# Patient Record
Sex: Female | Born: 1935 | Race: White | Hispanic: No | Smoking: Never smoker
Health system: Southern US, Community
[De-identification: ages and names within clinical notes are randomized; demographics above are authoritative.]

## PROBLEM LIST (undated history)

## (undated) DIAGNOSIS — IMO0002 Reserved for concepts with insufficient information to code with codable children: Secondary | ICD-10-CM

## (undated) DIAGNOSIS — N390 Urinary tract infection, site not specified: Secondary | ICD-10-CM

## (undated) DIAGNOSIS — I509 Heart failure, unspecified: Secondary | ICD-10-CM

## (undated) DIAGNOSIS — F0390 Unspecified dementia without behavioral disturbance: Secondary | ICD-10-CM

## (undated) DIAGNOSIS — I10 Essential (primary) hypertension: Secondary | ICD-10-CM

## (undated) DIAGNOSIS — E039 Hypothyroidism, unspecified: Secondary | ICD-10-CM

## (undated) DIAGNOSIS — I2510 Atherosclerotic heart disease of native coronary artery without angina pectoris: Secondary | ICD-10-CM

## (undated) DIAGNOSIS — D509 Iron deficiency anemia, unspecified: Secondary | ICD-10-CM

## (undated) DIAGNOSIS — E785 Hyperlipidemia, unspecified: Secondary | ICD-10-CM

## (undated) DIAGNOSIS — H9190 Unspecified hearing loss, unspecified ear: Secondary | ICD-10-CM

## (undated) DIAGNOSIS — I639 Cerebral infarction, unspecified: Secondary | ICD-10-CM

## (undated) DIAGNOSIS — E1165 Type 2 diabetes mellitus with hyperglycemia: Secondary | ICD-10-CM

## (undated) DIAGNOSIS — R32 Unspecified urinary incontinence: Secondary | ICD-10-CM

## (undated) DIAGNOSIS — M810 Age-related osteoporosis without current pathological fracture: Secondary | ICD-10-CM

## (undated) HISTORY — PX: EYE SURGERY: SHX253

## (undated) HISTORY — DX: Heart failure, unspecified: I50.9

## (undated) HISTORY — PX: BLADDER REPAIR: SHX76

## (undated) HISTORY — DX: Iron deficiency anemia, unspecified: D50.9

## (undated) HISTORY — PX: TONSILLECTOMY AND ADENOIDECTOMY: SUR1326

## (undated) HISTORY — PX: ABDOMINAL HYSTERECTOMY: SHX81

## (undated) HISTORY — PX: INNER EAR SURGERY: SHX679

---

## 1998-03-03 ENCOUNTER — Other Ambulatory Visit: Admission: RE | Admit: 1998-03-03 | Discharge: 1998-03-03 | Payer: Self-pay | Admitting: Family Medicine

## 1999-07-26 ENCOUNTER — Other Ambulatory Visit: Admission: RE | Admit: 1999-07-26 | Discharge: 1999-07-26 | Payer: Self-pay | Admitting: Family Medicine

## 2000-06-24 ENCOUNTER — Inpatient Hospital Stay (HOSPITAL_COMMUNITY): Admission: EM | Admit: 2000-06-24 | Discharge: 2000-06-26 | Payer: Self-pay | Admitting: Emergency Medicine

## 2000-06-24 ENCOUNTER — Encounter: Payer: Self-pay | Admitting: Internal Medicine

## 2000-12-03 ENCOUNTER — Encounter: Payer: Self-pay | Admitting: Otolaryngology

## 2000-12-03 ENCOUNTER — Encounter: Admission: RE | Admit: 2000-12-03 | Discharge: 2000-12-03 | Payer: Self-pay | Admitting: Otolaryngology

## 2001-09-03 ENCOUNTER — Other Ambulatory Visit: Admission: RE | Admit: 2001-09-03 | Discharge: 2001-09-03 | Payer: Self-pay | Admitting: Family Medicine

## 2002-03-12 ENCOUNTER — Encounter: Admission: RE | Admit: 2002-03-12 | Discharge: 2002-03-12 | Payer: Self-pay | Admitting: Surgery

## 2002-03-12 ENCOUNTER — Encounter: Payer: Self-pay | Admitting: Otolaryngology

## 2002-03-14 ENCOUNTER — Ambulatory Visit (HOSPITAL_BASED_OUTPATIENT_CLINIC_OR_DEPARTMENT_OTHER): Admission: RE | Admit: 2002-03-14 | Discharge: 2002-03-14 | Payer: Self-pay | Admitting: Otolaryngology

## 2002-03-14 ENCOUNTER — Encounter (INDEPENDENT_AMBULATORY_CARE_PROVIDER_SITE_OTHER): Payer: Self-pay | Admitting: Specialist

## 2002-09-11 ENCOUNTER — Emergency Department (HOSPITAL_COMMUNITY): Admission: EM | Admit: 2002-09-11 | Discharge: 2002-09-11 | Payer: Self-pay | Admitting: *Deleted

## 2002-11-24 ENCOUNTER — Encounter: Payer: Self-pay | Admitting: Family Medicine

## 2002-11-24 ENCOUNTER — Ambulatory Visit (HOSPITAL_COMMUNITY): Admission: RE | Admit: 2002-11-24 | Discharge: 2002-11-24 | Payer: Self-pay | Admitting: Family Medicine

## 2003-01-20 ENCOUNTER — Ambulatory Visit (HOSPITAL_COMMUNITY): Admission: RE | Admit: 2003-01-20 | Discharge: 2003-01-20 | Payer: Self-pay | Admitting: Family Medicine

## 2003-01-20 ENCOUNTER — Encounter: Payer: Self-pay | Admitting: Family Medicine

## 2003-08-24 ENCOUNTER — Other Ambulatory Visit: Admission: RE | Admit: 2003-08-24 | Discharge: 2003-08-24 | Payer: Self-pay | Admitting: Family Medicine

## 2004-07-25 ENCOUNTER — Ambulatory Visit (HOSPITAL_COMMUNITY): Admission: RE | Admit: 2004-07-25 | Discharge: 2004-07-25 | Payer: Self-pay | Admitting: Family Medicine

## 2004-08-02 ENCOUNTER — Ambulatory Visit (HOSPITAL_COMMUNITY): Admission: RE | Admit: 2004-08-02 | Discharge: 2004-08-02 | Payer: Self-pay | Admitting: Family Medicine

## 2004-09-12 ENCOUNTER — Other Ambulatory Visit: Admission: RE | Admit: 2004-09-12 | Discharge: 2004-09-12 | Payer: Self-pay | Admitting: Family Medicine

## 2004-10-12 ENCOUNTER — Ambulatory Visit (HOSPITAL_COMMUNITY): Admission: RE | Admit: 2004-10-12 | Discharge: 2004-10-12 | Payer: Self-pay | Admitting: General Surgery

## 2004-11-16 ENCOUNTER — Ambulatory Visit (HOSPITAL_COMMUNITY): Admission: RE | Admit: 2004-11-16 | Discharge: 2004-11-17 | Payer: Self-pay | Admitting: Family Medicine

## 2004-11-18 ENCOUNTER — Inpatient Hospital Stay (HOSPITAL_COMMUNITY): Admission: RE | Admit: 2004-11-18 | Discharge: 2004-11-19 | Payer: Self-pay | Admitting: Interventional Radiology

## 2004-11-22 ENCOUNTER — Ambulatory Visit (HOSPITAL_COMMUNITY): Admission: RE | Admit: 2004-11-22 | Discharge: 2004-11-22 | Payer: Self-pay | Admitting: Interventional Radiology

## 2005-04-18 ENCOUNTER — Encounter: Admission: RE | Admit: 2005-04-18 | Discharge: 2005-04-18 | Payer: Self-pay | Admitting: General Surgery

## 2005-05-12 ENCOUNTER — Ambulatory Visit: Payer: Self-pay

## 2005-09-13 ENCOUNTER — Encounter: Admission: RE | Admit: 2005-09-13 | Discharge: 2005-09-13 | Payer: Self-pay | Admitting: Family Medicine

## 2006-01-10 ENCOUNTER — Ambulatory Visit: Payer: Self-pay | Admitting: Cardiology

## 2006-01-16 ENCOUNTER — Ambulatory Visit: Payer: Self-pay

## 2006-09-14 ENCOUNTER — Encounter: Admission: RE | Admit: 2006-09-14 | Discharge: 2006-09-14 | Payer: Self-pay | Admitting: Family Medicine

## 2006-09-26 ENCOUNTER — Ambulatory Visit: Payer: Self-pay | Admitting: Cardiology

## 2007-01-01 ENCOUNTER — Other Ambulatory Visit: Admission: RE | Admit: 2007-01-01 | Discharge: 2007-01-01 | Payer: Self-pay | Admitting: Family Medicine

## 2007-03-27 ENCOUNTER — Ambulatory Visit: Payer: Self-pay | Admitting: Cardiology

## 2008-02-12 ENCOUNTER — Ambulatory Visit (HOSPITAL_COMMUNITY): Admission: RE | Admit: 2008-02-12 | Discharge: 2008-02-13 | Payer: Self-pay | Admitting: Urology

## 2008-02-14 ENCOUNTER — Emergency Department (HOSPITAL_COMMUNITY): Admission: EM | Admit: 2008-02-14 | Discharge: 2008-02-14 | Payer: Self-pay | Admitting: Emergency Medicine

## 2008-04-15 ENCOUNTER — Encounter: Admission: RE | Admit: 2008-04-15 | Discharge: 2008-04-15 | Payer: Self-pay | Admitting: Family Medicine

## 2008-08-25 ENCOUNTER — Ambulatory Visit (HOSPITAL_BASED_OUTPATIENT_CLINIC_OR_DEPARTMENT_OTHER): Admission: RE | Admit: 2008-08-25 | Discharge: 2008-08-25 | Payer: Self-pay | Admitting: Urology

## 2008-10-13 ENCOUNTER — Ambulatory Visit (HOSPITAL_BASED_OUTPATIENT_CLINIC_OR_DEPARTMENT_OTHER): Admission: RE | Admit: 2008-10-13 | Discharge: 2008-10-13 | Payer: Self-pay | Admitting: Urology

## 2009-02-07 ENCOUNTER — Inpatient Hospital Stay (HOSPITAL_COMMUNITY): Admission: EM | Admit: 2009-02-07 | Discharge: 2009-02-11 | Payer: Self-pay | Admitting: Emergency Medicine

## 2009-02-07 ENCOUNTER — Ambulatory Visit: Payer: Self-pay | Admitting: Internal Medicine

## 2009-04-05 ENCOUNTER — Encounter: Admission: RE | Admit: 2009-04-05 | Discharge: 2009-04-05 | Payer: Self-pay | Admitting: Otolaryngology

## 2009-04-06 ENCOUNTER — Ambulatory Visit (HOSPITAL_BASED_OUTPATIENT_CLINIC_OR_DEPARTMENT_OTHER): Admission: RE | Admit: 2009-04-06 | Discharge: 2009-04-06 | Payer: Self-pay | Admitting: Otolaryngology

## 2009-04-16 ENCOUNTER — Encounter: Admission: RE | Admit: 2009-04-16 | Discharge: 2009-04-16 | Payer: Self-pay | Admitting: Family Medicine

## 2009-10-06 LAB — HM DEXA SCAN

## 2010-04-21 ENCOUNTER — Encounter: Admission: RE | Admit: 2010-04-21 | Discharge: 2010-04-21 | Payer: Self-pay | Admitting: Family Medicine

## 2011-02-01 LAB — HM PAP SMEAR

## 2011-02-02 ENCOUNTER — Encounter: Payer: Self-pay | Admitting: Nurse Practitioner

## 2011-02-02 DIAGNOSIS — E785 Hyperlipidemia, unspecified: Secondary | ICD-10-CM | POA: Insufficient documentation

## 2011-02-02 DIAGNOSIS — E039 Hypothyroidism, unspecified: Secondary | ICD-10-CM

## 2011-02-02 DIAGNOSIS — M810 Age-related osteoporosis without current pathological fracture: Secondary | ICD-10-CM | POA: Insufficient documentation

## 2011-02-02 DIAGNOSIS — N393 Stress incontinence (female) (male): Secondary | ICD-10-CM | POA: Insufficient documentation

## 2011-02-02 DIAGNOSIS — I509 Heart failure, unspecified: Secondary | ICD-10-CM

## 2011-02-02 DIAGNOSIS — E876 Hypokalemia: Secondary | ICD-10-CM | POA: Insufficient documentation

## 2011-02-02 DIAGNOSIS — I10 Essential (primary) hypertension: Secondary | ICD-10-CM | POA: Insufficient documentation

## 2011-02-02 DIAGNOSIS — M1990 Unspecified osteoarthritis, unspecified site: Secondary | ICD-10-CM | POA: Insufficient documentation

## 2011-02-21 LAB — BASIC METABOLIC PANEL
BUN: 11 mg/dL (ref 6–23)
Calcium: 9 mg/dL (ref 8.4–10.5)
Creatinine, Ser: 0.74 mg/dL (ref 0.4–1.2)
GFR calc Af Amer: >60 mL/min (ref >60)
Glucose, Bld: 205 mg/dL — ABNORMAL HIGH (ref 70–99)
Sodium: 138 mEq/L (ref 135–145)

## 2011-02-21 LAB — GLUCOSE, CAPILLARY
Glucose-Capillary: 182 mg/dL — ABNORMAL HIGH (ref 70–99)
Glucose-Capillary: 225 mg/dL — ABNORMAL HIGH (ref 70–99)

## 2011-02-22 LAB — CBC
Hemoglobin: 10.1 g/dL — ABNORMAL LOW (ref 12.0–15.0)
RBC: 3.05 MIL/uL — ABNORMAL LOW (ref 3.87–5.11)
RDW: 12.7 % (ref 11.5–15.5)

## 2011-02-22 LAB — BASIC METABOLIC PANEL
Calcium: 7.6 mg/dL — ABNORMAL LOW (ref 8.4–10.5)
Creatinine, Ser: 0.8 mg/dL (ref 0.4–1.2)
GFR calc Af Amer: >60 mL/min (ref >60)
GFR calc non Af Amer: >60 mL/min (ref >60)
Sodium: 134 mEq/L — ABNORMAL LOW (ref 135–145)

## 2011-02-22 LAB — GLUCOSE, CAPILLARY
Glucose-Capillary: 199 mg/dL — ABNORMAL HIGH (ref 70–99)
Glucose-Capillary: 94 mg/dL (ref 70–99)

## 2011-02-22 LAB — MAGNESIUM: Magnesium: 2 mg/dL (ref 1.5–2.5)

## 2011-02-23 LAB — LIPID PANEL
LDL Cholesterol: 45 mg/dL (ref 0–99)
Triglycerides: 113 mg/dL (ref <150)
VLDL: 23 mg/dL (ref 0–40)

## 2011-02-23 LAB — MAGNESIUM: Magnesium: 1.2 mg/dL — ABNORMAL LOW (ref 1.5–2.5)

## 2011-02-23 LAB — BASIC METABOLIC PANEL
BUN: 17 mg/dL (ref 6–23)
BUN: 22 mg/dL (ref 6–23)
CO2: 26 mEq/L (ref 19–32)
CO2: 28 mEq/L (ref 19–32)
Calcium: 8.2 mg/dL — ABNORMAL LOW (ref 8.4–10.5)
Calcium: 8.5 mg/dL (ref 8.4–10.5)
Chloride: 100 mEq/L (ref 96–112)
Chloride: 96 mEq/L (ref 96–112)
Chloride: 99 mEq/L (ref 96–112)
Creatinine, Ser: 0.71 mg/dL (ref 0.4–1.2)
Creatinine, Ser: 0.84 mg/dL (ref 0.4–1.2)
Creatinine, Ser: 0.97 mg/dL (ref 0.4–1.2)
GFR calc Af Amer: >60 mL/min (ref >60)
GFR calc Af Amer: >60 mL/min (ref >60)
GFR calc Af Amer: >60 mL/min (ref >60)
GFR calc non Af Amer: >60 mL/min (ref >60)
Glucose, Bld: 173 mg/dL — ABNORMAL HIGH (ref 70–99)
Potassium: 3.1 mEq/L — ABNORMAL LOW (ref 3.5–5.1)
Potassium: 4 mEq/L (ref 3.5–5.1)
Sodium: 133 mEq/L — ABNORMAL LOW (ref 135–145)
Sodium: 137 mEq/L (ref 135–145)

## 2011-02-23 LAB — RETICULOCYTES
RBC.: 3.64 MIL/uL — ABNORMAL LOW (ref 3.87–5.11)
Retic Count, Absolute: 36.4 10*3/uL (ref 19.0–186.0)

## 2011-02-23 LAB — URINALYSIS, ROUTINE W REFLEX MICROSCOPIC
Glucose, UA: 250 mg/dL — AB
Hgb urine dipstick: NEGATIVE
Specific Gravity, Urine: 1.025 (ref 1.005–1.030)
Urobilinogen, UA: 1 mg/dL (ref 0.0–1.0)

## 2011-02-23 LAB — COMPREHENSIVE METABOLIC PANEL
AST: 21 U/L (ref 0–37)
Albumin: 2.8 g/dL — ABNORMAL LOW (ref 3.5–5.2)
Alkaline Phosphatase: 38 U/L — ABNORMAL LOW (ref 39–117)
BUN: 24 mg/dL — ABNORMAL HIGH (ref 6–23)
Chloride: 100 mEq/L (ref 96–112)
GFR calc Af Amer: >60 mL/min (ref >60)
Potassium: 3.1 mEq/L — ABNORMAL LOW (ref 3.5–5.1)
Total Bilirubin: 0.7 mg/dL (ref 0.3–1.2)
Total Protein: 6.1 g/dL (ref 6.0–8.3)

## 2011-02-23 LAB — IRON AND TIBC
Iron: 22 ug/dL — ABNORMAL LOW (ref 42–135)
TIBC: 266 ug/dL (ref 250–470)

## 2011-02-23 LAB — URINE MICROSCOPIC-ADD ON

## 2011-02-23 LAB — DIFFERENTIAL
Basophils Absolute: 0 10*3/uL (ref 0.0–0.1)
Lymphocytes Relative: 20 % (ref 12–46)
Lymphs Abs: 2.8 10*3/uL (ref 0.7–4.0)
Neutro Abs: 9.5 10*3/uL — ABNORMAL HIGH (ref 1.7–7.7)
Neutrophils Relative %: 69 % (ref 43–77)

## 2011-02-23 LAB — GLUCOSE, CAPILLARY
Glucose-Capillary: 115 mg/dL — ABNORMAL HIGH (ref 70–99)
Glucose-Capillary: 171 mg/dL — ABNORMAL HIGH (ref 70–99)
Glucose-Capillary: 180 mg/dL — ABNORMAL HIGH (ref 70–99)
Glucose-Capillary: 184 mg/dL — ABNORMAL HIGH (ref 70–99)
Glucose-Capillary: 185 mg/dL — ABNORMAL HIGH (ref 70–99)
Glucose-Capillary: 205 mg/dL — ABNORMAL HIGH (ref 70–99)
Glucose-Capillary: 235 mg/dL — ABNORMAL HIGH (ref 70–99)
Glucose-Capillary: 252 mg/dL — ABNORMAL HIGH (ref 70–99)
Glucose-Capillary: 274 mg/dL — ABNORMAL HIGH (ref 70–99)

## 2011-02-23 LAB — CULTURE, BLOOD (ROUTINE X 2)
Culture: NO GROWTH
Culture: NO GROWTH

## 2011-02-23 LAB — CBC
HCT: 33.5 % — ABNORMAL LOW (ref 36.0–46.0)
HCT: 38.3 % (ref 36.0–46.0)
MCHC: 34.9 g/dL (ref 30.0–36.0)
MCV: 97.2 fL (ref 78.0–100.0)
MCV: 99.5 fL (ref 78.0–100.0)
Platelets: 162 10*3/uL (ref 150–400)
Platelets: 190 10*3/uL (ref 150–400)
Platelets: 221 10*3/uL (ref 150–400)
RBC: 3.38 MIL/uL — ABNORMAL LOW (ref 3.87–5.11)
RBC: 3.47 MIL/uL — ABNORMAL LOW (ref 3.87–5.11)
RDW: 12.5 % (ref 11.5–15.5)
RDW: 12.6 % (ref 11.5–15.5)
WBC: 11 10*3/uL — ABNORMAL HIGH (ref 4.0–10.5)
WBC: 13.8 10*3/uL — ABNORMAL HIGH (ref 4.0–10.5)
WBC: 8.7 10*3/uL (ref 4.0–10.5)

## 2011-02-23 LAB — HEMOGLOBIN A1C: Mean Plasma Glucose: 154 mg/dL

## 2011-02-23 LAB — TSH: TSH: 0.597 u[IU]/mL (ref 0.350–4.500)

## 2011-02-23 LAB — POCT I-STAT, CHEM 8
BUN: 22 mg/dL (ref 6–23)
Chloride: 94 mEq/L — ABNORMAL LOW (ref 96–112)
Creatinine, Ser: 1.3 mg/dL — ABNORMAL HIGH (ref 0.4–1.2)
Potassium: 3 mEq/L — ABNORMAL LOW (ref 3.5–5.1)
Sodium: 138 mEq/L (ref 135–145)

## 2011-02-23 LAB — FERRITIN: Ferritin: 188 ng/mL (ref 10–291)

## 2011-02-23 LAB — VITAMIN B12: Vitamin B-12: 254 pg/mL (ref 211–911)

## 2011-02-23 LAB — METHYLMALONIC ACID, SERUM: Methylmalonic Acid, Quantitative: 238 nmol/L (ref 87–318)

## 2011-02-23 LAB — FOLATE: Folate: >20 ng/mL

## 2011-03-28 NOTE — Op Note (Signed)
Jamie Cochran, Jamie Cochran          ACCOUNT NO.:  [id number]   MEDICAL RECORD NO.:  [id number]          PATIENT TYPE:  OIB   LOCATION:  1408                         FACILITY:  [facility]   PHYSICIAN:  Excell Seltzer. Annabell Howells, M.D.    DATE OF BIRTH:  1930-08-29   DATE OF PROCEDURE:  02/12/2008  DATE OF DISCHARGE:                               OPERATIVE REPORT   PROCEDURE:  1. Lysis of vaginal adhesion.  2. Cystoscopy with unroofing of right ectopic ureterocele.  3. Right retrograde pyelogram with interpretation.  4. SPARC sling.   PREOPERATIVE DIAGNOSIS:  Stress urinary incontinence.   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   SURGEON:  Dr. Bjorn Pippin.   ANESTHESIA:  General.   DRAIN:  16 French Foley catheter.   SPECIMEN:  None.   COMPLICATIONS:  None.   INDICATIONS:  Jamie Cochran is a 77 year old white female with stress  urinary incontinence and prior sling type procedure with bone anchors,  very likely a Vesica procedure in the past.  She has recurrent stress  incontinence and is to undergo SPARC sling.   FINDINGS AND PROCEDURE:  The patient was taken to the operating room  where general anesthetic was induced.  She was placed in lithotomy  position.  Her perineum and genitalia were prepped Betadine solution and  she was draped in the usual sterile fashion.  She leaked urine  continuously after induction of anesthesia.  Once she was prepped and  draped, inspection revealed a dense band like adhesion from the anterior  vaginal wall at the level of the mid urethra to the posterior vaginal  wall.  This was approximately a centimeter to a centimeter and a half in  diameter of the vaginal mucosa.  This was clamped with a hemostat and  divided with scissors.  Hemostasis was achieved with the Bovie.  At the  end of the case, the posterior adhesion was trimmed back further  and  oversewn with a 3-0 Vicryl suture to remove the redundant tag.   Once this had been performed, the anterior vaginal incision was extended  from the cut edge of the adhesion more approximately after injection of  1% lidocaine with epinephrine.  The mucosa was elevated laterally on  each side to the level of the pubourethral fascia.  The urethra was  palpated with assistance from the 16-French Foley that was placed at the  beginning of the procedure.  Because of the unusual anatomy and the  adhesion, I elected to perform cystoscopy performed proceeding with the  sling.   Cystoscopy was performed with both the 12 and 70 degrees lenses with the  22-French scope.  Examination revealed a relatively short urethra and at  the level of the sphincter at approximately the 7 o'clock position,  there was an ostium.  Originally, I thought this may be related to her  prior sling procedures.  A 6-French open-end catheter was passed up into  the ostium and advanced quite easily into what appeared to a duplicated  ureter.  Inspection  of the remainder of the bladder revealed mild  trabeculation.  There were normally located ureteral orifices in both  trigone but on the right there was a sac like bulge caudal to the  normally located orifice that was associated with the ureteral ostium or  meatus at the level of the sphincter that was felt to represent an  ectopic ureterocele.  No evidence of duplication was noted on the left.  Once the open-end catheter was in good position, endoscopic scissors  were used to unroof the ureterocele with the thought that by allowing  the ureter to empty more directly into the bladder instead of at the  sphincter there would be less likelihood of incontinence.   Once the unroofing procedure been performed and the edges fulgurated  lightly with a Bugbee for hemostasis, a C-arm was brought onto the field  and contrast was instilled.   Fluoroscopy revealed a slightly  blunted but otherwise unremarkable upper  pole collecting system with no evidence of filling defects and an  unremarkable ureter.  The open-end catheter was then removed.   At this point I proceeded with the sling procedure.  Stab wounds were  made 2 cm lateral to the midline on each side at the superior margin of  the pubis.  The [facility] trocars were passed through the incision and  walked along the back in the pubis until they could be felt with a  finger in the vaginal incision.  Care was taken to protect the urethra  on each side as the trocars were passed.  Once both trocars were passed,  repeat cystoscopy was performed.  No evidence of bladder wall or  urethral injury was identified.  The [facility] mesh was then secured to the  trocars and pulled up into the abdominal incisions and cystoscopy was  repeated once again without evidence of bladder wall injury.  The Foley  catheter was reinserted and the abdominal ends of the sheath were  trimmed and the sheath was then removed with a small right-angle clamp  between the urethra and the sling material.  Once both sheaths were  removed, the right-angle clamp confirmed that good position of the sling  material at the mid urethral level without undue tension.  The anterior  vaginal wall was then closed with a running locked 3-0 Vicryl and a 2-  inch Iodoform vaginal pack was placed the abdominal ends of the sling  mesh were then trimmed to allow them to drop subcutaneously and the  incisions were closed with a Dermabond after being cleaned and dried.  At this point the Foley catheter was placed to straight drainage.  The  patient was taken down from lithotomy position.  Her anesthetic was  reversed.  She was moved to the recovery room in stable condition.  There were no complications.      Excell Seltzer. Annabell Howells, M.D.  Electronically Signed     JJW/MEDQ  D:  02/12/2008  T:  02/13/2008  Job:  [id number]   cc:   Jamie Cochran, M.D.  Fax:  [phone]

## 2011-03-28 NOTE — Op Note (Signed)
Jamie Cochran, Jamie Cochran          ACCOUNT NO.:  [id number]   MEDICAL RECORD NO.:  [id number]          PATIENT TYPE:  AMB   LOCATION:  DSC                          FACILITY:  [facility]   PHYSICIAN:  Christopher E. Ezzard Standing, M.D.DATE OF BIRTH:  October 03, 1930   DATE OF PROCEDURE:  04/06/2009  DATE OF DISCHARGE:                               OPERATIVE REPORT   PREOPERATIVE DIAGNOSIS:  Right ear with 40 dB conductive hearing loss  status post previous tympanomastoidectomy.   POSTOPERATIVE DIAGNOSIS:  Right ear with 40 dB conductive hearing loss  status post previous tympanomastoidectomy.   OPERATION:  Right tympanoplasty with placement of poor prosthesis that  would be [employer] Layla Barter PORP prosthesis.   SURGEON:  Kristine Garbe. Ezzard Standing, MD   ANESTHESIA:  General endotracheal.   COMPLICATIONS:  None.   CLINICAL NOTE:  Jamie Cochran is a 77 year old female who has had  previous tympanomastoidectomy over 20 years ago.  Ever since the  surgery, she has had a large conductive hearing loss.  She had  exploration about 8 years ago and at that time was found to have a large  amount of middle ear scar tissue and granulation tissue, but apparently  had a persistent stapes superstructure and had a cartilage graft placed  in 8 years ago.  She has continued to have significant conductive  hearing loss in the right ear with underlying sensorineural hearing loss  in both ears.  Because of the degree of conductive hearing loss at 40  dB, she is taken to the operating room at this time for repeat  tympanoplasty and ossicular reconstruction.  There is really no evidence  of cholesteatoma.  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   DESCRIPTION OF PROCEDURE:  After adequate endotracheal anesthesia, the  ear was prepped with Betadine solution and draped with sterile towels.  The ear canal was injected with Xylocaine with epinephrine  for  hemostasis.  A posterior based tympanomeatal flap was then elevated over  the facial ridge down to the small mastoid bowl.  Elevation was carried  down to the annulus which was elevated and the middle ear space was  entered carefully with a sharp pick.  There was a large amount of  adhesion and dense scar tissue within the middle ear space.  Inferiorly,  there was a small pocket of the air containing space that was carefully  opened up anteriorly superiorly.  Most of the anterior superior, there  was an area that was loculated out and had some thick mucoid fluid in  it.  Dissection was then carried out posterior superiorly and the  cartilage graft was identified.  The TM was elevated off the cartilage  graft.  The TM was very thickened.  After releasing the dense adhesions  around the cartilage graft, the cartilage graft was then carefully  manipulated off the stapes superstructure which was still intact.  Again, there was a large amount of dense tissue around the stapes  superstructure down to the oval window and this was a carefully  dissected out between the facial  nerve and the stapes superstructure  posterior superiorly and anteriorly inferiorly between the promontory  and the stapes superstructure.  There was some mucoid fluid after  opening up some of this area around the stapes superstructure that was  loculated off.  After cleaning off the superior aspect of the stapes  superstructure, it was elected to use a [employer] fully cannulated  Mariam Dollar PORP prosthesis.  Prior to positioning the prosthesis, a T-  tube was placed in the anterior TM perforation in continuity with the  loculated areas anteriorly superiorly where the mucoid fluid was  aspirated.  The T-tube had to be removed prior to final closure.  Because of inconsistent count with the cotton balls, the middle ear was  carefully evaluated for any residual cotton balls which were not  identified.  The T-tube was  repositioned.  The Mariam Dollar PORP  prosthesis was then placed over the stapes superstructure.  The very  thickened tympanomeatal flap was brought back down over the [place]  prosthesis which was in good position.  Middle ear space was packed with  Gelfoam, soaked in Ciprodex prior to repositioning the tympanomeatal  flap.  The ear canal and mastoid area was then packed with Gelfoam  soaked in Ciprodex.  A cotton ball and Band-Aid were placed in the ear  canal to secure the packing.  This completed the procedure.  Jamie Cochran was  awoke from anesthesia and transferred to recovery and postop doing well.  Of note, she received 1 gram of Ancef IV preoperatively.   DISPOSITION:  Jamie Cochran is discharged home later this morning on Keflex  500 mg b.i.d. for 1 week, Tylenol and Vicodin p.r.n. pain.  However,  follow up office in 1 week for recheck.  Instructed to leave the cotton  ball in the ear canal tomorrow morning and then change the cotton ball  and keep the ear canal dry.           ______________________________  Kristine Garbe. Ezzard Standing, M.D.     CEN/MEDQ  D:  04/06/2009  T:  04/07/2009  Job:  [id number]

## 2011-03-28 NOTE — Op Note (Signed)
NAMEHOLLACE, Jamie Cochran          ACCOUNT NO.:  [id number]   MEDICAL RECORD NO.:  [id number]          PATIENT TYPE:  AMB   LOCATION:  NESC                         FACILITY:  [facility]   PHYSICIAN:  Excell Seltzer. Annabell Howells, M.D.    DATE OF BIRTH:  28-Mar-1930   DATE OF PROCEDURE:  08/25/2008  DATE OF DISCHARGE:                               OPERATIVE REPORT   PROCEDURE:  Cystoscopy with suburethral injection of Macroplastique.   PREOPERATIVE DIAGNOSIS:  Stress urinary incontinence.   POSTOPERATIVE DIAGNOSIS:  Stress urinary incontinence.   SURGEON:  Dr. Bjorn Pippin.   ANESTHESIA:  General.   DRAINS:  None.   COMPLICATIONS:  None.   INDICATIONS:  Jamie Cochran is a 77 year old white female who is  approximately 6-1/2 months out from a sling procedure.  She had an  ectopic ureter that was unroofed at the time of sling.  She has urge  symptoms without stress symptoms but has a Valsalva leak point pressure  of 60-80 cm of water with a hypersensitive unstable bladder with a weak  detrusor and mild elevation of residual urine.  It was felt that the  most appropriate initial treatment for her low leak point pressure would  be a Macroplastique bulking agent injection as that is less likely to  obstruct in a repeat sling.   FINDINGS AND PROCEDURE:  The patient was then given Cipro.  She was  taken to operating room where general anesthetic was induced.  She was  placed in lithotomy position.  Her perineum and genitalia were prepped  with Betadine solution.  She was draped in the usual sterile fashion.  Cystoscopy was performed with the 22-French scope and 12-degree lens.  Examination revealed a somewhat patulous urethra.  The previously un-  roofed ureterocele was noted on the right with a second ureteral orifice  superolaterally.  The left ureteral orifice was single and normal.  There was some scarring at 6 o'clock at the bladder neck from the prior  procedure.   Once cystoscopy had been  performed, the injection scope with [facility]  handle and 30-degree lens was then passed.  This was fitted with the  primed transurethral needle with the Macroplastique syringe.  An initial  injection site at 6 o'clock was chosen and the needle was advanced to  what was felt to be the appropriate level.  However, within about a mL  of injection, she began to extravasate through the mucosa.   The initial needle site was retracted and a second site was placed at 7  o'clock with care being taken to place the needle more deeply before  advancing it toward the bladder neck.  The remaining 1.5 mL was then  injected with excellent elevation of the mucosa without extravasation.  A second syringe was then injected at 5 o'clock for about 1.5 mL and  then at 2 o'clock with 1 mL.  With this material at each injection, the  needle was left in place 30 seconds after completion of the injection.  At this point, the patient's urethra appeared well-coapted.  Pressure on  the bladder did produce some residual leakage but under  anesthesia with  a full bladder, the significance of this is not clear.   A 12-French red rubber catheter was then used to drain the bladder of  400 mL of slightly bloody urine.  The patient was taken down from  lithotomy position.  Her anesthetic was reversed.  She was moved to the  recovery room in stable condition.  There were no complications.  She  will be maintained on Cipro for the next week and will follow-up with me  in two weeks.      Excell Seltzer. Annabell Howells, M.D.  Electronically Signed     JJW/MEDQ  D:  08/25/2008  T:  08/25/2008  Job:  [id number]   cc:   Ernestina Penna, M.D.  Fax: [phone]

## 2011-03-28 NOTE — Op Note (Signed)
NAMECHASTITY, Jamie Cochran          ACCOUNT NO.:  [id number]   MEDICAL RECORD NO.:  [id number]          PATIENT TYPE:  AMB   LOCATION:  NESC                         FACILITY:  [facility]   PHYSICIAN:  Excell Seltzer. Annabell Howells, M.D.    DATE OF BIRTH:  04/18/1930   DATE OF PROCEDURE:  10/13/2008  DATE OF DISCHARGE:                               OPERATIVE REPORT   ATTENDING SURGEON:  Excell Seltzer. Annabell Howells, M.D.   ASSISTANT:  Dr. Allena Katz.   PREOPERATIVE DIAGNOSIS:  Mixed urinary incontinence.   POSTOPERATIVE DIAGNOSES:  Mixed urinary incontinence.   PROCEDURE:  1. Pan cystourethroscopy.  2. Suburethral injection of macro plasty.   INDICATIONS:  This is 77 year old white female with a history of mixed  urinary incontinence.  Recently she had urodynamics that showed leak  point pressures in the range of 60 to 80.  She ultimately underwent a  urethral sling and failed this.  Subsequently she underwent macro plasty  with good initial results, however, persistent leakage and as such is  here today for repeat bulking agent.  Risks and benefits of procedure  were discussed with her preoperatively.   PROCEDURE IN DETAIL:  The patient was brought back to the operating room  and after the successful induction of LMA anesthetic she was placed in  dorsal lithotomy position and all pressure points were padded  appropriately.  Using the injection scope pan cystourethroscopy was  completed.  The urethra appeared patulous.  On entering the patient's  bladder, the unroofed ectopic ureter could be seen on the right side.  The left ureter and ureteral orifice appeared normal.  Bladder too  appeared normal with no evidence of anomaly.   At this point a needle was primed with the macro plasty needle and  injection site was chosen at the 6 o'clock position.  The needle was  first inserted the 45 angle and then flattened out per protocol.  Three  3 mL of macro plasty were injected at the 6 o'clock position.  At this  point we  injected in the same fashion at the 4 and 8 o'clock positions,  noting that at the completion of the procedure there was good coaptation  of the urethral mucosa.   The bladder was drained with a 12-French red rubber catheter and the  procedure was ended.  Please note Dr. Annabell Howells was the responsible surgeon  and present for the entire case.  Estimated blood loss minimal.  Urine  output unrecorded.   DRAINS:  None.   SPECIMENS:  None.   DISPOSITION:  The patient will be brought to the PACU for further care.      Delman Kitten, MD      Excell Seltzer. Annabell Howells, M.D.  Electronically Signed    DW/MEDQ  D:  10/13/2008  T:  10/13/2008  Job:  [id number]

## 2011-03-28 NOTE — Assessment & Plan Note (Signed)
[facility]                            CARDIOLOGY OFFICE NOTE   Jamie Cochran, Jamie Cochran                 MRN:          [id number]  DATE:03/27/2007                            DOB:          [birth year]    PRIMARY CARE PHYSICIAN:  Bennie Pierini, Nurse Practitioner.   REASON FOR PRESENTATION:  Evaluate patient with hypertension.   HISTORY OF PRESENT ILLNESS:  Patient returns for followup.  At the last  visit, she did increase her lisinopril to 20 mg.  Since I have seen her,  she also had Coreg added.  She has been doing well.  Her blood pressure  seems to be controlled, as described below.  She has been having no  symptoms related to these medications.  Particularly, she is not having  any light-headedness, presyncope or syncope.  She denies any chest pain  or shortness of breath.   PAST MEDICAL HISTORY:  Hypertension, nonobstructive coronary disease  (40% first diagonal stenosis, 50% obtuse marginal stenosis), type 2  diabetes mellitus, gastroesophageal reflux disease, osteoporosis,  hypothyroidism, hyperlipidemia, tonsillectomy, history of cesarean  section, hysterectomy, history of bladder repair.   ALLERGIES:  SULFA.   MEDICATIONS:  1. Levothyroxine 75 micrograms daily.  2. Actonel 35 mg a week.  3. Norvasc 10 mg daily.  4. Niaspan 1000 mg q.p.m.  5. Tricor 48 mg daily.  6. Lisinopril 20 mg daily.  7. Coreg 12.5 mg b.i.d.  8. Clonidine 0.1 mg b.i.d.  9. Glimepiride 4 mg daily.  10.Hydrochlorothiazide 25 mg daily.  11.Klor-Con 10 mg b.i.d.  12.Aspirin 81 mg daily.  13.Prilosec.   REVIEW OF SYSTEMS:  As stated in the HPI, and otherwise negative for  other systems.   PHYSICAL EXAMINATION:  Patient is in no distress.  Blood pressure  138/72, heart rate 65 and regular, weight 153 pounds.  Body mass index  33.  NECK:  No jugular venous distention, wave form within normal limits.  Carotid upstroke brisk and symmetric.  LUNGS:   Clear to auscultation bilaterally.  HEART:  PMI not displaced or sustained.  S1 and S2 within normal limits.  No S3, no S4, no clicks, no rubs, no murmurs.  ABDOMEN:  Obese, positive bowel sounds, normal in frequency and pitch.  No bruits, rebound, guarding, no midline pulsatile mass, no  organomegaly.  SKIN:  No rashes, no nodules.  EXTREMITIES:  2+ pulses, no edema.  NEUROLOGIC:  Grossly intact.   EKG:  Sinus rhythm, rate 65, axis within normal limits, intervals within  normal limits, poor anterior R-wave progression.  No acute STT-wave  changes.   ASSESSMENT AND PLAN:  1. Hypertension:  Blood pressure is currently controlled and it is      being followed very closely by her primary care-givers.  No further      change is made to her regimen.  I do note that, besides the      Norvasc, she has had clonidine added and hydrochlorothiazide      increased.  I did discuss with her therapeutic lifestyle changes,      to include weight-loss, which could help with her blood  pressure      control.  2. Nonobstructive coronary disease:  She is having no symptoms related      to this.  She will continue with primary risk reduction.  3. Obesity:  We discussed the need to exercise and to diet to lose      weight.  4. Followup:  She can come back to this clinic as needed.     Rollene Rotunda, MD, [facility]     JH/MedQ  DD: 03/27/2007  DT: 03/27/2007  Job #: [id number]   cc:   [facility] [place].P.

## 2011-03-31 NOTE — Op Note (Signed)
NAMEJAHMIA, Jamie Cochran          ACCOUNT NO.:  [id number]   MEDICAL RECORD NO.:  [id number]          PATIENT TYPE:  INP   LOCATION:  2854                         FACILITY:  [facility]   PHYSICIAN:  Larina Earthly, M.D.    DATE OF BIRTH:  1930-10-15   DATE OF PROCEDURE:  11/18/2004  DATE OF DISCHARGE:                                 OPERATIVE REPORT   PREOPERATIVE DIAGNOSIS:  Right femoral false aneurysm.   POSTOPERATIVE DIAGNOSIS:  Right femoral false aneurysm.   PROCEDURE:  Right groin exploration and repair of right femoral artery.   SURGEON:  Larina Earthly, M.D.   ASSISTANT:  Pecola Leisure, P.A.-C.   ANESTHESIA:  General endotracheal.   COMPLICATIONS:  None.   DISPOSITION:  To recovery room stable.   INDICATIONS FOR PROCEDURE:  This is a 77 year old white female who is 48  hours status post renal arteriogram via right femoral approach.  Patient was  kept in the hospital for 24 hours and was discharged on Thursday.  Patient  presents today with increasing swelling and bruising and pain in her right  groin.  Duplex revealed a false aneurysm with apparent widened neck.  It is  recommended that she undergo exploration for repair.   PROCEDURE IN DETAIL:  The patient was taken to the operating room and placed  in the supine position, where the area of the left groin was prepped and  draped in the usual sterile fashion.  An incision was made above the  inguinal crease, carried down to isolate the hematoma.  The hematoma was  entered, and fresh arterial blood was encountered.  This was controlled with  digital pressure.  The artery was mobilized further proximally and distally  through the hematoma.  The patient was given 5000 units of intravenous  heparin.  After adequate circulation time, the common femoral artery was  occluded proximal and distal to the arterial puncture site.  The arterial  puncture was closed with a figure-of-eight 5-0 Prolene suture.  Clamps were  removed,  and hemostasis was encountered.  The wounds were irrigated with  saline.  The hematoma was debrided.  A 10 flat Jackson-Pratt drain was  brought through a separate stab wound and placed in the base of the wound.  The patient was given 50 mg of Protamine to reverse  heparin.  The wound was irrigated with saline.  Hemostasis with  electrocautery.  The wounds were closed with 2-0 Vicryl in several layers in  the subcutaneous tissue, and the skin was closed with a subcuticular Vicryl  stitch.  Sterile dressing was applied.  The patient was taken to the  recovery room in stable condition.                                               ______________________________  Larina Earthly, M.D.  Electronically Signed 12/05/2004 01:54:34pm EST    TFE/MEDQ  D:  11/18/2004  T:  11/18/2004  Job:  [id number]   cc:   Art  Joseph Art, M.D.  [address]  [place], [place] [address]   Jodi Marble. Fredia Sorrow, M.D.  [address]  [place]  [place] [address]  Fax: [phone]

## 2011-03-31 NOTE — Assessment & Plan Note (Signed)
HEALTHCARE                              CARDIOLOGY OFFICE NOTE   MADELLYN, DENIO                 MRN:          [id number]  DATE:09/26/2006                            DOB:          1930-08-07    PRIMARY CARE PHYSICIAN:  Dr. Bennie Pierini.   REASON FOR PRESENTATION:  Patient with hypertension.   HISTORY OF PRESENT ILLNESS:  Patient returns for followup.  She has had some  problems with her blood pressure being elevated on the last few visits.  Last time she was seen in the office with some chest discomfort and had a  stress perfusion study in March.  This demonstrated her EF to be 78% with no  ischemia or infarct.   She has had no chest discomfort, neck discomfort, arm discomfort, activity-  induced nausea, vomiting or excessive diaphoresis.  She has had no  palpitations, presyncope or syncope.  She is noticing some increased  swelling in her feet at the end of the day.  However, she is visiting her  husband at the nursing home all day long, keeping her feet down.  She is  avoiding salt, and her weight has been stable.   PAST MEDICAL HISTORY:  1. Hypertension.  2. Nonobstructive coronary disease (40% 1st diagonal stenosis, 50% obtuse      marginal stenosis.)  3. Type 2 diabetes mellitus.  4. Gastroesophageal reflux disease.  5. Osteoporosis.  6. Hypothyroidism.  7. Hyperlipidemia.  8. Tonsillectomy.  9. History of cesarean section.  10.Hysterectomy.  11.History of bladder repair.   ALLERGIES:  SULFA.   MEDICATIONS:  1. Hydrochlorothiazide 25 mg daily.  2. Levothyroxine 75 mcg q.d.  3. Lisinopril 10 mg q.d.  4. Actonel 35 mg a week.  5. Norvasc 10 mg q.d.  6. Niaspan 1000 q.d.  7. Tricor 48 mg q.d.  8. Ascriptin.   REVIEW OF SYMPTOMS:  As stated in the HPI, otherwise negative for other  systems.   PHYSICAL EXAMINATION:  Patient is in no distress.  Weight 156 pounds.  Blood  pressure 160/80.  Heart rate 73 and  regular.  HEENT:  Eyes unremarkable.  Pupils equal, round and reactive to light.  Fundi not visualized.  NECK:  No jugular venous distention at 45 degrees.  Carotid upstroke brisk  and symmetrical.  No bruits.  No thyromegaly.  LYMPHATICS:  No adenopathy.  LUNGS:  Clear to auscultation bilaterally.  BACK:  No costovertebral angle tenderness.  CHEST:  Unremarkable.  HEART:  PMI not displaced or sustained.  S1 and S2 within normal limits.  No  S3, no S4, no murmurs.  ABDOMEN:  Flat.  Positive bowel sounds.  Normal in frequency and pitch.  No  bruits or rebound.  No guarding.  No masses or organomegaly.  SKIN:  No rashes.  No nodules.  EXTREMITIES:  Two plus pulses.  No edema, cyanosis or clubbing.  NEUROLOGIC:  Oriented to person, place and time.  Cranial nerves II through  XII grossly intact.  Motor grossly intact.   EKG:  Sinus rhythm.  Rate 73.  Axis within normal limits.  Intervals within  normal limits.  No acute ST-T wave changes.   ASSESSMENT AND PLAN:  1. Hypertension.  Blood pressure is still elevated.  I am going to      increase her Lisinopril to 20 mg a day.  She was give written      instructions to get a BMET in 2 weeks.  She will continue the other      medications as listed.  2. Chest discomfort.  She had negative stress perfusion study in the      spring, and has had no further pain.  She had nonobstructive coronary      disease some while ago.  No further cardiovascular testing is suggested      at this point.  She will continue with risk reduction.  3. Followup:  I will see her back in 6 months with her husband, or sooner      if she has any problems.     Rollene Rotunda, MD, [facility]  Electronically Signed    JH/MedQ  DD: 09/26/2006  DT: 09/26/2006  Job #: [id number]   cc:   Bennie Pierini

## 2011-03-31 NOTE — Discharge Summary (Signed)
[place]  Patient:    Jamie Cochran, Jamie Cochran                   MRN: [id number] Adm. Date:  [id number] Disc. Date: 06/26/00 Attending:  Darlin Priestly Dictator:   Halford Decamp. Delanna Ahmadi, R.N., N.P. CC:         [facility]   Discharge Summary  HISTORY OF PRESENT ILLNESS: Ms. Jamie Cochran is a 77 year old white female patient who came into the hospital with onset of central upper chest pain.  This had occurred over several days.  Her troponin was 0.35.  EKG showed no acute changes, inferior MI of unknown age.  She has a history of hypertension, hypothyroidism, and osteoporosis.  It was decided to admit her.  HOSPITAL COURSE: She was put on IV nitroglycerin and beta-blocker was added as well as IV heparin, Plavix, and aspirin, with plan for cardiac catheterization.  Cardiac catheterization was performed on June 25, 2000 and she was noted to have short left main, proximal first diagonal 40% (small artery), a 50% first obtuse marginal, and LV function was 80% with mid cavity obliteration.  She had no significant intracavity gradient.  It was decided she should undergo a 2D echocardiogram to assess the LV cavity and aggressive hypertension therapy was recommended, and medical management of her coronary artery disease.  On June 26, 2000 her hemoglobin was 12, hematocrit 35, WBC 9.2, and platelets 241,000.  Sodium was 138, potassium 3.0.  She was KCl 40 mEq.  Her BUN was 15, creatinine 0.8, glucose 135.  DISCHARGE MEDICATIONS:  1. Lopressor 50 mg b.i.d.  2. Prilosec q.d.  3. Lotrel as taken previously.  4. Actonel q.d.  5. Lopid q.d.  6. Synthroid q.d.  DISCHARGE ACTIVITY: She is to do no strenuous activity, lifting greater than five pounds, driving, or sexual activity for three days.  WOUND CARE: She may wash the groin site with soap and water.  DISCHARGE INSTRUCTIONS: If she as any problems with bruising, bleeding,  or hematoma formation she is to give our office a call.  FOLLOW-UP: She has a follow-up appointment with Dr. Elsie Lincoln on July 11, 2000 at 11 a.m. in [place], [place].  DISCHARGE DIAGNOSES:  1. Chest pain, non-ischemic etiology, status post cardiac catheterization on     June 25, 2000, with no significant high-grade lesions noted.  2. Hypertrophic cardiomyopathy, with mid cavity obliteration.  3. Hypertension.  4. Hypothyroidism.  5. Osteoporosis.  6. Normal ejection fraction. DD:  06/26/00 TD:  06/26/00 Job: [id number]

## 2011-03-31 NOTE — Discharge Summary (Signed)
Jamie Cochran, Jamie Cochran          ACCOUNT NO.:  [id number]   MEDICAL RECORD NO.:  [id number]          PATIENT TYPE:  INP   LOCATION:  5507                         FACILITY:  [facility]   PHYSICIAN:  Alvester Morin, M.D.  DATE OF BIRTH:  18-Nov-1929   DATE OF ADMISSION:  02/07/2009  DATE OF DISCHARGE:  02/11/2009                               DISCHARGE SUMMARY   DISCHARGE DIAGNOSES:  1. Pneumonia.  2. Chest pain secondary to pneumonia.  3. Bacterial conjunctivitis.  4. Otitis media with mastoiditis on the left side.  5. Hypokalemia.  6. Hypomagnesemia.  7. Hearing loss.  8. Diabetes mellitus type 2.  9. Hyperlipidemia.  10.Hypothyroidism.  11.Anemia.   DISCHARGE MEDICATIONS WITH ACCURATE DOSING:  1. Coreg 12.5 mg twice daily.  2. Avelox 400 mg once daily for 14 days.  3. TriCor 48 mg once daily.  4. Amaryl 4 mg once daily.  5. Synthroid 75 mcg once daily.  6. Metformin 1000 mg twice daily.  7. Niacin 1000 mg at night.  8. Floxin ear drops 5 drops twice daily.  9. Zocor 40 mg once daily.  10.Ibuprofen 400 mg 3 times a day as needed for pain.   DISPOSITION AND FOLLOWUP:  Please follow up with Mary-Margaret Daphine Deutscher,  family nurse practitioner, phone number [phone] on March 03, 2009.   PROCEDURES PERFORMED:  1. Chest x-ray, February 07, 2009.  Impression:  Right middle lobe      pneumonia.  2. Left shoulder x-ray, February 09, 2009.  No acute bony pathology.  3. CT head without contrast, February 09, 2009.  No acute intracranial      abnormality, acute and chronic mid sinusitis, right mastoidectomy.      There is a middle ear effusion bilaterally.  There is air-fluid      level on left maxillary sinus.  There is chronic mastoiditis on the      left with middle ear effusion.   CONSULTATIONS:  None.   BRIEF ADMITTING HISTORY AND PHYSICAL:  The patient is a 77 year old  white female with past medical history of hypertension, type 2 diabetes,  osteoporosis, GERD, hypothyroidism,  hyperlipidemia, right ear  cholesteatoma, hearing impairment, and hypertension, who presents with  complain of dry cough, chest pain on the right side for the last 3 days  prior to admission.  These symptoms were preceded by diarrhea that  lasted for 3 days and at present not bothering her.  The patient reports  of dry cough without any sputum.  The patient's chest pain is described  as a sharp, right-sided without any radiation, graded 10/10, resulting  to stopping of the breath and precipitated by coughing.  The patient  does not know any relieving factor for her chest pain.  The patient does  not report any nausea or vomiting or diarrhea at this point of time.  The patient reports good appetite.  The patient has urinary hesitancy  that is chronic.  She has a ear discharge and progressive hearing  deficit over the last month.  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  Lucila Maine was diagnosed  with a strep throat.  The patient reports chills without fevers.  The patient has generalized  weakness and myalgias for the last 3 days.   PHYSICAL EXAMINATION:  VITAL SIGNS:  Temperature of 103, blood pressure  of 156/80, pulse of 79, respiratory rate of 18, O2 saturation 94% on  room air.  GENERAL:  The patient is not in acute distress.  The patient is hard of  hearing.  HEENT:  Eyes, EOMI.  PERRLA.  Left eye discharge present.  Left  conjunctiva injected.  Left periorbital swelling present.  No stye found  on the left eyelid.  ENT, both ears are full with the fluid.  Tympanic  membrane are bulging.  Right side tympanic bulging with the discharge.  Left side ear canal filled with the cerumen.  NECK:  Full range of motion.  No rigidity.  No thyromegaly.  RESPIRATORY:  CTAB except right base crackles.  Decreased breath sounds  on right anterior chest.  Chest wall tender on palpation on right side.  CARDIOVASCULAR:  S1 and S2 normal.  No murmur.  Regular rate and  rhythm.  ABDOMEN:  Soft, nontender.  EXTREMITIES:  No edema.  No injuries.  SKIN:  No skin lesions, dry-appearing skin.  LYMPHATIC:  No lymphadenopathy on lymph exam.  NEUROLOGIC:  Alert and oriented x3.  No focal deficit.  Normal reflexes.  Cranial nerves II-XII intact except hearing problems.  PSYCH:  Alert and oriented x3.   LABORATORY DATA:  Sodium 138, potassium 3.0, chloride 94, bicarb 34, BUN  22, creatinine 1.3, glucose of 197.  Hemoglobin of 13.1, white count of  13.8, platelet of 221, ANC 9.5, MCV 97.6, RDW of 12.6.  CBG 212 and 171.  Urine pH of 6.0, ketone 15, protein 100, nitrite negative, leukocyte  esterase trace.  On micro examination, 3-6 wbc per HPF.  Chest x-ray  shows right middle lobe pneumonia.   HOSPITAL COURSE:  1. Right middle lobe pneumonia.  The patient on admission had signs      and symptoms of pneumonia, which was then confirmed with a chest x-      ray that showed right middle lobe opacity.  The patient was treated      with Rocephin and azithromycin at the time of admission.  The      patient responded clinically to IV antibiotics and felt better      within 2 days.  The patient was then transitioned to oral Avelox      therapy.  The patient had no prior hospital admission or nursing      home stay.  Thus, hospital-acquired pneumonia was not suspected.      The patient remained afebrile during the hospital admission and did      not show any signs of hypoxia.  The patient was discharged in      stable condition.  2. Chest pain.  The patient's chest pain was secondary to pneumonia.      It was pleuritic in nature.  The patient was given pain relief with      morphine at the time of admission.  The patient was then given      ibuprofen as needed, which resulted into significant pain relief.  3. Bacterial conjunctivitis in the left eye.  The patient had clinical      exam supportive of bacterial conjunctivitis.  The patient was given      ciprofloxacin  ointment.  The patient had relief of eye redness and  tearing.  The patient at the time of discharge had cleared her eye      infection.  4. Otitis media with mastoiditis on the left side.  The patient had a      CT scan, which confirmed the clinical suspicion of middle ear      effusion, otitis media, and possible chronic mastoiditis.  The      patient was treated with Avelox for pneumonia, which was then      continued for total of 14 days to treat chronic mastoiditis.  The      patient was advised to follow up with ENT physician in 3 months if      her symptoms did not improve.  5. Hearing loss.  The patient had right-sided hearing loss prior to      admission.  The patient had a hearing aid that she did not bring to      the hospital.  The patient had a progressive hearing loss on the      left side within last 1 month.  The patient had middle ear effusion      as well as chronic mastoiditis, which was treated as described      above.  The patient will follow up with ENT physician for further      hearing aid help.  6. Hypokalemia.  The patient on admission was hypokalemic as well as      hypomagnesemic.  The patient was provided electrolyte      supplementation as needed.  This was likely secondary to her poor      oral intake, vomiting.  At the time of discharge, the patient had      stable electrolytes.  7. Diabetes.  The patient's HbA1c was 7.0.  The patient on admission      was given Amaryl and sliding scale insulin.  The patient at the      time of discharge was instructed to continue her home medications.  8. Hyperlipidemia.  The patient was continued on niacin, TriCor, and      Zocor therapy.  9. Hypothyroidism.  The patient's TSH was rechecked.  It was 0.6.  The      patient was continued on Synthroid 75 mcg at the time of discharge.  10.Anemia.  The patient had hemoglobin of 10.1.  The patient was      investigated with anemia panel.  The patient's anemia panel was  not      conclusive.  The patient had a borderline vitamin B12.  The patient      had normal MMA.  The patient had normal RBC folate.  The patient      will further follow up at the primary care [facility] for anemia      workup.   VITAL SIGNS AT THE TIME OF DISCHARGE:  Temperature 97, pulse 76, blood  pressure 150/73, respiratory rate 20, O2 saturation 96% on room air.   At the time of discharge, the patient's lab work was as following:  WBC  10.4, hemoglobin of 10.1, hematocrit of 29.8, MCV of 98, platelet count  of 181.  Magnesium of 2.0, sodium of 134, potassium 3.5, chloride of  103, bicarb of 25, BUN of 16, and creatinine of 0.8 with glucose of 100,  calcium of 7.6.      Clerance Lav, MD PhD  Electronically Signed      Alvester Morin, M.D.  Electronically Signed    RS/MEDQ  D:  02/16/2009  T:  02/17/2009  Job:  [id number]   cc:   Bennie Pierini, FNP

## 2011-03-31 NOTE — Op Note (Signed)
[place]. [facility]  Patient:    Jamie Cochran Visit Number: [id number] MRN: [id number]          Service Type: DSU Location: [facility] Attending Physician:  Carlean Purl Dictated by:   Kristine Garbe Ezzard Standing, M.D. Proc. Date: 03/14/02 Admit Date:  03/14/2002 Discharge Date: 03/14/2002                             Operative Report  PREOPERATIVE DIAGNOSIS:  Right ear cholesteatoma with significant conductive hearing loss.  POSTOPERATIVE DIAGNOSIS:  Right ear cholesteatoma with significant conductive hearing loss.  PROCEDURE:  Right tympanoplasty with ossicular reconstruction.  SURGEON:  Kristine Garbe. Ezzard Standing, M.D.  ANESTHESIA:  General endotracheal.  COMPLICATIONS:  None.  BRIEF CLINICAL NOTE:  Jamie Cochran is a 77 year old female who has had a previous history of cholesteatoma, status post previous tympanomastoidectomy many years ago.  She has had intermittent drainage from the right ear with significant conductive hearing loss in the right ear.  On examination she has granulation tissue posteriorly inferiorly superiorly with a retraction pocket. She is taken to the operating room at this time for right revision tympanoplasty with ossicular reconstruction.  DESCRIPTION OF PROCEDURE:  After adequate endotracheal anesthesia, the patients ear was prepped with Betadine solution and draped out with sterile towels.  The ear canal was then injected with Xylocaine with epinephrine for hemostasis.  Ear canal was then copiously irrigated and examined.  The patient had a previous myringotomy placed anteriorly in the TM with some granulation tissue.  More posteriorly there was some granulation tissue posteriorly inferiorly and posterior superiorly around the stapes superstructure with retraction of the tympanic membrane.  A posterior-based thin tympanomeatal flap was elevated.  The patient had a previous canal wall down  mastoidectomy. The tympanomeatal flap was elevated off of the residual ear canal bone down to the annulus.  The mastoid area was opened up posteriorly, and some mucus was suctioned from the mastoid area.  There was really no cholesteatoma extending back into the mastoid cavity.  However, there was debris down really inferior to the stapedial tendon along with some granulation tissue down here.  The tympanomeatal flap was elevated off of the stapes superstructure, which was intact and mobile.  There was some scar tissue around the stapes superstructure, but most of the granulation tissue was inferior to the stapes superstructure inferior to the stapedial tendon.  The middle ear space anterior to the malleus long process was intact.  More posteriorly there was minimal middle ear space as the tympanic membrane was retracted down onto the promontory with some granulation tissue posteriorly inferiorly.  Because of some difficult vision posteriorly inferiorly where there was some granulation tissue as well as a retraction pocket, a small sponge soaked in trichloroacetic acid was placed to try to obliterate the remaining granulation tissue and any residual squamous debris that might have been remaining. Hemostasis was obtained with a cotton pledget soaked in adrenalin.  The stapes superstructure was mobile and intact, although some scar tissue was left over the oval window.  A temporalis fascia graft was harvested from a postauricular incision, as was some cartilage was harvested from the tragus area.  These were set aside to dry.  The posterior incision where the temporalis fascia graft was harvested was closed with 2-0 chromic suture subcutaneously and 5-0 nylon on the skin.  The small tragus incision where the cartilage graft was harvested was closed with interrupted  5-0 plain gut suture.  The cartilage was then cut to appropriate size.  A small hole was drilled so as to allow it to be placed  easily on the stapes superstructure.  Then the fascia graft was placed over the cartilage graft on the stapes superstructure and medial to the tympanomeatal flap.  The flap was then brought back down.  The middle ear space was packed with Gelfoam soaked in colimycin, as was the mastoid area. The previous myringotomy tube was switched to a T-tube, and the ear canal was packed with Gelfoam soaked in colimycin.  A mastoid dressing was applied.  The patient was awoken from anesthesia and transferred to the recovery room postop doing well.  Of note, she received 1 g Ancef IV preoperatively.  DISPOSITION:  Jamie Cochran is discharged home later this morning on Keflex 500 mg b.i.d. for one week, Tylenol and Vicodin p.r.n. pain.  We will have her follow up in my office in one week for recheck and have the sutures removed. Dictated by:   Kristine Garbe Ezzard Standing, M.D. Attending Physician:  Carlean Purl DD:  03/14/02 TD:  03/17/02 Job: [id number]

## 2011-03-31 NOTE — Cardiovascular Report (Signed)
[place]. [facility]  Patient:    Jamie Cochran, Jamie Cochran                   MRN: [id number] Proc. Date: 06/25/00 Adm. Date:  [id number] Attending:  Darlin Priestly CC:         Madaline Savage, M.D.  [facility]   Cardiac Catheterization  PROCEDURES 1. Left heart catheterization. 2. Coronary angiography. 3. Left ventriculogram. 4. Abdominal aortogram.  COMPLICATIONS:  None known.  INDICATION:  Ms. Jamie Cochran is a 77 year old white female, a patient of [facility] and Dr. Madaline Savage, who was admitted on June 24, 2000 complaining of chest pain on and off for 24 hours.  The patient was noted to have inferior wall MI with no acute changes.  She had a positive troponin.  She has a history of hypertension, which has been poorly controlled.  She is now referred for cardiac catheterization to define her coronary anatomy.  DESCRIPTION OF OPERATION:  After giving informed written consent, patient was brought to the cardiac catheterization lab where her right and left groins were shaved, prepped and draped in the usual sterile fashion.  ECG monitoring was established.  Using a modified Seldinger technique, a #6-French arterial sheath was inserted in the right femoral artery.  The 6-French diagnostic catheters were then used to perform diagnostic angiography.  This revealed a short left main with no significant disease.  LAD is a large vessel which coursed the apex and ______ two diagonal branches.  The LAD proper had no significant disease.  The first diagonal was a small vessel with a 40% ostial lesion.  The second diagonal was a medium-size vessel with no significant disease.  Left circumflex is a medium-size vessel which gives rise to one medium-size obtuse marginal.  The A-V groove circumflex has no significant disease.  The first OM has a 50% proximal lesion.  The right coronary artery is a large  vessel which is dominant and gives rise to both the PDA as well as the posterolateral branch.  There is no significant disease in the RCA, PDA or posterolateral branch.  Left ventriculogram reveals hyperdynamic LV, estimated EF at 80% with mid-cavitary obliteration.  Following LV angiogram, the pigtail catheter was then pulled into the descending aorta and abdominal aortogram revealed no evidence of significant renal artery stenosis.  The pigtail catheter was then exchanged again for the right coronary artery catheter, which was then positioned in the LV apex without complications.  There was no evidence of significant intracavitary gradient by pullback.  HEMODYNAMICS:  Systemic arterial pressure 190/90, LV systemic pressure 193/16, LVEDP of 20.  CONCLUSIONS 1. No significant coronary artery disease. 2. Hyperdynamic left ventricle with ejection fraction as noted above and    mid-cavitary obliteration with no evidence of intracavitary gradient by    end-hole catheter pullback. 3. No evidence of renal artery stenosis. 4. Systemic hypertension. DD:  06/25/00 TD:  06/26/00 Job: [id number]

## 2011-04-19 ENCOUNTER — Other Ambulatory Visit: Payer: Self-pay | Admitting: Family Medicine

## 2011-04-19 DIAGNOSIS — Z1231 Encounter for screening mammogram for malignant neoplasm of breast: Secondary | ICD-10-CM

## 2011-04-24 ENCOUNTER — Ambulatory Visit
Admission: RE | Admit: 2011-04-24 | Discharge: 2011-04-24 | Disposition: A | Discharge status: Home / Self Care | Payer: Medicare Other | Source: Ambulatory Visit | Attending: Family Medicine | Admitting: Family Medicine

## 2011-04-24 DIAGNOSIS — Z1231 Encounter for screening mammogram for malignant neoplasm of breast: Secondary | ICD-10-CM

## 2011-08-07 LAB — BASIC METABOLIC PANEL WITH GFR
BUN: 27 — ABNORMAL HIGH
CO2: 30
Calcium: 9.5
Chloride: 101
Creatinine, Ser: 0.74
GFR calc non Af Amer: >60
Glucose, Bld: 180 — ABNORMAL HIGH
Potassium: 3.1 — ABNORMAL LOW
Sodium: 141

## 2011-08-07 LAB — HEMOGLOBIN AND HEMATOCRIT, BLOOD
HCT: 39.7
Hemoglobin: 14

## 2011-08-07 LAB — URINALYSIS, ROUTINE W REFLEX MICROSCOPIC
Ketones, ur: NEGATIVE
Protein, ur: NEGATIVE
Urobilinogen, UA: 1

## 2011-08-07 LAB — URINE MICROSCOPIC-ADD ON

## 2011-08-14 LAB — GLUCOSE, CAPILLARY

## 2011-08-14 LAB — POCT I-STAT 4, (NA,K, GLUC, HGB,HCT)
Glucose, Bld: 216 — ABNORMAL HIGH
HCT: 40
Hemoglobin: 13.6

## 2011-08-17 LAB — POCT I-STAT 4, (NA,K, GLUC, HGB,HCT)
Hemoglobin: 13.9 g/dL (ref 12.0–15.0)
Sodium: 138 mEq/L (ref 135–145)

## 2011-08-17 LAB — GLUCOSE, CAPILLARY

## 2012-05-01 LAB — HM DIABETES FOOT EXAM

## 2012-05-03 ENCOUNTER — Emergency Department (HOSPITAL_COMMUNITY)
Admission: EM | Admit: 2012-05-03 | Discharge: 2012-05-03 | Disposition: A | Discharge status: Home / Self Care | Payer: Medicare Other | Attending: Emergency Medicine | Admitting: Emergency Medicine

## 2012-05-03 ENCOUNTER — Encounter (HOSPITAL_COMMUNITY): Payer: Self-pay | Admitting: *Deleted

## 2012-05-03 ENCOUNTER — Emergency Department (HOSPITAL_COMMUNITY): Payer: Medicare Other

## 2012-05-03 DIAGNOSIS — M545 Low back pain, unspecified: Secondary | ICD-10-CM | POA: Insufficient documentation

## 2012-05-03 DIAGNOSIS — S20229A Contusion of unspecified back wall of thorax, initial encounter: Secondary | ICD-10-CM | POA: Insufficient documentation

## 2012-05-03 DIAGNOSIS — M25559 Pain in unspecified hip: Secondary | ICD-10-CM | POA: Insufficient documentation

## 2012-05-03 DIAGNOSIS — Z79899 Other long term (current) drug therapy: Secondary | ICD-10-CM | POA: Insufficient documentation

## 2012-05-03 DIAGNOSIS — W19XXXA Unspecified fall, initial encounter: Secondary | ICD-10-CM

## 2012-05-03 DIAGNOSIS — E119 Type 2 diabetes mellitus without complications: Secondary | ICD-10-CM | POA: Insufficient documentation

## 2012-05-03 DIAGNOSIS — I10 Essential (primary) hypertension: Secondary | ICD-10-CM | POA: Insufficient documentation

## 2012-05-03 DIAGNOSIS — W010XXA Fall on same level from slipping, tripping and stumbling without subsequent striking against object, initial encounter: Secondary | ICD-10-CM | POA: Insufficient documentation

## 2012-05-03 DIAGNOSIS — Y92009 Unspecified place in unspecified non-institutional (private) residence as the place of occurrence of the external cause: Secondary | ICD-10-CM | POA: Insufficient documentation

## 2012-05-03 DIAGNOSIS — I2510 Atherosclerotic heart disease of native coronary artery without angina pectoris: Secondary | ICD-10-CM | POA: Insufficient documentation

## 2012-05-03 DIAGNOSIS — E7800 Pure hypercholesterolemia, unspecified: Secondary | ICD-10-CM | POA: Insufficient documentation

## 2012-05-03 HISTORY — DX: Essential (primary) hypertension: I10

## 2012-05-03 MED ORDER — METHOCARBAMOL 500 MG PO TABS: 500.0000 mg | ORAL_TABLET | Freq: Two times a day (BID) | ORAL | Status: AC

## 2012-05-03 MED ORDER — HYDROCODONE-ACETAMINOPHEN 5-325 MG PO TABS
2.0000 | ORAL_TABLET | Freq: Once | ORAL | Status: AC
  Administered 2012-05-03: 2 via ORAL
  Filled 2012-05-03: qty 2

## 2012-05-03 MED ORDER — HYDROCODONE-ACETAMINOPHEN 5-325 MG PO TABS: 2.0000 | ORAL_TABLET | Freq: Four times a day (QID) | ORAL | Status: AC | PRN

## 2012-05-03 NOTE — ED Provider Notes (Signed)
History    This chart was scribed for Ward Givens, MD, MD by Smitty Pluck. The patient was seen in room APA14 and the patient's care was started at 3:08PM.   CSN: [id number]  Arrival date & time 05/03/12  1431   First MD Initiated Contact with Patient 05/03/12 1450      Chief Complaint  Patient presents with  . Fall    (Consider location/radiation/quality/duration/timing/severity/associated sxs/prior treatment) Patient is a 78 y.o. female presenting with fall. The history is provided by the patient.  Fall   Jamie Cochran is a 78 y.o. female who presents to the Emergency Department due to fall 1 day ago at night in her home. Pt reports that she fell in the door while going into the house. She reports that she loss her balance with her having her hands full (holding drinks and food). Pt fell forward. Pt was able to get up and try to clean up food. Pt reports that she fell again onto her left side when she tried to pick up food on floor. Denies LOC and head injury. She reports that moderate back and left  hip pain started after second fall. She thinks she may have hit the stool when she fell the second time. Family report she has been limping today.Pt has hx od hyperlipidemia, HTN and diabetes.   Bennie Pierini, PA  at [facility]  Past Medical History  Diagnosis Date  . Diabetes mellitus   . Coronary artery disease   . Hypertension   high cholesterol  Past Surgical History  Procedure Date  . Abdominal hysterectomy   . Cesarean section   . Bladder repair   . Inner ear surgery   . Tonsillectomy and adenoidectomy     History reviewed. No pertinent family history.  History  Substance Use Topics  . Smoking status: Never Smoker   . Smokeless tobacco: Not on file  . Alcohol Use: No  lives at home Lives with husband  OB History    Grav Para Term Preterm Abortions TAB SAB Ect Mult Living                  Review of Systems  All other systems  reviewed and are negative.  10 Systems reviewed and all are negative for acute change except as noted in the HPI.    Allergies  Sulfa antibiotics  Home Medications   Current Outpatient Rx  Name Route Sig Dispense Refill  . ALENDRONATE SODIUM 70 MG PO TABS Oral Take 70 mg by mouth every 7 (seven) days. Take with a full glass of water on an empty stomach.     . AMLODIPINE BESYLATE 5 MG PO TABS Oral Take 5 mg by mouth daily.      . ATORVASTATIN CALCIUM 20 MG PO TABS Oral Take 20 mg by mouth daily.      Marland Kitchen CARVEDILOL 12.5 MG PO TABS Oral Take 12.5 mg by mouth 2 (two) times daily with a meal.      . CLONIDINE HCL 0.1 MG PO TABS Oral Take 0.1 mg by mouth 2 (two) times daily.      . FENOFIBRATE 48 MG PO TABS Oral Take 48 mg by mouth daily.      . FUROSEMIDE 20 MG PO TABS Oral Take 20 mg by mouth 2 (two) times daily.      Marland Kitchen GLIMEPIRIDE 4 MG PO TABS Oral Take 4 mg by mouth daily before breakfast.      .  LEVOTHYROXINE SODIUM 75 MCG PO TABS Oral Take 75 mcg by mouth daily.      Marland Kitchen LISINOPRIL 20 MG PO TABS Oral Take 20 mg by mouth daily.      Marland Kitchen POTASSIUM CHLORIDE 20 MEQ PO PACK Oral Take 20 mEq by mouth 2 (two) times daily. 2 tablets in AM and 1 QHS     . SAXAGLIPTIN-METFORMIN ER 2.03-999 MG PO TB24 Oral Take 1 tablet by mouth 2 (two) times daily.        BP 239/105  Pulse 95  Temp 98 F (36.7 C) (Oral)  Resp 18  Ht 4\' 8"  (1.422 m)  Wt 124 lb (56.246 kg)  BMI 27.80 kg/m2  SpO2 97%  Vital signs normal except hypertension   Physical Exam  Nursing note and vitals reviewed. Constitutional: She is oriented to person, place, and time. She appears well-developed and well-nourished. No distress.  HENT:  Head: Normocephalic and atraumatic.  Right Ear: External ear normal.  Left Ear: External ear normal.  Nose: Nose normal.  Mouth/Throat: Oropharynx is clear and moist.  Eyes: Conjunctivae and EOM are normal. Pupils are equal, round, and reactive to light.  Neck: Normal range of motion. Neck  supple.       Nontender cervical spine  Cardiovascular: Normal rate, regular rhythm and normal heart sounds.   Pulmonary/Chest: Effort normal and breath sounds normal. No respiratory distress. She has no wheezes. She has no rales. She exhibits no tenderness.  Abdominal: Soft. She exhibits no distension. There is no tenderness. There is no rebound and no guarding.  Musculoskeletal: Normal range of motion. She exhibits no edema and no tenderness.       Arms:      lower lumbar spine tenderness  Palm sized bruising to left midline lower lumbar/ sacral area No shortening of left lower extremities, moves left hip well.  Neurological: She is alert and oriented to person, place, and time.  Skin: Skin is warm and dry.  Psychiatric: She has a normal mood and affect. Her behavior is normal.    ED Course  Procedures (including critical care time)   Medications  HYDROcodone-acetaminophen (NORCO) 5-325 MG per tablet 2 tablet (2 tablet Oral Given 05/03/12 1527)    DIAGNOSTIC STUDIES: Oxygen Saturation is 97% on room air, normal by my interpretation.    COORDINATION OF CARE: 3:19PM EDP discusses pt ED treatment with pt.  3:30PM EDP orders medication: norco 325 mg   Labs Reviewed - No data to display Dg Lumbar Spine Complete  05/03/2012  *RADIOLOGY REPORT*  Clinical Data: .  Back and hip pain.  LUMBAR SPINE - COMPLETE 4+ VIEW  Comparison: CT abdomen and pelvis 06/15/2006.  Findings: There is convex left scoliosis.  Multilevel degenerative disease is identified.  The patient has a T12 compression fracture which is remote.  No other fracture is identified.  IMPRESSION: No acute finding.  Scoliosis, degenerative disease and remote T12 compression fracture noted.  Original Report Authenticated By: Bernadene Bell. D'ALESSIO, M.D.   Dg Hip Complete Left  05/03/2012  *RADIOLOGY REPORT*  Clinical Data: Status post fall times two.  Pain.  LEFT HIP - COMPLETE 2+ VIEW  Comparison: None.  Findings: No acute bony or  joint abnormality is identified.  No notable degenerative change.  Surgical clips in the right groin are noted.  IMPRESSION: No acute finding.  Original Report Authenticated By: Bernadene Bell. Maricela Curet, M.D.   Ct Lumbar Spine Wo Contrast  05/03/2012  *RADIOLOGY REPORT*  Clinical Data: Fall.  Back pain  CT LUMBAR SPINE WITHOUT CONTRAST  Technique:  Multidetector CT imaging of the lumbar spine was performed without intravenous contrast administration. Multiplanar CT image reconstructions were also generated.  Comparison: Lumbar radiographs today.  CT abdomen 06/15/2006  Findings: Remote fracture T12 is unchanged from 2007.  There is endplate irregularity and spurring associated with this fracture. No acute fracture.  No mass lesion is identified.  Lumbar scoliosis present.  T12-L1:  Mild disc degeneration.  Nonobstructing 3 mm left  kidney stone.  4 mm retrolisthesis L1-2 without significant stenosis.  L2-3:  Mild disc degeneration with disc bulging and mild spurring.  L3-4:  Disc bulging and calcified annular fibers.  Mild facet degeneration and mild spinal stenosis.  L4-5:  Disc bulging and mild facet degeneration.  L5-S1:  Mild disc degeneration and disc bulging.  Moderate facet hypertrophy bilaterally without significant spinal stenosis.  IMPRESSION: Chronic fracture of L1.  No acute fracture.  Scoliosis and lumbar degenerative changes.  Original Report Authenticated By: Camelia Phenes, M.D.     1. Fall   2. Contusion, back     New Prescriptions   HYDROCODONE-ACETAMINOPHEN (NORCO) 5-325 MG PER TABLET    Take 2 tablets by mouth every 6 (six) hours as needed for pain.   METHOCARBAMOL (ROBAXIN) 500 MG TABLET    Take 1 tablet (500 mg total) by mouth 2 (two) times daily.    Plan discharge  Devoria Albe, MD, FACEP   MDM    I personally performed the services described in this documentation, which was scribed in my presence. The recorded information has been reviewed and considered.  Devoria Albe, MD,  Armando Gang   Ward Givens, MD 05/03/12 [phone]

## 2012-05-03 NOTE — ED Notes (Signed)
Pt reports that she lost balance and fell, injured low back -h/o of falls and previous back injury.  Denies loc.

## 2012-05-03 NOTE — ED Notes (Signed)
Fall last pm tripped when going into her home.  Pain lt hip area,  NO HI, no LOC.  Ambulatory into triage.

## 2012-05-03 NOTE — Discharge Instructions (Signed)
Ice packs to the injured area (bruised area on your back). Take the medications as prescribed. You can be rechecked next week by PA Daphine Deutscher if you continue to have a lot of pain over the weekend.

## 2012-05-03 NOTE — ED Notes (Signed)
Dr.knapp to see pt.  

## 2012-05-28 ENCOUNTER — Emergency Department (HOSPITAL_COMMUNITY): Payer: Medicare Other

## 2012-05-28 ENCOUNTER — Inpatient Hospital Stay (HOSPITAL_COMMUNITY)
Admission: EM | Admit: 2012-05-28 | Discharge: 2012-06-03 | DRG: 884 | Disposition: A | Discharge status: Skilled Nursing Facility | Payer: Medicare Other | Attending: Internal Medicine | Admitting: Internal Medicine

## 2012-05-28 ENCOUNTER — Encounter (HOSPITAL_COMMUNITY): Payer: Self-pay | Admitting: Emergency Medicine

## 2012-05-28 DIAGNOSIS — Z79899 Other long term (current) drug therapy: Secondary | ICD-10-CM

## 2012-05-28 DIAGNOSIS — I69998 Other sequelae following unspecified cerebrovascular disease: Secondary | ICD-10-CM

## 2012-05-28 DIAGNOSIS — F0390 Unspecified dementia without behavioral disturbance: Principal | ICD-10-CM | POA: Diagnosis present

## 2012-05-28 DIAGNOSIS — Z7982 Long term (current) use of aspirin: Secondary | ICD-10-CM

## 2012-05-28 DIAGNOSIS — IMO0001 Reserved for inherently not codable concepts without codable children: Secondary | ICD-10-CM | POA: Diagnosis present

## 2012-05-28 DIAGNOSIS — I69928 Other speech and language deficits following unspecified cerebrovascular disease: Secondary | ICD-10-CM

## 2012-05-28 DIAGNOSIS — R739 Hyperglycemia, unspecified: Secondary | ICD-10-CM

## 2012-05-28 DIAGNOSIS — M810 Age-related osteoporosis without current pathological fracture: Secondary | ICD-10-CM | POA: Diagnosis present

## 2012-05-28 DIAGNOSIS — R29898 Other symptoms and signs involving the musculoskeletal system: Secondary | ICD-10-CM | POA: Diagnosis present

## 2012-05-28 DIAGNOSIS — R4182 Altered mental status, unspecified: Secondary | ICD-10-CM

## 2012-05-28 DIAGNOSIS — E869 Volume depletion, unspecified: Secondary | ICD-10-CM | POA: Diagnosis present

## 2012-05-28 DIAGNOSIS — E039 Hypothyroidism, unspecified: Secondary | ICD-10-CM | POA: Diagnosis present

## 2012-05-28 DIAGNOSIS — I10 Essential (primary) hypertension: Secondary | ICD-10-CM | POA: Diagnosis present

## 2012-05-28 DIAGNOSIS — E785 Hyperlipidemia, unspecified: Secondary | ICD-10-CM | POA: Diagnosis present

## 2012-05-28 DIAGNOSIS — I6529 Occlusion and stenosis of unspecified carotid artery: Secondary | ICD-10-CM | POA: Diagnosis present

## 2012-05-28 DIAGNOSIS — I2510 Atherosclerotic heart disease of native coronary artery without angina pectoris: Secondary | ICD-10-CM | POA: Diagnosis present

## 2012-05-28 DIAGNOSIS — E876 Hypokalemia: Secondary | ICD-10-CM | POA: Diagnosis not present

## 2012-05-28 DIAGNOSIS — I658 Occlusion and stenosis of other precerebral arteries: Secondary | ICD-10-CM | POA: Diagnosis present

## 2012-05-28 DIAGNOSIS — R531 Weakness: Secondary | ICD-10-CM | POA: Diagnosis present

## 2012-05-28 HISTORY — DX: Age-related osteoporosis without current pathological fracture: M81.0

## 2012-05-28 HISTORY — DX: Unspecified hearing loss, unspecified ear: H91.90

## 2012-05-28 HISTORY — DX: Hypothyroidism, unspecified: E03.9

## 2012-05-28 HISTORY — DX: Hyperlipidemia, unspecified: E78.5

## 2012-05-28 HISTORY — DX: Reserved for concepts with insufficient information to code with codable children: IMO0002

## 2012-05-28 HISTORY — DX: Unspecified urinary incontinence: R32

## 2012-05-28 HISTORY — DX: Type 2 diabetes mellitus with hyperglycemia: E11.65

## 2012-05-28 HISTORY — DX: Atherosclerotic heart disease of native coronary artery without angina pectoris: I25.10

## 2012-05-28 HISTORY — DX: Cerebral infarction, unspecified: I63.9

## 2012-05-28 LAB — CBC WITH DIFFERENTIAL/PLATELET
Basophils Absolute: 0 10*3/uL (ref 0.0–0.1)
Basophils Relative: 0 % (ref 0–1)
HCT: 37.5 % (ref 36.0–46.0)
Hemoglobin: 12.9 g/dL (ref 12.0–15.0)
Lymphocytes Relative: 33 % (ref 12–46)
MCHC: 34.4 g/dL (ref 30.0–36.0)
Monocytes Absolute: 0.7 10*3/uL (ref 0.1–1.0)
Neutro Abs: 4.2 10*3/uL (ref 1.7–7.7)
Neutrophils Relative %: 57 % (ref 43–77)
RDW: 13.3 % (ref 11.5–15.5)
WBC: 7.4 10*3/uL (ref 4.0–10.5)

## 2012-05-28 LAB — BASIC METABOLIC PANEL
CO2: 27 mEq/L (ref 19–32)
Chloride: 99 mEq/L (ref 96–112)
Creatinine, Ser: 0.54 mg/dL (ref 0.50–1.10)
GFR calc Af Amer: >90 mL/min (ref >90)
Potassium: 3.5 mEq/L (ref 3.5–5.1)

## 2012-05-28 LAB — URINALYSIS, ROUTINE W REFLEX MICROSCOPIC
Glucose, UA: 500 mg/dL — AB
Leukocytes, UA: NEGATIVE
Nitrite: NEGATIVE
Specific Gravity, Urine: 1.019 (ref 1.005–1.030)
pH: 7.5 (ref 5.0–8.0)

## 2012-05-28 LAB — CARDIAC PANEL(CRET KIN+CKTOT+MB+TROPI)
Relative Index: INVALID (ref 0.0–2.5)
Total CK: 18 U/L (ref 7–177)

## 2012-05-28 LAB — ETHANOL: Alcohol, Ethyl (B): <11 mg/dL (ref 0–11)

## 2012-05-28 LAB — GLUCOSE, CAPILLARY

## 2012-05-28 MED ORDER — LEVOTHYROXINE SODIUM 75 MCG PO TABS
75.0000 ug | ORAL_TABLET | Freq: Every day | ORAL | Status: DC
  Administered 2012-05-30 – 2012-06-03 (×5): 75 ug via ORAL
  Filled 2012-05-28 (×7): qty 1

## 2012-05-28 MED ORDER — CLONIDINE HCL 0.1 MG PO TABS
0.1000 mg | ORAL_TABLET | Freq: Two times a day (BID) | ORAL | Status: DC
  Filled 2012-05-28 (×3): qty 1

## 2012-05-28 MED ORDER — INSULIN ASPART 100 UNIT/ML ~~LOC~~ SOLN
0.0000 [IU] | Freq: Three times a day (TID) | SUBCUTANEOUS | Status: DC
  Administered 2012-05-29 (×3): 2 [IU] via SUBCUTANEOUS
  Administered 2012-05-30: 3 [IU] via SUBCUTANEOUS
  Administered 2012-05-30: 9 [IU] via SUBCUTANEOUS

## 2012-05-28 MED ORDER — ENOXAPARIN SODIUM 40 MG/0.4ML ~~LOC~~ SOLN
40.0000 mg | SUBCUTANEOUS | Status: DC
  Administered 2012-05-28 – 2012-06-01 (×5): 40 mg via SUBCUTANEOUS
  Filled 2012-05-28 (×8): qty 0.4

## 2012-05-28 MED ORDER — PNEUMOCOCCAL VAC POLYVALENT 25 MCG/0.5ML IJ INJ
0.5000 mL | INJECTION | INTRAMUSCULAR | Status: AC
  Administered 2012-05-29: 0.5 mL via INTRAMUSCULAR
  Filled 2012-05-28: qty 0.5

## 2012-05-28 MED ORDER — SENNOSIDES-DOCUSATE SODIUM 8.6-50 MG PO TABS: 1.0000 | ORAL_TABLET | Freq: Every evening | ORAL | Status: DC | PRN

## 2012-05-28 MED ORDER — ATORVASTATIN CALCIUM 20 MG PO TABS
20.0000 mg | ORAL_TABLET | Freq: Every day | ORAL | Status: DC
  Administered 2012-05-29 – 2012-06-03 (×5): 20 mg via ORAL
  Filled 2012-05-28 (×7): qty 1

## 2012-05-28 MED ORDER — SODIUM CHLORIDE 0.9 % IV SOLN
INTRAVENOUS | Status: AC
  Administered 2012-05-28 – 2012-05-29 (×2): via INTRAVENOUS

## 2012-05-28 NOTE — ED Provider Notes (Signed)
History     CSN: [id number]  Arrival date & time 05/28/12  1225   First MD Initiated Contact with Patient 05/28/12 1252      No chief complaint on file.   (Consider location/radiation/quality/duration/timing/severity/associated sxs/prior treatment) HPI  Patient is brought to the emergency department by EMS from home with concern of altered mental status per her family who are not currently at bedside. EMS reports that family states that patient woke this morning and seemed altered, had slurred speech, and a questionable left side hand grip weakness and questionable facial droop. Per the family patient was just recently discharged from a hospital in [facility] for stroke. A level V caveat applies due to patient's altered mental status. Will update history of present illness when and if family presents to ER. Attempting to get discharge summary from recent hospitalization in [facility]. Per EMS lasting normal is greater than 12 hours prior to arrival. Symptoms were acute onset upon waking this morning, persistent.  3:09 PM Patient's daughter is now bedside and she contributes additional information. She states that the family was vacationing in [place] over the last few weeks and the patient had acute onset altered mental status with the family stating she was "talking out of her head." She was taken to the hospital at [facility] and admitted to the hospital for a urinary tract infection and hyperglycemia. The daughter reports that they required IV insulin due to her elevated blood sugars. Patient was sent back home to their vacation home on antibiotics after a few day stay. Daughter states they felt like she was doing better for a few days however then she had acute onset altered mental status once again as well as some involuntary movements and she was taken to the hospital once again. She states that during that hospital stay were informed patient had a stroke. After staying in the  hospital for a few more days they were told that she had symptomatically improved she was ready for discharge home. The daughter states she inquired into rehabilitation status post stroke but states she was told that she improved to the point that she did not require rehabilitation and she could be discharged  home. She arrived home to daughter's home yesterday and the daughter reports that she was thinking more clearly and appeared to be well. However when she was awoken this morning to the taken to her doctor in [facility] for a followup visit the daughter reports that she once again seemed altered. They state they took her to her primary care physician, Dr. Gennette Pac who on her evaluation was concerned for slurred speech and right hand grip weakness. She advised them at that time to have her sent to emergency department by EMS for further evaluation of stroke and hospitalization for placement into rehabilitation.  Past Medical History  Diagnosis Date  . Diabetes mellitus   . Coronary artery disease   . Hypertension   . Stroke     Past Surgical History  Procedure Date  . Abdominal hysterectomy   . Cesarean section   . Bladder repair   . Inner ear surgery   . Tonsillectomy and adenoidectomy     History reviewed. No pertinent family history.  History  Substance Use Topics  . Smoking status: Never Smoker   . Smokeless tobacco: Not on file  . Alcohol Use: No    OB History    Grav Para Term Preterm Abortions TAB SAB Ect Mult Living  Review of Systems  Unable to perform ROS   Allergies  Sulfa antibiotics  Home Medications   Current Outpatient Rx  Name Route Sig Dispense Refill  . AMLODIPINE BESYLATE 5 MG PO TABS Oral Take 5 mg by mouth daily.      . ATORVASTATIN CALCIUM 20 MG PO TABS Oral Take 20 mg by mouth daily.      Marland Kitchen CARVEDILOL 12.5 MG PO TABS Oral Take 12.5 mg by mouth 2 (two) times daily with a meal.      . CLONIDINE HCL 0.1 MG PO TABS  Oral Take 0.1 mg by mouth 2 (two) times daily.      . FENOFIBRATE 48 MG PO TABS Oral Take 48 mg by mouth daily.      . FUROSEMIDE 20 MG PO TABS Oral Take 20 mg by mouth 2 (two) times daily.      Marland Kitchen GLIMEPIRIDE 4 MG PO TABS Oral Take 4 mg by mouth daily before breakfast.      . LEVOTHYROXINE SODIUM 75 MCG PO TABS Oral Take 75 mcg by mouth daily.      Marland Kitchen LISINOPRIL 20 MG PO TABS Oral Take 20 mg by mouth daily.      Marland Kitchen POTASSIUM CHLORIDE 20 MEQ PO PACK Oral Take 40 mEq by mouth 2 (two) times daily.     Marland Kitchen SAXAGLIPTIN-METFORMIN ER 2.03-999 MG PO TB24 Oral Take 1 tablet by mouth 2 (two) times daily.        BP 182/69  Pulse 76  Temp 97.7 F (36.5 C) (Oral)  Resp 21  SpO2 97%  Physical Exam  Nursing note and vitals reviewed. Constitutional: She appears well-developed and well-nourished. No distress.  HENT:  Head: Normocephalic and atraumatic.       Dry mucus membranes  Eyes: Conjunctivae and EOM are normal. Pupils are equal, round, and reactive to light.  Neck: Normal range of motion. Neck supple.  Cardiovascular: Normal rate, regular rhythm, normal heart sounds and intact distal pulses.  Exam reveals no gallop and no friction rub.   No murmur heard. Pulmonary/Chest: Effort normal and breath sounds normal. No respiratory distress. She has no wheezes. She has no rales. She exhibits no tenderness.  Abdominal: Soft. Bowel sounds are normal. She exhibits no distension and no mass. There is no tenderness. There is no rebound and no guarding.  Musculoskeletal: Normal range of motion. She exhibits no edema and no tenderness.  Neurological: She is alert. No cranial nerve deficit.       Alert to self but not oriented to place or time. States she is in [place] but then states "maybe I am in [place] or [place]   4/5 strength of bilateral UE and LE.   Skin: Skin is warm and dry. No rash noted. She is not diaphoretic. No erythema.  Psychiatric: She has a normal mood and affect.     ED Course  Procedures (including critical care time)  IV lock.    Date: 05/28/2012  Rate: 74  Rhythm: normal sinus rhythm  QRS Axis: normal  Intervals: normal  ST/T Wave abnormalities: normal  Conduction Disutrbances:none  Narrative Interpretation:   Old EKG Reviewed: non provocative compared to Feb 10, 2009   Labs Reviewed  BASIC METABOLIC PANEL - Abnormal; Notable for the following:    Glucose, Bld 234 (*)     GFR calc non Af Amer 87 (*)     All other components within normal limits  CBC WITH DIFFERENTIAL  ETHANOL  URINALYSIS, ROUTINE W REFLEX MICROSCOPIC   Dg Chest 2 View  05/28/2012  *RADIOLOGY REPORT*  Clinical Data: Altered mental status.  CHEST - 2 VIEW  Comparison: 04/05/2009  Findings: Mild cardiomegaly.  Low lung volumes without focal opacity, effusion or edema.  No acute bony abnormality.  Mild old compression fracture in the upper lumbar spine or lower thoracic spine, stable.  Mild degenerative changes in the thoracolumbar spine.  IMPRESSION: Cardiomegaly.  Low lung volumes.  No acute findings.  Original Report Authenticated By: Cyndie Chime, M.D.   Ct Head Wo Contrast  05/28/2012  *RADIOLOGY REPORT*  Clinical Data: Altered mental status.  Slurred speech.  Recent discharge for stroke from outside facility.  CT HEAD WITHOUT CONTRAST  Technique:  Contiguous axial images were obtained from the base of the skull through the vertex without contrast.  Comparison: 02/09/2009.  Findings: Slight asymmetric hypodensity right insular cortex and and adjacent white matter are could represent acute, subacute, or chronic ischemia.  No CT signs of proximal vascular thrombosis. No areas of hemorrhage or mass lesion. Mild atrophy with chronic microvascular ischemic change is suspected.  Calvarium is intact. Clear sinuses.  Chronic mastoid changes on the right.  Slight progression atrophy and white matter change compared with 2010.  IMPRESSION: Slight asymmetric hypodensity right  insular cortex and adjacent white matter could be acute, subacute or chronic.  No acute hemorrhage or well-demarcated large vessel cortical infarct is observed.  Original Report Authenticated By: Elsie Stain, M.D.     1. Altered mental status   2. Hyperglycemia       MDM  Patient to be evaluted by [facility] for admission for AMS, hyperglycemia. VSS.         [place], [place] 05/28/12 [phone]

## 2012-05-28 NOTE — ED Provider Notes (Signed)
Medical screening examination/treatment/procedure(s) were conducted as a shared visit with non-physician practitioner(s) and myself.  I personally evaluated the patient during the encounter Pt has hx of stroke and recurrent utis.  Brought here for evaluation of confusion and weakness.  pe confused. No distress. Heart and lungs nl. abd soft. No ttp.  Will check ct, cxr, labs for eval and tx according to findings.  Cheri Guppy, MD 05/28/12 [phone]

## 2012-05-28 NOTE — ED Notes (Addendum)
Per EMS: pt from home c/o being discharged yesterday from hospital in [facility] for stroke; pt awoke this am with slurred speech, off balance gait and left sided facial drooping and some altered LOC; pt LSN 12 hours ago; pt alert at present and mae; pt denies pain but grimaces with movement from stretcher to bed; unsure if maybe was stiff from transfer

## 2012-05-28 NOTE — H&P (Signed)
Date: 05/28/2012               Patient Name:  Jamie Cochran MRN: [id number]  DOB: Nov 27, 1929 Age / Sex: 78 y.o., female   PCP:               Medical Service: Internal Medicine Teaching Service              Attending Physician: Dr. Blanch Media    First Contact: Dr. Lavena Bullion Pager: [phone]  Second Contact: Dr. Saralyn Pilar Pager: [phone]            After Hours (After 5p/  First Contact Pager: [phone]  weekends / holidays): Second Contact Pager: [phone]     Chief Complaint: Altered mental status  History of Present Illness: Patient is a 78 y.o. female with a PMHx of DMII (A1c of 10.6), nonobstructive CAD, hypothyroidism,HTN, and recent stroke 1 week ago (diagnosed in [place], [place]) who is being admitted to [facility] on 05/28/2012 for evaluation of altered mental status. Patient was brought in by her daughter who states that she was "talking out of her head" and not making logical sense. The majority of the history was provided by the daughter, who states that the patient has had a fluctuating level of consciousness since being admitted to a hospital in [place], [place] for a UTI, hyperglycemia, and AMS. The patient was sent home on antibiotics for UTI. Due to no improvement in her AMS, she returned to the hospital on 05/23/2012, and was diagnosed with an acute ischemic infarct in the right frontal lobe by MRI. The patient was discharged following what was apparently a 3 day hospital course. In the four days since this discharge, the daughter states that sometimes the mother is lucid and oriented, and at other times she is disoriented and does not make sense when she talks.   The daughter denies noticing any neurologic symptoms in her mother, other than unsteady balance and overall weakness/fatigue, which has been present for approximately 10 days. She states that also prior to the last 10 days her mother has never talked nonsensically, or had the symptoms of  fluctuating consciousness that she has exhibited since that time. She states that she believes her mother has mild dementia, and cites examples including losing her keys around the house, and misplacing other items. She states that her mother has been able to fully care for herself prior to 10 days ago, and that she has shown no deterioration in any of her activities of daily living.  The patient herself is difficult to obtain a history from, secondary to drowsiness and AMS. She is, however, able to state that she has been experiencing dysuria. She denies any other complains and states that she wants to go home.   Review of Systems: Negative other than in HPI. To note, the patient is a poor historian and a full ROS was difficult to obtain 2/2 to AMS.  Current Outpatient Medications: No current facility-administered medications for this encounter.   Current Outpatient Prescriptions  Medication Sig Dispense Refill  . amLODipine (NORVASC) 5 MG tablet Take 5 mg by mouth daily.        Marland Kitchen amoxicillin-clavulanate (AUGMENTIN) 875-125 MG per tablet Take 1 tablet by mouth 2 (two) times daily.      Marland Kitchen aspirin 81 MG tablet Take 81 mg by mouth daily.      Marland Kitchen atorvastatin (LIPITOR) 40 MG tablet Take 40 mg by mouth daily.      Marland Kitchen  carvedilol (COREG) 25 MG tablet Take 25 mg by mouth 2 (two) times daily with a meal.      . celecoxib (CELEBREX) 200 MG capsule Take 200 mg by mouth daily.      . cholecalciferol (VITAMIN D) 400 UNITS TABS Take 400 Units by mouth daily.      . cloNIDine (CATAPRES) 0.1 MG tablet Take 0.1 mg by mouth 2 (two) times daily.        . fenofibrate (TRICOR) 48 MG tablet Take 48 mg by mouth daily.        . furosemide (LASIX) 20 MG tablet Take 20 mg by mouth 2 (two) times daily.        . insulin glargine (LANTUS) 100 UNIT/ML injection Inject 30 Units into the skin at bedtime.      Marland Kitchen levothyroxine (SYNTHROID, LEVOTHROID) 75 MCG tablet Take 75 mcg by mouth daily.        Marland Kitchen lisinopril  (PRINIVIL,ZESTRIL) 40 MG tablet Take 40 mg by mouth daily.      Marland Kitchen omeprazole (PRILOSEC) 40 MG capsule Take 40 mg by mouth daily.      . Saxagliptin-Metformin (KOMBIGLYZE XR) 2.03-999 MG TB24 Take 1 tablet by mouth 2 (two) times daily.        Marland Kitchen glimepiride (AMARYL) 4 MG tablet Take 4 mg by mouth daily before breakfast.          Allergies: Allergies  Allergen Reactions  . Sulfa Antibiotics Rash     Past Medical History: Past Medical History  Diagnosis Date  . Diabetes type 2, uncontrolled   . Non-occlusive coronary artery disease     40% first diagonal stenosis, 50% obtuse marginal stenosis  . Hypertension   . Stroke in 05/2012    deep right frontal perventricular deep white matter infarct + subacute left thalamic lacunar infarct  . Hyperlipidemia   . Hypothyroidism   . Osteoporosis   . Hearing loss     history of right sided cholesteotoma s/p Right tympanoplasty with ossicular reconstruction. (03/2002)  . Urinary incontinence     s/p surgeyr for ureterocele    Past Surgical History: Past Surgical History  Procedure Date  . Abdominal hysterectomy   . Cesarean section   . Bladder repair   . Inner ear surgery   . Tonsillectomy and adenoidectomy     Family History: History reviewed. No pertinent family history.  Social History: History   Social History  . Marital Status: Married    Spouse Name: N/A    Number of Children: N/A  . Years of Education: N/A   Occupational History  . retired    Social History Main Topics  . Smoking status: Never Smoker   . Smokeless tobacco: Not on file  . Alcohol Use: No  . Drug Use: No  . Sexually Active:    Other Topics Concern  . Not on file   Social History Narrative   Lives at home alone in [place], [place].   // Independence with ADLS:Bathing: YesDressing: YesToileting: YesMaintaining continence: NoGrooming: YesFeeding: YesTransferring: Yes// Independence with IADLS: Shopping for groceries: Yes Driving or using public  transportation: N/AUsing the telephone: YesPerforming housework: YesDoing home repair: Oceanographer:  Betti Cruz medications: YesHandling finances: Yes   Vital Signs: Blood pressure 183/69, pulse 68, temperature 99.6 F (37.6 C), temperature source Oral, resp. rate 16, SpO2 95.00%.  Physical Exam: General: Vital signs reviewed and noted. Well-developed, well-nourished, in no acute distress. Alert at times, decreased level of consciousness at others  Head: Normocephalic,  atraumatic.  Eyes: PERRL. EOM not able to be assessed  Nose: Mucous membranes dry, non-inflammed.  Throat: Oropharynx nonerythematous, no exudate appreciated.   Lungs:  Normal respiratory effort. Clear to auscultation BL without crackles or wheezes. To note, exam was limited 2/2 patient not able to inspire deeply on request  Heart: RRR. S1 and S2 normal. Systolic murmur appreciated, most notable in R 2nd intercostal space  Abdomen:  BS normoactive. Soft, Nondistended, non-tender.  Extremities: 1+ pretibial edema.  Neurologic: Oriented x 1 (person). Motor strength is 5/5 in the all 4 extremities. Upward plantar reflex on L. CN exam (and overall neuro exam) limited by patient's AMS.   Skin: No visible rashes, scars.   Lab results: Basic Metabolic Panel:  Basename 05/28/12 1404  NA 136  K 3.5  CL 99  CO2 27  GLUCOSE 234*  BUN 11  CREATININE 0.54  CALCIUM 9.2  MG --  PHOS --   CBC:  Basename 05/28/12 1404  WBC 7.4  NEUTROABS 4.2  HGB 12.9  HCT 37.5  MCV 89.3  PLT 170     Alcohol Level:  Basename 05/28/12 1426  ETH <11   Urinalysis:  Basename 05/28/12 1431  COLORURINE YELLOW  LABSPEC 1.019  PHURINE 7.5  GLUCOSEU 500*  HGBUR NEGATIVE  BILIRUBINUR NEGATIVE  KETONESUR NEGATIVE  PROTEINUR NEGATIVE  UROBILINOGEN 1.0  NITRITE NEGATIVE  LEUKOCYTESUR NEGATIVE     Imaging results:   Dg Chest 2 View (05/28/2012) - Cardiomegaly.  Low lung volumes.  No acute findings.  Original Report  Authenticated By: Cyndie Chime, M.D.   Ct Head Wo Contrast (05/28/2012) - Slight asymmetric hypodensity right insular cortex and adjacent white matter could be acute, subacute or chronic.  No acute hemorrhage or well-demarcated large vessel cortical infarct is observed.  Original Report Authenticated By: Elsie Stain, M.D.    Other results:  EKG (05/28/2012) - Normal Sinus Rhythm, regular rate of approximately 74 bpm, normal axis, Q waves in II,III,aVF. ST segments: ST depression V1, V3.    Assessment & Plan: Pt is a 78 y.o. yo female with a PMHx of nonobstructive CAD, uncontrolled DMII (A1c 10.6), hypothyroidism, and recent right frontal stroke in July 2013 who was admitted on 05/28/2012 with symptoms of possible slurred speech and right arm weakness noted by her PCP on the day of admission. At the time of admission, no neurologic deficits can be appreciated, although the patient is only oriented x1. Cause of this change in mental status is being investigated.   1) Slurred speech and right-sided weakness - the symptoms were noted by the PCP on the day of admission. By the time of evaluation, although the patient is tangential in her thought process, the slurring of her speech is not significant. As well, right arm weakness cannot be appreciated (albeit exam is limited secondary to confusion, limiting cooperation with the exam).  Given the history of recent stroke, with prior concern for possible embolic process in the setting of 2 lesions noted on the MRI in [facility], recurrent stroke versus extension of recent stroke is considered, however, would expect neurologic deficits on exam. Of note, during her recent hospitalization, there was no specific embolic source that was identified - with bilateral carotid Dopplers indicated no evidence of flow limiting stenosis, with an estimated 0-49% stenosis bilaterally. As well, 2-D echocardiogram showed an EF of 55-65% without regional wall motion  abnormalities. No comment was made regarding if PFO was noted, although, does not seem that a bubble  study was performed.  Also considered as if the patient is experiencing is from recent stroke in the setting of volume depletion as she is significantly dry appearing on exam. Hypoglycemia secondary to recent initiation of insulin can also mimic strokelike symptoms. It is unclear if a CBG was obtained at her PCPs office prior to transfer to the ER. Lastly, the patient's antihypertensive regimen was escalated and during the recent hospitalization, and potentially if she was experiencing either bradycardia or hypotension at the time of evaluation with her PCP, he could have contributed towards her symptoms.  Plan: - Will obtain MRI of the brain to evaluate for stroke. - Swallow screen before starting any oral medications. - Continue to investigate for evidence of hypoglycemia, or infectious causes. - As mentioned below, will out for permissive hypertension acutely until MRI can be performed (to see if new stroke is evident.) - Will check cardiac enzymes given new EKG changes.  2) Altered mental status - unclear etiology, possible prolonged delirium on top of a baseline mild dementia in the setting of her recent acute illness and hospitalization during which she has required restraints. Acute infectious etiology is also considered, however not evident at this point (specifically, urinalysis shows no nitrates or leukocytes cause concern for UTI, as well chest x-ray is negative for acute infiltrates, the patient denies any other systemic symptoms). Polypharmacy is also considered, as the patient's daughter indicates that she is taking approximately 30 pills a day, including sedative medications. Lastly, the patient has a history of hypothyroidism and presumably thyroid dysfunction could contribute towards altered mental status in this elderly patient. At the time of evaluation, the patient is alert and oriented  x1, only to person. Per daughter's description, the patient was independent of ADLs and IADLs prior to recent hospitalizations. However, the patient was known to occasionally forget where she is placed items, and prior hospitalization notes do document a history of dementia.  Plan: - Avoid restraints if at all possible.  - Continue to monitor her mental status as medications are minimized. - Family asked to stay at bedside as much as possible to help with orientation. - Continue to evaluate for any sources of infection, we'll monitor for fevers and leukocytosis - none apparent at time of admission  3) DMII, uncontrolled - A1c 10.6 in 05/2012 - the patient's home regimen is not clear at this time, she is noted to have recently started insulin therapy during her recent hospitalization. However, discharge summary is not available, and the family does not know her home regimen. - Will hold oral medications for now. - Will provide sliding scale insulin only. - Will request discharge summary, to confirm home regimen  4) Uncontrolled hypertension - it seems that the patient has baseline uncontrolled hypertension, was noted to have a hypertensive urgency during the last 2 hospitalizations with blood pressures up to 200s systolic. It seems that her clonidine was escalated in dosage. Day, the patient continues to have elevated blood pressures into the 180s systolic. However, given that there is concern for possible extension of her stroke, will allow for permissive hypertension during this 24-hour period. After which, after clarifying the home regimen, we will try to simplify as much as possible. - Continue clonidine at home dosage, to prevent rebound hypertension. - Hold other oral antihypertensives for now - Will ask RN to call if blood pressures greater than 220/120 mmHg, at which time when necessary medication can be added. - Will need overall better blood pressure management at baseline,  particularly  given all of her risk factors.  5) Hypothyroidism - on home synthroid - Will check TSH, free T4.  6) HLD - LDL of 49 and trig of 128 on 05/2011. - Continue home statin, discontinue tricor at this time.  7) Recent right frontal stroke - cause unclear, although noted to have significant ischemic change (and multiple risk factors including uncontrolled HTN, DMII, HLD), was thought to be embolic at last hospitalization given lesions in multiple vascular distributions. However, no clear embolic source was identified. - Continue risk factor management for now, specifically improved DM and HTN control. - As mental status clears, will need to involve PT/ OT with consideration for SNF or [facility] services, as the patient lives home alone.  8) Non-occlusive CAD - no acute CP, shortness of breath, etc.  - Continue ASA, statin, beta blocker to be restarted once stroke ruled out or after 24h period if rules in for stroke.   9) DVT PPX - low molecular weight heparin  Signed: Elenor Legato, M.D. PGY-I, Internal Medicine Resident Pager: [phone] (7AM-5PM) 05/28/2012, 7:29 PM

## 2012-05-28 NOTE — ED Notes (Signed)
Patient transported to CT 

## 2012-05-29 ENCOUNTER — Inpatient Hospital Stay (HOSPITAL_COMMUNITY): Payer: Medicare Other

## 2012-05-29 LAB — GLUCOSE, CAPILLARY
Glucose-Capillary: 191 mg/dL — ABNORMAL HIGH (ref 70–99)
Glucose-Capillary: 199 mg/dL — ABNORMAL HIGH (ref 70–99)
Glucose-Capillary: 199 mg/dL — ABNORMAL HIGH (ref 70–99)
Glucose-Capillary: 281 mg/dL — ABNORMAL HIGH (ref 70–99)

## 2012-05-29 LAB — COMPREHENSIVE METABOLIC PANEL
ALT: 10 U/L (ref 0–35)
CO2: 26 mEq/L (ref 19–32)
Calcium: 8.7 mg/dL (ref 8.4–10.5)
GFR calc Af Amer: >90 mL/min (ref >90)
GFR calc non Af Amer: >90 mL/min (ref >90)
Glucose, Bld: 199 mg/dL — ABNORMAL HIGH (ref 70–99)
Sodium: 136 mEq/L (ref 135–145)

## 2012-05-29 LAB — LIPID PANEL
Cholesterol: 116 mg/dL (ref 0–200)
HDL: 33 mg/dL — ABNORMAL LOW (ref >39)
Total CHOL/HDL Ratio: 3.5 RATIO
Triglycerides: 117 mg/dL (ref <150)
VLDL: 23 mg/dL (ref 0–40)

## 2012-05-29 LAB — CARDIAC PANEL(CRET KIN+CKTOT+MB+TROPI)
CK, MB: 0.7 ng/mL (ref 0.3–4.0)
Relative Index: INVALID (ref 0.0–2.5)
Total CK: 17 U/L (ref 7–177)
Troponin I: <0.3 ng/mL (ref <0.30)
Troponin I: <0.3 ng/mL (ref <0.30)

## 2012-05-29 MED ORDER — ASPIRIN EC 81 MG PO TBEC
81.0000 mg | DELAYED_RELEASE_TABLET | Freq: Every day | ORAL | Status: DC
  Administered 2012-05-30 – 2012-06-03 (×5): 81 mg via ORAL
  Filled 2012-05-29 (×5): qty 1

## 2012-05-29 MED ORDER — POTASSIUM CHLORIDE CRYS ER 20 MEQ PO TBCR
40.0000 meq | EXTENDED_RELEASE_TABLET | ORAL | Status: AC
  Administered 2012-05-29 – 2012-05-30 (×2): 40 meq via ORAL
  Filled 2012-05-29 (×2): qty 2

## 2012-05-29 MED ORDER — HYDRALAZINE HCL 20 MG/ML IJ SOLN
2.0000 mg | Freq: Once | INTRAMUSCULAR | Status: DC
  Filled 2012-05-29: qty 0.1

## 2012-05-29 MED ORDER — CLONIDINE HCL 0.1 MG/24HR TD PTWK
0.1000 mg | MEDICATED_PATCH | TRANSDERMAL | Status: DC
  Administered 2012-05-29: 0.1 mg via TRANSDERMAL
  Filled 2012-05-29: qty 1

## 2012-05-29 MED ORDER — ASPIRIN 300 MG RE SUPP
300.0000 mg | Freq: Every day | RECTAL | Status: DC
  Administered 2012-05-29: 300 mg via RECTAL
  Filled 2012-05-29: qty 1

## 2012-05-29 NOTE — Progress Notes (Signed)
Inpatient Diabetes Program Recommendations  AACE/ADA: New Consensus Statement on Inpatient Glycemic Control (2009)  Target Ranges:  Prepandial:   less than 140 mg/dL      Peak postprandial:   less than 180 mg/dL (1-2 hours)      Critically ill patients:  140 - 180 mg/dL   Reason for Visit: Results for Jamie Cochran, MASSAR (MRN [id number]) as of 05/29/2012 11:40  Ref. Range 05/28/2012 20:44 05/29/2012 06:52  Glucose-Capillary Latest Range: 70-99 mg/dL 409 (H) 811 (H)   According to medication reconciliation patient was prescribed Lantus 30 units daily, Amaryl 4 mg with breakfast, and Kombiglyze 2.03/999 mg daily.  Fasting CBG was 191 this morning.  May consider resuming (portion of home basal insulin) Lantus 10 units daily.  Note A1C=10.6%.  Will follow.

## 2012-05-29 NOTE — Progress Notes (Signed)
Pt's temp read 100.5 and BP read 190/82 at 0455,Dr Sadek (on call) paged and notified,said to continue to monitor pt, calm and sleeping in bed at this time. Jamie Cochran, Jamie Cochran

## 2012-05-29 NOTE — Evaluation (Signed)
Physical Therapy Evaluation Patient Details Name: Jamie Cochran MRN: [id number] DOB: 20-Aug-1930 Today's Date: 05/29/2012 Time: 7829-5621 PT Time Calculation (min): 49 min  PT Assessment / Plan / Recommendation Clinical Impression  Pt adm with AMS, slurred speech after recent d/c from hospital in [facility] s/p Rt CVA. Pt was at her daughter's home for one day before brought back to hospital for worsening symptoms. Will benefit from PT to incr safety and independence prior to d/c home.    PT Assessment  Patient needs continued PT services    Follow Up Recommendations  Home health PT;Supervision/Assistance - 24 hour    Barriers to Discharge None      Equipment Recommendations  Other (comment) (TBA by PT (?ability to use RW with ataxic LUE))    Recommendations for Other Services OT consult   Frequency Min 4X/week    Precautions / Restrictions Precautions Precautions: Fall   Pertinent Vitals/Pain Supine BP 175/85 Sit              165/105  Supine        16785  Pt reported pain in Rt side with rolling to Rt; denied hip pain (it's my side--but unable to pinpoint location)--RN aware      Mobility  Bed Mobility Bed Mobility: Rolling Right;Rolling Left;Right Sidelying to Sit;Left Sidelying to Sit;Sit to Supine;Sitting - Scoot to [place] of Bed;Scooting to [facility] Rolling Right: With rail;4: Min assist Rolling Left: 5: Supervision;With rail Right Sidelying to Sit: 4: Min assist;With rails;HOB elevated Left Sidelying to Sit: 4: Min assist;With rails;HOB elevated Sitting - Scoot to Edge of Bed: 4: Min guard;With rail Sit to Supine: 4: Min assist;HOB flat Scooting to HOB: 4: Min assist;With rail Details for Bed Mobility Assistance: pt assisted OOB to [facility] x2 (as soon as returned to supine felt she needed to go again--and did); pt needs frequent cues for sequencing and to stay on task (at times can state/remember what you've asked her to do, yet has not initiated task in >30 seconds); very  ataxic in movements with LUE Transfers Transfers: Sit to Stand;Stand to Sit;Stand Pivot Transfers Sit to Stand: 4: Min assist;With upper extremity assist;From bed;From chair/3-in-1 Stand to Sit: 4: Min guard;With upper extremity assist;With armrests;To bed;To chair/3-in-1 Stand Pivot Transfers: 4: Min assist;With armrests Details for Transfer Assistance: unsteady with trunk ataxia; no knee buckling and accurate foot placement with step/stand-pivot to/from [facility] x2 Ambulation/Gait Ambulation/Gait Assistance: Not tested (comment) (due to episode of incontinence prior to OOB & on standing) Modified Rankin (Stroke Patients Only) Pre-Morbid Rankin Score: Moderately severe disability Modified Rankin: Moderately severe disability    Exercises     PT Diagnosis: Hemiplegia non-dominant side;Difficulty walking  PT Problem List: Decreased strength;Decreased balance;Decreased mobility;Decreased coordination;Decreased cognition;Decreased knowledge of use of DME;Decreased safety awareness PT Treatment Interventions: DME instruction;Gait training;Stair training;Functional mobility training;Therapeutic activities;Balance training;Neuromuscular re-education;Cognitive remediation;Patient/family education   PT Goals Acute Rehab PT Goals PT Goal Formulation: With patient/family Time For Goal Achievement: 06/05/12 Potential to Achieve Goals: Good Pt will Roll Supine to Right Side: Independently PT Goal: Rolling Supine to Right Side - Progress: Goal set today Pt will Roll Supine to Left Side: Independently PT Goal: Rolling Supine to Left Side - Progress: Goal set today Pt will go Supine/Side to Sit: with supervision;with HOB 0 degrees PT Goal: Supine/Side to Sit - Progress: Goal set today Pt will Sit at Edge of Bed: with supervision;3-5 min;with no upper extremity support PT Goal: Sit at Edge Of Bed - Progress: Goal set today Pt  will go Sit to Supine/Side: with modified independence;with HOB 0 degrees PT  Goal: Sit to Supine/Side - Progress: Goal set today Pt will Transfer Bed to Chair/Chair to Bed: with supervision PT Transfer Goal: Bed to Chair/Chair to Bed - Progress: Goal set today Pt will Ambulate: 51 - 150 feet;with supervision;with least restrictive assistive device PT Goal: Ambulate - Progress: Goal set today Pt will Go Up / Down Stairs: 3-5 stairs;with min assist;with least restrictive assistive device PT Goal: Up/Down Stairs - Progress: Goal set today  Visit Information  Last PT Received On: 05/29/12 Assistance Needed: +1    Subjective Data  Subjective: "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." grandaughter states she does not work Patient Stated Goal: Agrees wants to improve balance   Prior Functioning  Home Living Lives With: Spouse;Other (Comment) (plan is to stay with her daughter/family) Available Help at Discharge: Family;Available 24 hours/day (per grandaughter they are arranging 24 hr care) Type of Home: House (daughter's home) Home Access: Stairs to enter [employer] of Steps: 5 Entrance Stairs-Rails: None Home Layout: Able to live on main level with bedroom/bathroom Prior Function Level of Independence: Needs assistance (was independent and caring for husband prior to recent CVA) Needs Assistance: Bathing;Dressing;Toileting;Gait;Transfers Gait Assistance: per grandaughter no device; used HHA of family Able to Take Stairs?: Yes Driving: Yes (prior to CVA) Vocation: Retired Musician: [employer] Dominant Hand: Right    Cognition  Overall Cognitive Status: Impaired Area of Impairment: Attention;Memory;Following commands;Safety/judgement;Awareness of deficits Arousal/Alertness: Lethargic Orientation Level: Disoriented to;Place;Time;Situation Behavior During Session: WFL for tasks performed Current Attention Level: Sustained Attention - Other Comments: becomes internally distracted and cannot complete/remember task she is  trying to do Memory Deficits: grandaughter correcting much of information pt provided  Following Commands: Follows one step commands inconsistently (distractible) Safety/Judgement: Decreased safety judgement for tasks assessed (thought she could walk into bathroom herself) Awareness of Deficits: states she has no problems with Rt arm or leg    Extremity/Trunk Assessment Right Upper Extremity Assessment RUE ROM/Strength/Tone: [facility] for tasks assessed Left Upper Extremity Assessment LUE Coordination: Deficits LUE Coordination Deficits: ataxic movements; arm "jumping" when at rest; decr finger to nose  Right Lower Extremity Assessment RLE ROM/Strength/Tone: [facility] for tasks assessed RLE Coordination: WFL - gross motor Left Lower Extremity Assessment LLE ROM/Strength/Tone: Deficits LLE ROM/Strength/Tone Deficits: strength grossly 4/5 LLE Coordination: WFL - gross motor Trunk Assessment Trunk Assessment: Other exceptions Trunk Exceptions: ataxic movements as sitting EOB   Balance Balance Balance Assessed: Yes Static Sitting Balance Static Sitting - Balance Support: Bilateral upper extremity supported;Feet supported Static Sitting - Level of Assistance: 4: Min assist Static Sitting - Comment/# of Minutes: Pt with ataxic trunk/jerking with poor use of LUE for support due to ataxic LUE Static Standing Balance Static Standing - Balance Support: No upper extremity supported Static Standing - Level of Assistance: 4: Min assist  End of Session PT - End of Session Equipment Utilized During Treatment: Gait belt Activity Tolerance: Patient tolerated treatment well Patient left: in bed;with call bell/phone within reach;with bed alarm set;with family/visitor present Nurse Communication: Mobility status;Other (comment) (incontinent of urine x 2 during session with linens changed)  GP     Shaily Librizzi 05/29/2012, 10:48 AM Pager [phone]

## 2012-05-29 NOTE — Evaluation (Signed)
Clinical/Bedside Swallow Evaluation Patient Details  Name: Jamie Cochran MRN: [id number] Date of Birth: [birth year]  Today's Date: 05/29/2012 Time: 4098-1191 SLP Time Calculation (min): 21 min  Past Medical History:  Past Medical History  Diagnosis Date  . Diabetes type 2, uncontrolled   . Non-occlusive coronary artery disease     40% first diagonal stenosis, 50% obtuse marginal stenosis  . Hypertension   . Stroke in 05/2012    deep right frontal perventricular deep white matter infarct + subacute left thalamic lacunar infarct  . Hyperlipidemia   . Hypothyroidism   . Osteoporosis   . Hearing loss     history of right sided cholesteotoma s/p Right tympanoplasty with ossicular reconstruction. (03/2002)  . Urinary incontinence     s/p surgeyr for ureterocele   Past Surgical History:  Past Surgical History  Procedure Date  . Abdominal hysterectomy   . Cesarean section   . Bladder repair   . Inner ear surgery   . Tonsillectomy and adenoidectomy    HPI:  78 y.o. female admitted 05/28/12 with AMS, slurred speech after recent d/c from hospital in [facility], s/p Rt CVA. Pt was at her daughter's home for one day before brought back to hospital for worsening symptoms and family had changed diet textures to puree due to their concerns for her swallowing safety PTA.   Assessment / Plan / Recommendation Clinical Impression  Demonstrates an overall functional oral-pharyngeal swallow without any clinical indication of aspiration risk (only risks are related to confusional state).  Due to inavailability of dentures, a pureed diet consistency is safest at this time until it is confirmed that dentures fit properly.  Will follow 1-2 times to ensure diet upgrade.    Aspiration Risk  None    Diet Recommendation Dysphagia 1 (Puree);Thin liquid   Liquid Administration via: Cup;Straw Medication Administration: Whole meds with liquid Supervision: Patient able to self feed;Intermittent supervision  to cue for compensatory strategies Compensations: Small sips/bites;Slow rate Postural Changes and/or Swallow Maneuvers: Seated upright 90 degrees;Upright 30-60 min after meal    Other  Recommendations Oral Care Recommendations: Oral care BID;Staff/trained caregiver to provide oral care   Follow Up Recommendations  None (None for swallowing)    Frequency and Duration min 2x/week  1 week   Pertinent Vitals/Pain n/a    SLP Swallow Goals Patient will consume recommended diet without observed clinical signs of aspiration with: Modified independent assistance Patient will consume Regular textures with dentures in place without any oral-pharyngeal clinical impairments and no assistance.   Swallow Study Oral/Motor/Sensory Function Labial ROM: Reduced right Labial Symmetry: Abnormal symmetry right Labial Strength: Reduced Labial Sensation: Within Functional Limits Lingual ROM: Within Functional Limits Lingual Symmetry: Within Functional Limits Lingual Strength: Within Functional Limits Lingual Sensation: Within Functional Limits Facial ROM: Reduced right Facial Symmetry: Right droop Facial Strength: Reduced Facial Sensation: Within Functional Limits Velum: Within Functional Limits Mandible: Within Functional Limits   Ice Chips Ice chips: Within functional limits Presentation: Self Fed;Spoon   Thin Liquid Thin Liquid: Within functional limits Presentation: Self Fed;Cup;Straw    Nectar Thick Nectar Thick Liquid: Not tested   Honey Thick Honey Thick Liquid: Not tested   Puree Puree: Within functional limits Presentation: Self Fed;Spoon   Solid Solid: Not tested (Dentures not available)   Myra Rude, M.S.,CCC-SLP Pager [phone] 05/29/2012,12:08 PM

## 2012-05-29 NOTE — H&P (Signed)
Internal Medicine Teaching Service Attending Note Date: 05/29/2012  Patient name: Jamie Cochran  Medical record number: [id number]  Date of birth: 1930-04-23   I have seen and evaluated Jamie Cochran and discussed their care with the Residency Team. Please see Dr McTyre's H&P for full details. Pt was in normal state of health until early July when she was on vacation with family for two weeks at [facility]. She was eval in the Er there and dx with UTI, hyperglycemia, and AMS and D/C'd on ABX and insulin (new Rx). Pt cont to have AMS and readmitted on July 11th and found to have new CVA (acute ischemic infarct in the right frontal lobe by MRI although by report there was also a thalamic CVA). The granddaughter was present today and she stated that when pt was D/C'd about three days ago, she was not at baseline - required two person assist with walking and was confused with increased sleepiness. Pt presented to PCP the following morning and didn't recognize any of her doctors and her PCP sent her to the ER at [facility]. No new sxs today per granddaughter. Pt unable to give detailed hx.  Her W/U at [facility] apparently inc an MRI, TTE, and carotids.   PMHX, meds, soc hx, fam hx allergies reviewed.   On exam, BP 183/69, HR 68, Tmax 100.5, O2 sat 95% Alert but confused.  HRRR systolic murmur ABD : + BS, S/NT/ND Ext no edema Neuro : limited by pt's confusion. There is decreased strength R hand grip, ABD of RUE. Both LE strength could not be assessed 2/2 inability to cooperate. Speech is clear.  Assessment and Plan: I agree with the formulated Assessment and Plan with the following changes:   1. AMS - broad diff dx New CVA. MRI pending. On ASA.  Effects from CVA early this month - Need to see if W/U inc TEE and whether MRI results are c/w embolic source. PT/OT/speech therapy. Hypoglycemia - insulin was a new med that was started earlier this month. Currently euglycemic. Infection - UA,  CXR normal. Check blood cxs.  Metabolic - all electrolytes are nl. Cardiac - cycle CE and EKG  2. DM II - newly Rx insulin. CBG's (NPO currently) 175-191 which is acceptable. Follow since PO intake is low. Tx as needed.  3. HTN - permissive HTN until MRI. Goal is < 220/120. She is being cont on clonidine although changed to TTD.   Dipso - Will be with [place] for several days as we determine etiology of AMS and dispo plans as if she doesn;t make sig recovery, she will need placement.  Burns Spain, MD 7/17/20131:22 PM

## 2012-05-29 NOTE — Evaluation (Signed)
Speech Language Pathology Evaluation Patient Details Name: Jamie Cochran MRN: [id number] DOB: 1930/04/14 Today's Date: 05/29/2012 Time: 4098-1191 SLP Time Calculation (min): 21 min  Problem List:  Patient Active Problem List  Diagnosis  . Hypertension  . Osteoporosis  . Hyperlipidemia  . Hypothyroidism  . Diabetes mellitus  . Stress incontinence, female  . Osteoarthritis  . Congestive heart failure  . Hypokalemia  . Right sided weakness   Past Medical History:  Past Medical History  Diagnosis Date  . Diabetes type 2, uncontrolled   . Non-occlusive coronary artery disease     40% first diagonal stenosis, 50% obtuse marginal stenosis  . Hypertension   . Stroke in 05/2012    deep right frontal perventricular deep white matter infarct + subacute left thalamic lacunar infarct  . Hyperlipidemia   . Hypothyroidism   . Osteoporosis   . Hearing loss     history of right sided cholesteotoma s/p Right tympanoplasty with ossicular reconstruction. (03/2002)  . Urinary incontinence     s/p surgeyr for ureterocele   Past Surgical History:  Past Surgical History  Procedure Date  . Abdominal hysterectomy   . Cesarean section   . Bladder repair   . Inner ear surgery   . Tonsillectomy and adenoidectomy    HPI:  78 y.o. female admitted 05/28/12 with AMS, slurred speech after recent d/c from hospital in [facility], s/p Rt CVA. Pt was at her daughter's home for one day before brought back to hospital for worsening symptoms and family had changed diet textures to puree due to their concerns for her swallowing safety PTA.  Family reports cognitive changes since diagnosed CVA with disorientation.   Assessment / Plan / Recommendation Clinical Impression  Demonstrates a mild-moderate cognitive impairment in attention, recall for new information, problem-solving and safety judgement.  Patient would benefit from skilled SLP services to maximize her safety and communicative abilities to return to  home with 24 hrs supervision.  If family can provide this assistance, then recommend Home Health services.      SLP Assessment  Patient needs continued Speech Lanaguage Pathology Services    Follow Up Recommendations  Home Health for cognition/safety   Frequency and Duration min 2x/week  2 weeks   Pertinent Vitals/Pain n/a   SLP Goals  SLP Goals Potential to Achieve Goals: Good Progress/Goals/Alternative treatment plan discussed with pt/caregiver and they: Agree SLP Goal #1: Patient will sustain attention for 15-20 minutes to complete basic ADL task with minimal verbal cues. SLP Goal #2: Patient will solve basic problems in functional ADL tasks with minimal contextual cues. SLP Goal #3: Patient will follow basic 2 step directions in ADL tasks with minimal verbal cues.  SLP Evaluation Prior Functioning  Cognitive/Linguistic Baseline: Within functional limits Type of Home: House Lives With: Spouse;Other (Comment) Available Help at Discharge: Family;Available 24 hours/day Vocation: Retired   IT consultant  Overall Cognitive Status: Impaired Arousal/Alertness: Awake/alert Orientation Level: Oriented to person;Disoriented to situation;Disoriented to time;Disoriented to place Attention: Sustained Memory: Impaired Memory Impairment: Storage deficit;Retrieval deficit;Decreased recall of new information;Decreased short term memory Decreased Short Term Memory: Verbal basic;Functional basic Awareness: Impaired Awareness Impairment: Intellectual impairment Problem Solving: Impaired Problem Solving Impairment: Functional basic Safety/Judgment: Impaired    Comprehension  Auditory Comprehension Overall Auditory Comprehension: Impaired Yes/No Questions: Within Functional Limits Commands: Impaired Two Step Basic Commands: 50-74% accurate Conversation: Simple Interfering Components: Processing speed;Working memory EffectiveTechniques: Sports administrator: Within [employer] Reading Comprehension Reading Status: Not tested    Expression  Expression Primary Mode of Expression: Verbal Verbal Expression Overall Verbal Expression: Appears within functional limits for tasks assessed Other Verbal Expression Comments: Noted 1 episode of a possible neologism however due to a 1 time event, further skilled observation would be warranted in future treatments. Written Expression Dominant Hand: Right Written Expression: Not tested   Oral / Motor Oral Motor/Sensory Function Overall Oral Motor/Sensory Function: Impaired Labial ROM: Reduced right Labial Symmetry: Abnormal symmetry right Labial Strength: Reduced Labial Sensation: Within Functional Limits Lingual ROM: Within Functional Limits Lingual Symmetry: Within Functional Limits Lingual Strength: Within Functional Limits Lingual Sensation: Within Functional Limits Facial ROM: Reduced right Facial Symmetry: Right droop Facial Strength: Reduced Facial Sensation: Within Functional Limits Velum: Within Functional Limits Mandible: Within Functional Limits Motor Speech Overall Motor Speech: Appears within functional limits for tasks assessed Respiration: Within functional limits     Myra Rude, M.S.,CCC-SLP Pager [phone] 05/29/2012, 12:18 PM

## 2012-05-29 NOTE — Progress Notes (Signed)
Subjective: The patient has no complaints today. She denies dysuria, of which she complained yesterday.  No acute interval events. The patient's granddaughter was in the room at the time of the exam, and stated that the patient had not voiced any complaints overnight, to either her or her other family members.  Objective: Vital signs in last 24 hours: Filed Vitals:   05/29/12 0229 05/29/12 0455 05/29/12 0558 05/29/12 0830  BP: 190/74 190/82 181/69 187/62  Pulse: 71 74  70  Temp: 98.9 F (37.2 C) 100.5 F (38.1 C)  98.8 F (37.1 C)  TempSrc: Oral Oral  Oral  Resp: 20 20  20   Height:      Weight:      SpO2: 97% 95%  98%     Weight change:   Intake/Output Summary (Last 24 hours) at 05/29/12 1021 Last data filed at 05/28/12 2126  Gross per 24 hour  Intake    100 ml  Output      0 ml  Net    100 ml   Physical Exam: General: Fluctuating level of alertness. Unchanged since yesterday. CV: RRR; normal S1 and S2. 3/6 systolic murmur Resp: CTA bilaterally; no wheezes, rales, rhonchi Abd: Soft, non-tender, non-distended; normoactive BS Ext: 1+ pretibial edema Neuro: Oriented x 1 (person). No change since previous exam. 5/5 strength in all four extremities. Neuro exam limited secondary to overall drowsiness/decreased level of consciousness.  Lab Results: Basic Metabolic Panel:  Lab 05/29/12 1610 05/28/12 1404  NA 136 136  K 3.2* 3.5  CL 98 99  CO2 26 27  GLUCOSE 199* 234*  BUN 8 11  CREATININE 0.45* 0.54  CALCIUM 8.7 9.2  MG -- --  PHOS -- --   Liver Function Tests:  Lab 05/29/12 0535  AST 14  ALT 10  ALKPHOS 49  BILITOT 0.8  PROT 6.3  ALBUMIN 3.1*   CBC:  Lab 05/28/12 1404  WBC 7.4  NEUTROABS 4.2  HGB 12.9  HCT 37.5  MCV 89.3  PLT 170   Cardiac Enzymes:  Lab 05/29/12 0555 05/28/12 2101  CKTOTAL 17 18  CKMB 0.7 0.7  CKMBINDEX -- --  TROPONINI <0.30 <0.30   CBG:  Lab 05/29/12 0652 05/28/12 2044  GLUCAP 191* 175*   Fasting Lipid Panel:  Lab  05/29/12 0535  CHOL 116  HDL 33*  LDLCALC 60  TRIG 960  CHOLHDL 3.5  LDLDIRECT --   Thyroid Function Tests:  Lab 05/28/12 2101  TSH 1.260  T4TOTAL --  FREET4 1.32  T3FREE --  THYROIDAB --    Alcohol Level:  Lab 05/28/12 1426  ETH <11   Urinalysis:  Lab 05/28/12 1431  COLORURINE YELLOW  LABSPEC 1.019  PHURINE 7.5  GLUCOSEU 500*  HGBUR NEGATIVE  BILIRUBINUR NEGATIVE  KETONESUR NEGATIVE  PROTEINUR NEGATIVE  UROBILINOGEN 1.0  NITRITE NEGATIVE  LEUKOCYTESUR NEGATIVE   Studies/Results: Dg Chest 2 View  05/28/2012  *RADIOLOGY REPORT*  Clinical Data: Altered mental status.  CHEST - 2 VIEW  Comparison: 04/05/2009  Findings: Mild cardiomegaly.  Low lung volumes without focal opacity, effusion or edema.  No acute bony abnormality.  Mild old compression fracture in the upper lumbar spine or lower thoracic spine, stable.  Mild degenerative changes in the thoracolumbar spine.  IMPRESSION: Cardiomegaly.  Low lung volumes.  No acute findings.  Original Report Authenticated By: Cyndie Chime, M.D.   Ct Head Wo Contrast  05/28/2012  *RADIOLOGY REPORT*  Clinical Data: Altered mental status.  Slurred speech.  Recent  discharge for stroke from outside facility.  CT HEAD WITHOUT CONTRAST  Technique:  Contiguous axial images were obtained from the base of the skull through the vertex without contrast.  Comparison: 02/09/2009.  Findings: Slight asymmetric hypodensity right insular cortex and and adjacent white matter are could represent acute, subacute, or chronic ischemia.  No CT signs of proximal vascular thrombosis. No areas of hemorrhage or mass lesion. Mild atrophy with chronic microvascular ischemic change is suspected.  Calvarium is intact. Clear sinuses.  Chronic mastoid changes on the right.  Slight progression atrophy and white matter change compared with 2010.  IMPRESSION: Slight asymmetric hypodensity right insular cortex and adjacent white matter could be acute, subacute or chronic.   No acute hemorrhage or well-demarcated large vessel cortical infarct is observed.  Original Report Authenticated By: Elsie Stain, M.D.   Medications: I have reviewed the patient's current medications. Scheduled Meds:   . atorvastatin  20 mg Oral q1800  . cloNIDine  0.1 mg Oral BID  . enoxaparin (LOVENOX) injection  40 mg Subcutaneous Q24H  . insulin aspart  0-9 Units Subcutaneous TID WC  . levothyroxine  75 mcg Oral Daily  . pneumococcal 23 valent vaccine  0.5 mL Intramuscular Tomorrow-1000   Continuous Infusions:   . sodium chloride 125 mL/hr at 05/29/12 0515   PRN Meds:.senna-docusate  Assessment/Plan: Pt is a 78 y.o. yo female with a PMHx of nonobstructive CAD, uncontrolled DMII (A1c 10.6), hypothyroidism, and recent right frontal stroke in July 2013 who was admitted on 05/28/2012 with symptoms of possible slurred speech and right arm weakness noted by her PCP on the day of admission. At the time of admission, no neurologic deficits can be appreciated, although the patient is only oriented x1. Cause of this change in mental status is being investigated.   1) Slurred speech and right-sided weakness - the symptoms were noted by the PCP on the day of admission.  By the time of evaluation, although the patient is tangential in her thought process, the slurring of her speech is not significant. As well, right arm weakness cannot be appreciated (albeit exam is limited secondary to confusion, limiting cooperation with the exam). Given the history of recent stroke, with prior concern for possible embolic process in the setting of 2 lesions noted on the MRI in [facility], recurrent stroke versus extension of recent stroke is considered, however, would expect neurologic deficits on exam. 2d echo at outside hospital revealed EF 65%, no diatatolic dysfunction (bubble not performed). Carotid doppler at outside hospital negative for flow-limiting stenosis.   Non-contrast CT head negative for  hemorrhage. Brain MRI is currently pending. EKG revealed q-waves in inferior leads, and abnormal R-wave progression in precordial leads. Q-waves are stable since EKG in 2010, and R-wave abnormality in V3 likely due to lead placement. CE neg x 2 at present.  - consider TEE if determined that it was not performed at OSH following stroke - Continue to investigate for evidence of hypoglycemia, or infectious causes.  2) Altered mental status - unclear etiology, possible prolonged delirium on top of a baseline mild dementia in the setting of her recent acute illness and hospitalization during which she has required restraints. Acute infectious etiology is also considered, however UA and CXR were both negative. Patient has hypothyroidism. At the time of evaluation, the patient is alert and oriented x1, only to person. Per daughter's description, the patient was independent of ADLs and IADLs prior to recent hospitalizations. However, the patient was known to occasionally  forget where she is placed items, and prior hospitalization notes do document a history of dementia.  - Avoid restraints if at all possible.  - Continue to monitor her mental status as medications are minimized.  - Family asked to stay at bedside as much as possible to help with orientation.  - Daily CBCs  3) DMII, uncontrolled - A1c 10.6 in 05/2012 - the patient's home regimen is not clear at this time, she is noted to have recently started insulin therapy during her recent hospitalization. However, discharge summary is not available, and the family does not know her home regimen.  - Will hold oral medications for now.  - Will provide sliding scale insulin only.  - Will request discharge summary, to confirm home regimen   4) Uncontrolled hypertension - it seems that the patient has baseline uncontrolled hypertension, was noted to have a hypertensive urgency during the last 2 hospitalizations with blood pressures up to 200s systolic. It  seems that her clonidine was escalated in dosage. Day, the patient continues to have elevated blood pressures into the 180s systolic. However, given that there is concern for possible extension of her stroke, will allow for permissive hypertension during this 24-hour period. After which, after clarifying the home regimen, we will try to simplify as much as possible.  - Continue clonidine patch (patient currently NPO) - Hold other oral antihypertensives for now, unless BP >220/120  5) Hypokalemia - K+ = 3.20mEq/L this AM. Will replete with IV KCl.  6) Hypothyroidism - on home synthroid (after NPO lifted). TSH, T4 pending  7) HLD - LDL of 49 and trig of 128 on 05/2011.  - Currently NPO; when diet resumed, will resume home statin  8) Recent right frontal stroke - cause unclear, although noted to have significant ischemic change (and multiple risk factors including uncontrolled HTN, DMII, HLD), was thought to be embolic at last hospitalization given lesions in multiple vascular distributions. However, no clear embolic source was identified.  - Continue risk factor management for now, specifically improved DM and HTN control.  - As mental status clears, will need to involve PT/ OT with consideration for SNF or [facility] services, as the patient lives home alone.  9) Non-occlusive CAD - no acute CP, shortness of breath, etc.  - Continue ASA, statin, beta blocker to be restarted once stroke ruled out or after 24h period if rules in for stroke.   10) DVT PPX - low molecular weight heparin  11) Diet - currently NPO 2/2 failed bedside swallow eval. Will have speech evaluate.   LOS: 1 day   Jersie Beel R 05/29/2012, 10:21 AM

## 2012-05-30 LAB — GLUCOSE, CAPILLARY
Glucose-Capillary: 216 mg/dL — ABNORMAL HIGH (ref 70–99)
Glucose-Capillary: 401 mg/dL — ABNORMAL HIGH (ref 70–99)
Glucose-Capillary: 336 mg/dL — ABNORMAL HIGH (ref 70–99)

## 2012-05-30 LAB — CBC
Hemoglobin: 13.5 g/dL (ref 12.0–15.0)
MCV: 88.2 fL (ref 78.0–100.0)
RBC: 4.4 MIL/uL (ref 3.87–5.11)

## 2012-05-30 LAB — BASIC METABOLIC PANEL
Calcium: 9.2 mg/dL (ref 8.4–10.5)
Chloride: 99 mEq/L (ref 96–112)
GFR calc Af Amer: >90 mL/min (ref >90)
Potassium: 3.6 mEq/L (ref 3.5–5.1)

## 2012-05-30 LAB — DIFFERENTIAL
Basophils Relative: 0 % (ref 0–1)
Lymphs Abs: 1.7 10*3/uL (ref 0.7–4.0)
Monocytes Relative: 8 % (ref 3–12)
Neutro Abs: 5 10*3/uL (ref 1.7–7.7)
Neutrophils Relative %: 67 % (ref 43–77)

## 2012-05-30 MED ORDER — ENSURE PUDDING PO PUDG
1.0000 | Freq: Three times a day (TID) | ORAL | Status: DC
  Administered 2012-05-30 – 2012-06-03 (×7): 1 via ORAL

## 2012-05-30 MED ORDER — INSULIN GLARGINE 100 UNIT/ML ~~LOC~~ SOLN
10.0000 [IU] | Freq: Every day | SUBCUTANEOUS | Status: DC
  Administered 2012-05-30: 10 [IU] via SUBCUTANEOUS

## 2012-05-30 MED ORDER — LISINOPRIL 10 MG PO TABS
10.0000 mg | ORAL_TABLET | Freq: Every day | ORAL | Status: DC
  Administered 2012-05-30 – 2012-06-03 (×5): 10 mg via ORAL
  Filled 2012-05-30 (×5): qty 1

## 2012-05-30 MED ORDER — CARVEDILOL 12.5 MG PO TABS
12.5000 mg | ORAL_TABLET | Freq: Two times a day (BID) | ORAL | Status: DC
  Administered 2012-05-30 – 2012-05-31 (×2): 12.5 mg via ORAL
  Filled 2012-05-30 (×4): qty 1

## 2012-05-30 MED ORDER — CARVEDILOL 25 MG PO TABS
25.0000 mg | ORAL_TABLET | Freq: Two times a day (BID) | ORAL | Status: DC
Start: 2012-05-30 — End: 2012-05-30
  Filled 2012-05-30 (×3): qty 1

## 2012-05-30 MED ORDER — INSULIN ASPART 100 UNIT/ML ~~LOC~~ SOLN
0.0000 [IU] | Freq: Three times a day (TID) | SUBCUTANEOUS | Status: DC
  Administered 2012-05-30: 5 [IU] via SUBCUTANEOUS
  Administered 2012-05-31: 8 [IU] via SUBCUTANEOUS
  Administered 2012-05-31: 11 [IU] via SUBCUTANEOUS
  Administered 2012-05-31 – 2012-06-01 (×2): 8 [IU] via SUBCUTANEOUS
  Administered 2012-06-01: 11 [IU] via SUBCUTANEOUS
  Administered 2012-06-01 – 2012-06-02 (×2): 8 [IU] via SUBCUTANEOUS
  Administered 2012-06-02: 11 [IU] via SUBCUTANEOUS

## 2012-05-30 NOTE — Progress Notes (Signed)
Subjective: The patient states that she is feeling well today, other than some slight pain/cramping in her abdomen (as she is conveying this information, she is in no obvious discomfort and is eating a meal). She denies fevers, chills, any other pain elsewhere, or any other complaints.  Speaking with the granddaughter, it was mentioned that the family has noticed some improvement in the memory of the patient over the last 24 hours. Most notably, in their opinion, is her increased ability to remember family members' names (although the granddaughter in the room did not believe the patient knew who she was still at this point).  No acute interval events.   Objective: Vital signs in last 24 hours: Filed Vitals:   05/29/12 2015 05/29/12 2210 05/30/12 0140 05/30/12 0551  BP: 162/76 180/78 180/68 176/66  Pulse:  79 76 72  Temp:  98.6 F (37 C) 100.2 F (37.9 C) 98.6 F (37 C)  TempSrc:  Oral Oral Oral  Resp:  18 18 20   Height:      Weight:      SpO2:  96% 97% 97%   Weight change:  No intake or output data in the 24 hours ending 05/30/12 0820  Physical Exam:  General: Fluctuating level of alertness. Unchanged since yesterday.  CV: RRR; normal S1 and S2. 3/6 systolic murmur  Resp: CTA bilaterally; no wheezes, rales, rhonchi  Abd: Soft, non-tender, non-distended; normoactive BS  Ext: 1+ pretibial edema  Neuro: Oriented x 2 (person, time). Partially oriented to place (knows she is in [facility] hospital, but does not know what state).   Lab Results: Basic Metabolic Panel:  Lab 05/29/12 1610 05/28/12 1404  NA 136 136  K 3.2* 3.5  CL 98 99  CO2 26 27  GLUCOSE 199* 234*  BUN 8 11  CREATININE 0.45* 0.54  CALCIUM 8.7 9.2  MG -- --  PHOS -- --   Liver Function Tests:  Lab 05/29/12 0535  AST 14  ALT 10  ALKPHOS 49  BILITOT 0.8  PROT 6.3  ALBUMIN 3.1*   CBC:  Lab 05/28/12 1404  WBC 7.4  NEUTROABS 4.2  HGB 12.9  HCT 37.5  MCV 89.3  PLT 170   Cardiac Enzymes:  Lab 05/29/12  1202 05/29/12 0555 05/28/12 2101  CKTOTAL 33 17 18  CKMB 1.0 0.7 0.7  CKMBINDEX -- -- --  TROPONINI <0.30 <0.30 <0.30   CBG:  Lab 05/30/12 0703 05/29/12 2213 05/29/12 1753 05/29/12 1139 05/29/12 0652 05/28/12 2044  GLUCAP 216* 281* 199* 199* 191* 175*   Fasting Lipid Panel:  Lab 05/29/12 0535  CHOL 116  HDL 33*  LDLCALC 60  TRIG 960  CHOLHDL 3.5  LDLDIRECT --   Thyroid Function Tests:  Lab 05/28/12 2101  TSH 1.260  T4TOTAL --  FREET4 1.32  T3FREE --  THYROIDAB --    Alcohol Level:  Lab 05/28/12 1426  ETH <11   Urinalysis:  Lab 05/28/12 1431  COLORURINE YELLOW  LABSPEC 1.019  PHURINE 7.5  GLUCOSEU 500*  HGBUR NEGATIVE  BILIRUBINUR NEGATIVE  KETONESUR NEGATIVE  PROTEINUR NEGATIVE  UROBILINOGEN 1.0  NITRITE NEGATIVE  LEUKOCYTESUR NEGATIVE    Micro Results: No results found for this or any previous visit (from the past 240 hour(s)). Studies/Results: Dg Chest 2 View  05/28/2012  *RADIOLOGY REPORT*  Clinical Data: Altered mental status.  CHEST - 2 VIEW  Comparison: 04/05/2009  Findings: Mild cardiomegaly.  Low lung volumes without focal opacity, effusion or edema.  No acute bony abnormality.  Mild old compression fracture in the upper lumbar spine or lower thoracic spine, stable.  Mild degenerative changes in the thoracolumbar spine.  IMPRESSION: Cardiomegaly.  Low lung volumes.  No acute findings.  Original Report Authenticated By: Cyndie Chime, M.D.   Ct Head Wo Contrast  05/28/2012  *RADIOLOGY REPORT*  Clinical Data: Altered mental status.  Slurred speech.  Recent discharge for stroke from outside facility.  CT HEAD WITHOUT CONTRAST  Technique:  Contiguous axial images were obtained from the base of the skull through the vertex without contrast.  Comparison: 02/09/2009.  Findings: Slight asymmetric hypodensity right insular cortex and and adjacent white matter are could represent acute, subacute, or chronic ischemia.  No CT signs of proximal vascular  thrombosis. No areas of hemorrhage or mass lesion. Mild atrophy with chronic microvascular ischemic change is suspected.  Calvarium is intact. Clear sinuses.  Chronic mastoid changes on the right.  Slight progression atrophy and white matter change compared with 2010.  IMPRESSION: Slight asymmetric hypodensity right insular cortex and adjacent white matter could be acute, subacute or chronic.  No acute hemorrhage or well-demarcated large vessel cortical infarct is observed.  Original Report Authenticated By: Elsie Stain, M.D.   Mr Brain Wo Contrast  05/29/2012  *RADIOLOGY REPORT*  Clinical Data: Right-sided weakness and severe confusion.  Recent CVA and UTI.  Persistent confusion.  MRI HEAD WITHOUT CONTRAST  Technique:  Multiplanar, multiecho pulse sequences of the brain and surrounding structures were obtained according to standard protocol without intravenous contrast.  Comparison: CT head without contrast 05/28/2012.  Findings: An acute non hemorrhagic infarct is present in the posterior right corona radiata.  There is associated T2 and FLAIR hyperintensity with this lesion.  Mild periventricular and scattered subcortical T2 and FLAIR hyperintensity is present otherwise.  There is no hemorrhage or mass lesion.  The ventricles are proportionate to the degree of atrophy.  No significant extra- axial fluid collection is present.  Flow is present in the major intracranial arteries.  The patient is status post left lens extraction.  The globes orbits are otherwise intact.  The paranasal sinuses are clear. Postoperative changes of the right mastoid are again noted.  IMPRESSION:  1.  Acute/subacute non hemorrhagic infarct within the posterior right corona radiata. 2.  Atrophy and white matter disease otherwise likely reflects the sequelae of chronic microvascular ischemia. 3.  Postoperative changes of the left globe and right mastoid.  Original Report Authenticated By: Jamesetta Orleans. MATTERN, M.D.   Medications:  I have reviewed the patient's current medications. Scheduled Meds:   . aspirin EC  81 mg Oral Daily  . atorvastatin  20 mg Oral q1800  . cloNIDine  0.1 mg Transdermal Weekly  . enoxaparin (LOVENOX) injection  40 mg Subcutaneous Q24H  . hydrALAZINE  2 mg Intravenous Once  . insulin aspart  0-9 Units Subcutaneous TID WC  . levothyroxine  75 mcg Oral Daily  . pneumococcal 23 valent vaccine  0.5 mL Intramuscular Tomorrow-1000  . potassium chloride  40 mEq Oral Q4H  . DISCONTD: aspirin  300 mg Rectal Daily  . DISCONTD: cloNIDine  0.1 mg Oral BID   Continuous Infusions:   . sodium chloride 125 mL/hr at 05/29/12 0515   PRN Meds:.senna-docusate Assessment/Plan: Pt is a 78 y.o. yo female with a PMHx of nonobstructive CAD, uncontrolled DMII (A1c 10.6), hypothyroidism, and recent right frontal stroke in July 2013 who was admitted on 05/28/2012 with symptoms of possible slurred speech and right arm weakness noted by her  PCP on the day of admission. At the time of admission, no neurologic deficits can be appreciated, although the patient was only oriented x1. Cause of this change in mental status is being investigated.   1) Slurred speech and right-sided weakness - the symptoms were noted by the PCP on the day of admission. By the time of evaluation, although the patient is tangential in her thought process, the slurring of her speech is not significant. As well, right arm weakness cannot be appreciated (albeit exam is limited secondary to confusion, limiting cooperation with the exam). Given the history of recent stroke, with prior concern for possible embolic process in the setting of 2 lesions noted on the MRI in [facility], recurrent stroke versus extension of recent stroke is considered, however, would expect neurologic deficits on exam. 2d echo at outside hospital revealed EF 65%, no diatatolic dysfunction (bubble not performed). Carotid doppler at outside hospital negative for flow-limiting  stenosis.  Non-contrast CT head negative for hemorrhage. Brain MRI shows area of infarct in right posterior corona radiata. Will correlate this area of white matter infarct seen on MRI at [facility] with images from OSH. EKG revealed q-waves in inferior leads, and abnormal R-wave progression in precordial leads. Q-waves are stable since EKG in 2010, and R-wave abnormality in V3 likely due to lead placement. CE neg x 2 at present.  - consider TEE if MRI comparison yields evidence of new stroke - Continue to investigate for evidence of hypoglycemia, or infectious causes  2) Altered mental status - unclear etiology, possible prolonged delirium on top of a baseline mild dementia in the setting of her recent acute illness and hospitalization during which she has required restraints. Acute infectious etiology is also considered, however UA and CXR were both negative. Patient has hypothyroidism. On admission, the patient was alert and oriented x1, only to person. Per daughter's description, the patient was independent of ADLs and IADLs prior to recent hospitalizations. However, the patient was known to occasionally forget where she is placed items, and prior hospitalization notes do document a history of dementia.   The patient was living with her husband prior to admission, who she had cared for. It is likely that the patient will not be able to return to this role following discharge, and thus alternative living arrangements will be pursued. Per the family, the alternative that they favor most is that the patient live with the daughter.  -  Consult social work regarding living situation prior to discharge - Avoid restraints if at all possible.  - Continue to monitor her mental status as medications are minimized.  - Family asked to stay at bedside as much as possible to help with orientation.  - Daily CBCs  - blood cultures pending  3) DMII, uncontrolled - A1c 10.6 in 05/2012 - the patient's home regimen is not  clear at this time, she is noted to have recently started insulin therapy during her recent hospitalization. However, discharge summary is not available, and the family does not know her home regimen.  - Will hold oral medications for now.  - Will provide sliding scale insulin only.  - Will request discharge summary, to confirm home regimen   4) Uncontrolled hypertension - Will resume beta-blocker today (coreg 25mg  bid) and other meds sequentially as patient is outside of 24-48 hr window for permissive HTN. It also appears unlikely from MRI at [facility] that she has had an additional stroke, however will correlate with outside imaging.  5) Hypokalemia -  K+ = 3.25mEq/L yesterday. Repleted with PO KCl.  - check BMET this AM  6) Hypothyroidism - on home synthroid. TSH and T4 within normal limits.  7) HLD - LDL of 49 and trig of 128 on 05/2011.  - continue statin  8) Recent right frontal stroke - cause unclear, although noted to have significant ischemic change (and multiple risk factors including uncontrolled HTN, DMII, HLD), was thought to be embolic at last hospitalization given lesions in multiple vascular distributions. However, no clear embolic source was identified.  - Continue risk factor management for now, specifically improved DM and HTN control.  - As mental status clears, will need to involve PT/ OT with consideration for [facility] services, if the patient will live with daughter, of SNF if not.  9) Non-occlusive CAD - no acute CP, shortness of breath, etc.  - Continue ASA, statin, beta blocker  10) DVT PPX - heparin   11) Diet - Pureed currently. Will transition to full diet after dentures are in place.    LOS: 2 days   Rembert Browe R 05/30/2012, 8:20 AM `

## 2012-05-30 NOTE — Progress Notes (Signed)
Patient evaluated for long-term disease management services with [facility] as a benefit of her [employer]. Patient will receive a post discharge transition of care call after discharge and will be evaluated for monthly home visits if needed. Appears the dc plan is home with 24/7 assistance and likely [facility] services. [facility] Care Management services do not interfere with what is arranged by inpatient case management or social work. At bedside to discuss services. Unfortunately, patient sound asleep and unable to awake by voice. Did not want to pursue to wake up patient. Left information at bedside regarding [facility] Care Management with contact information. Will attempt to visit patient at later time.  Raiford Noble, MSN-Ed, RN,BSN [facility] Liaison [phone]

## 2012-05-30 NOTE — Progress Notes (Signed)
INITIAL ADULT NUTRITION ASSESSMENT Date: 05/30/2012   Time: 3:58 PM Reason for Assessment: nutrition risk; dysphagia  ASSESSMENT: Female 78 y.o.  Dx: Right sided weakness  Hx:  Past Medical History  Diagnosis Date  . Diabetes type 2, uncontrolled   . Non-occlusive coronary artery disease     40% first diagonal stenosis, 50% obtuse marginal stenosis  . Hypertension   . Stroke in 05/2012    deep right frontal perventricular deep white matter infarct + subacute left thalamic lacunar infarct  . Hyperlipidemia   . Hypothyroidism   . Osteoporosis   . Hearing loss     history of right sided cholesteotoma s/p Right tympanoplasty with ossicular reconstruction. (03/2002)  . Urinary incontinence     s/p surgeyr for ureterocele   Past Surgical History  Procedure Date  . Abdominal hysterectomy   . Cesarean section   . Bladder repair   . Inner ear surgery   . Tonsillectomy and adenoidectomy     Related Meds:  Scheduled Meds:   . aspirin EC  81 mg Oral Daily  . atorvastatin  20 mg Oral q1800  . carvedilol  12.5 mg Oral BID WC  . cloNIDine  0.1 mg Transdermal Weekly  . enoxaparin (LOVENOX) injection  40 mg Subcutaneous Q24H  . insulin aspart  0-9 Units Subcutaneous TID WC  . insulin glargine  10 Units Subcutaneous QHS  . levothyroxine  75 mcg Oral Daily  . lisinopril  10 mg Oral Daily  . potassium chloride  40 mEq Oral Q4H  . DISCONTD: aspirin  300 mg Rectal Daily  . DISCONTD: carvedilol  25 mg Oral BID WC  . DISCONTD: hydrALAZINE  2 mg Intravenous Once   Continuous Infusions:  PRN Meds:.senna-docusate   Ht: 4\' 8"  (142.2 cm)  Wt: 124 lb 5.4 oz (56.4 kg)  Ideal Wt: 93 lbs % Ideal Wt: 132%  Usual Wt: 56.2 kg (last known wt from 04/2012) % Usual Wt: 100%  Body mass index is 27.88 kg/(m^2).  Food/Nutrition Related Hx: recent admission for CVA, AMS, independent prior to CVA now home with family.  Labs:  CMP     Component Value Date/Time   NA 136 05/30/2012 0829   K  3.6 05/30/2012 0829   CL 99 05/30/2012 0829   CO2 27 05/30/2012 0829   GLUCOSE 275* 05/30/2012 0829   BUN 9 05/30/2012 0829   CREATININE 0.54 05/30/2012 0829   CALCIUM 9.2 05/30/2012 0829   PROT 6.3 05/29/2012 0535   ALBUMIN 3.1* 05/29/2012 0535   AST 14 05/29/2012 0535   ALT 10 05/29/2012 0535   ALKPHOS 49 05/29/2012 0535   BILITOT 0.8 05/29/2012 0535   GFRNONAA 87* 05/30/2012 0829   GFRAA >90 05/30/2012 0829    CBC    Component Value Date/Time   WBC 7.4 05/30/2012 1350   RBC 4.40 05/30/2012 1350   HGB 13.5 05/30/2012 1350   HCT 38.8 05/30/2012 1350   PLT 194 05/30/2012 1350   MCV 88.2 05/30/2012 1350   MCH 30.7 05/30/2012 1350   MCHC 34.8 05/30/2012 1350   RDW 12.8 05/30/2012 1350   LYMPHSABS 1.7 05/30/2012 1350   MONOABS 0.6 05/30/2012 1350   EOSABS 0.1 05/30/2012 1350   BASOSABS 0.0 05/30/2012 1350   Intake: first tray this evening. Output: Last BM (7/16)   Diet Order: Dysphagia 1, thin  Supplements/Tube Feeding: none at this time  Estimated Nutritional Needs:   Kcal: 1280-1400 Protein: 56-67g Fluid: 1.6 L/day  Brief Nutrition Note  Patient  identified on the Nutrition Risk Report for dysphagia.  Pt sleeping soundly at time of visit.  No family at bedside.   Pt seen and assessed by SLP who determine pt appropriate for Dysphagia 1, thin liquids due to difficulty chewing.  No overt signs of aspiration noted by SLP.  Pt was NPO prior to speech eval, ate for SLP evaluation. First tray will be for dinner tonight.  Chart reviewed.  Pt with recent admission for CVA with weakness and change in mobility/independence.  Pt has maintained wt since initial visit in 04/2012.    NUTRITION DIAGNOSIS: -Inadequate oral intake (NI-2.1).  Status: Ongoing  RELATED TO: lethargy, AMS  AS EVIDENCE BY: RN report  MONITORING/EVALUATION(Goals): 1.  Food/Beverage; improvement in intake with resolve of AMS to >50% of meals to meet >/=90% of needs.  EDUCATION NEEDS: -Education not appropriate at this  time  INTERVENTION: 1.  Supplments; Ensure pudding with medications for additional protein.  Dietitian #: [phone]  DOCUMENTATION CODES Per approved criteria  -Not Applicable    Loyce Dys [facility] 05/30/2012, 3:58 PM

## 2012-05-30 NOTE — Clinical Documentation Improvement (Signed)
GENERIC DOCUMENTATION CLARIFICATION QUERY  THIS DOCUMENT IS NOT A PERMANENT PART OF THE MEDICAL RECORD  TO RESPOND TO THE THIS QUERY, FOLLOW THE INSTRUCTIONS BELOW:  1. If needed, update documentation for the patient's encounter via the notes activity.  2. Access this query again and click edit on the In [employer].  3. After updating, or not, click F2 to complete all highlighted (required) fields concerning your review. Select "additional documentation in the medical record" OR "no additional documentation provided".  4. Click Sign note button.  5. The deficiency will fall out of your In Basket *Please let us know if you are not able to complete this workflow by phone or e-mail (listed below).  Please update your documentation within the medical record to reflect your response to this query.                                                                                        05/30/12   Dear Dr. Lavena Bullion / Associates,  In a better effort to capture your patient's severity of illness/SOI, reflect appropriate length of stay and utilization of resources, a review of the patient medical record has revealed the following indicators.  THANK YOU FOR RESPONDING TO THIS QUERY.  Possible Clinical Conditions? - Right hemiparesis - Right hemiparesis improved/resolved - Other Condition (please specify) - Cannot Clinically Determine  Supporting Information: - Risk Factors: Recent CVA possible embolic, possible recurrent CVA vs extended CVA - Signs & Symptoms: Right side weakness - Treatment: PT/OT  You may use possible, probable, or suspect with inpatient documentation. possible, probable, suspected diagnoses MUST be documented at the time of discharge  Reviewed:  no additional documentation provided  Thank You,  Beverley Fiedler RN Clinical Documentation Specialist: Pager: [phone] Health Information Management: [phone] [facility]

## 2012-05-30 NOTE — Progress Notes (Signed)
Internal Medicine Teaching Service Attending Note Date: 05/30/2012  Patient name: Jamie Cochran  Medical record number: [id number]  Date of birth: 05/24/1930    This patient has been seen and discussed with the house staff. Please see their note for complete details. I concur with their findings with the following additions/corrections: Jamie Cochran was sleeping but easy awoke to voice. Family was presented and updated on MRI findings (no new CVA - just the subacute CVA), negative infxn W/U, and dispo plans. Likely will be stable for D/C on Monday 22nd. Family interested in short term SNF / rehab before she goes to her daughters home. We have adjusted her DM regimen and will cont to tweak it until D/C to improve glycemic control. She had bc hypoglycemic on home dose of Lantus 30.   Jamie Cochran 05/30/2012, 2:33 PM

## 2012-05-30 NOTE — Evaluation (Signed)
Occupational Therapy Evaluation Patient Details Name: Jamie Cochran MRN: [id number] DOB: Feb 25, 1930 Today's Date: 05/30/2012 Time: 6578-4696 OT Time Calculation (min): 37 min  OT Assessment / Plan / Recommendation Clinical Impression  Pt admitted with AMS, slurred speech after recent d/c from hospital in [facility] s/p Rt CVA. Pt was at her daughter's home for one day before brought back to hospital for worsening symptoms. Pt presents with ataxia, cognitive deficits and generalized dec in I with ADL and ADL mobility. Will benefit from skilled OT acutely to maximize I with ADL and ADL mobility prior to d/c    OT Assessment  Patient needs continued OT Services    Follow Up Recommendations  Skilled nursing facility (per family request)    Barriers to Discharge      Equipment Recommendations  Defer to next venue    Recommendations for Other Services    Frequency  Min 2X/week    Precautions / Restrictions Precautions Precautions: Fall Precaution Comments: ataxia Restrictions Weight Bearing Restrictions: No   Pertinent Vitals/Pain Pt reports lower abdominal pain, especially with bed mobility. Pt incontinent of bladder x 2 during session    ADL  Grooming: Performed;Wash/dry hands;Minimal assistance Where Assessed - Grooming: Supported standing Upper Body Dressing: Performed;Minimal assistance Where Assessed - Upper Body Dressing: Unsupported sitting Lower Body Dressing: Performed;Minimal assistance Where Assessed - Lower Body Dressing: Unsupported sitting Toilet Transfer: Performed;Minimal assistance Toilet Transfer Method: Sit to stand Toilet Transfer Equipment: Regular height toilet;Grab bars Toileting - Clothing Manipulation and Hygiene: Performed;Min guard Where Assessed - Engineer, mining and Hygiene: Sit on 3-in-1 or toilet Equipment Used: Gait belt Transfers/Ambulation Related to ADLs: +2 total A(pt=70%) for RW ambulation- 2nd person needed for safety ADL  Comments: Pt with ataxia LUE >LLE    OT Diagnosis: Generalized weakness;Acute pain  OT Problem List: Decreased strength;Decreased activity tolerance;Impaired balance (sitting and/or standing);Decreased safety awareness;Decreased knowledge of use of DME or AE;Pain;Impaired UE functional use OT Treatment Interventions: Self-care/ADL training;DME and/or AE instruction;Therapeutic activities;Cognitive remediation/compensation;Patient/family education;Balance training   OT Goals Acute Rehab OT Goals OT Goal Formulation: With patient Time For Goal Achievement: 06/13/12 Potential to Achieve Goals: Fair ADL Goals Pt Will Perform Grooming:  (Min guard A standing at sink) Pt Will Perform Lower Body Dressing: with min assist;Sit to stand from chair;Sit to stand from bed ADL Goal: Lower Body Dressing - Progress: Goal set today Pt Will Transfer to Toilet: with min assist;Ambulation;Regular height toilet ADL Goal: Toilet Transfer - Progress: Goal set today Pt Will Perform Toileting - Clothing Manipulation: with supervision;Standing ADL Goal: Toileting - Clothing Manipulation - Progress: Goal set today Pt Will Perform Toileting - Hygiene: with supervision;Sitting on 3-in-1 or toilet ADL Goal: Toileting - Hygiene - Progress: Goal set today  Visit Information  Last OT Received On: 05/30/12 Assistance Needed: +2 (for safety) PT/OT Co-Evaluation/Treatment: Yes    Subjective Data  Subjective: I need to pee!! Patient Stated Goal: Return home   Prior Functioning  Vision/Perception  Home Living Lives With: Spouse;Other (Comment) (plan is to stay with dtr) Available Help at Discharge: Family;Available 24 hours/day (per granddaughter) Type of Home: House Home Access: Stairs to enter [employer] of Steps: 5 Entrance Stairs-Rails: None Home Layout: Able to live on main level with bedroom/bathroom Prior Function Able to Take Stairs?: Yes Comments: Per granddaughter, pt was I and driving  prior to CVA (which took place prior to this admit) Communication Communication: HOH Dominant Hand: Right   Vision - Assessment Additional Comments: unable to test  Cognition  Overall Cognitive Status: Impaired Area of Impairment: Memory;Attention;Safety/judgement;Following commands;Awareness of deficits Arousal/Alertness: Awake/alert Orientation Level: Disoriented X4;Time;Situation Behavior During Session: [facility] for tasks performed Current Attention Level: Sustained Attention - Other Comments: Patient requires cueing to stay focused on task Memory Deficits: granddaughter present and able to correct wrong info given.  Following Commands: Follows one step commands with increased time Safety/Judgement: Decreased safety judgement for tasks assessed;Impulsive    Extremity/Trunk Assessment Right Upper Extremity Assessment RUE ROM/Strength/Tone: [facility] for tasks assessed Left Upper Extremity Assessment LUE ROM/Strength/Tone: Deficits LUE ROM/Strength/Tone Deficits: difficult to test secondary to cognition, but appears grossly weak (4/5) LUE Sensation: WFL - Light Touch LUE Coordination: Deficits LUE Coordination Deficits: ataxic movements at rest and during activity; athetoid in nature   Mobility     Exercise    Balance Dynamic Sitting Balance Dynamic Sitting - Balance Support: No upper extremity supported;During functional activity;Feet supported Dynamic Sitting - Level of Assistance: 4: Min assist Dynamic Sitting - Balance Activities: Lateral lean/weight shifting;Forward lean/weight shifting;Reaching across midline  End of Session OT - End of Session Equipment Utilized During Treatment: Gait belt Activity Tolerance: Patient tolerated treatment well Patient left: in bed;with call bell/phone within reach;with family/visitor present Nurse Communication: Mobility status  GO     Renesha Lizama 05/30/2012, 3:30 PM

## 2012-05-30 NOTE — Progress Notes (Addendum)
Inpatient Diabetes Program Recommendations  AACE/ADA: New Consensus Statement on Inpatient Glycemic Control  Target Ranges:  Prepandial:   less than 140 mg/dL      Peak postprandial:   less than 180 mg/dL (1-2 hours)      Critically ill patients:  140 - 180 mg/dL  Pager:  [phone] Hours:  8 am-10pm   Reason for Visit: Elevated glucose:  216, 401 mg/dL  Inpatient Diabetes Program Recommendations Insulin - Basal: Resume Home Lantus 30 units qhs  Note:   May also consider adding Novolog 4 units TID with meals.   Alfredia Client PhD, RN Diabetes Coordinator  Office:  [phone] Team Pager:  [phone]

## 2012-05-30 NOTE — Progress Notes (Signed)
Speech Language Pathology Dysphagia Treatment Patient Details Name: Jamie Cochran MRN: [id number] DOB: 1930-08-12 Today's Date: 05/30/2012 Time: 5284-1324 SLP Time Calculation (min): 22 min  Assessment / Plan / Recommendation Clinical Impression  Pt with no overt s/s of aspiration. Dentures are still not present. Recommend continue current diet, no signs of dysphagia. SLP will f/u for swallow x1 to check with solid POs if dentures arrive. Will continue to treat 2x a week for cognition.     Diet Recommendation  Continue with Current Diet: Thin liquid;Dysphagia 1 (puree)    SLP Plan Continue with current plan of care   Pertinent Vitals/Pain NA   Swallowing Goals  SLP Swallowing Goals Patient will consume recommended diet without observed clinical signs of aspiration with: Modified independent assistance Swallow Study Goal #1 - Progress: Progressing toward goal  General Temperature Spikes Noted: No Respiratory Status: Room air Behavior/Cognition: Alert;Confused Oral Cavity - Dentition: Edentulous Patient Positioning: Upright in bed  Oral Cavity - Oral Hygiene Does patient have any of the following "at risk" factors?: None of the above Brush patient's teeth BID with toothbrush (using toothpaste with fluoride): Yes   Dysphagia Treatment Treatment focused on: Skilled observation of diet tolerance Treatment Methods/Modalities: Skilled observation Patient observed directly with PO's: Yes Type of PO's observed: Dysphagia 1 (puree);Thin liquids Feeding: Needs assist Liquids provided via: Cup;Straw   GO     Jerrine Urschel, Riley Nearing 05/30/2012, 10:18 AM  Speech Language Pathology Treatment Patient Details Name: Jamie Cochran MRN: [id number] DOB: [birth year] Today's Date: 05/30/2012 Time: 6644-0347 SLP Time Calculation (min): 22 min  Assessment / Plan / Recommendation Clinical Impression  Pt attended to basic functional task for 15 minutes with min contextual  and verbal cues. Pt demonstrates basic funtional problem solving with familiar task (PO intake), required max verbal cues and model for problem solving with unfamilar but basic task (use of call bell). SLp provided max verbal cues for intellectual awareness of deficit. Will contiue to follow.     SLP Plan  Continue with current plan of care    Pertinent Vitals/Pain NA  SLP Goals  SLP Goals Potential to Achieve Goals: Good SLP Goal #1: Patient will sustain attention for 15-20 minutes to complete basic ADL task with minimal verbal cues. SLP Goal #1 - Progress: Progressing toward goal SLP Goal #2: Patient will solve basic problems in functional ADL tasks with minimal contextual cues. SLP Goal #2 - Progress: Progressing toward goal SLP Goal #3: Patient will follow basic 2 step directions in ADL tasks with minimal verbal cues. SLP Goal #3 - Progress: Progressing toward goal  General Temperature Spikes Noted: No Respiratory Status: Room air Behavior/Cognition: Alert;Confused Oral Cavity - Dentition: Edentulous Patient Positioning: Upright in bed  Oral Cavity - Oral Hygiene Does patient have any of the following "at risk" factors?: None of the above Brush patient's teeth BID with toothbrush (using toothpaste with fluoride): Yes   Treatment Treatment focused on: Cognition Skilled Treatment: min verbal cues, contextual cues, cues for awareness, reasoning, attention, basic functional problem solving.    GO     Simrin Vegh, Riley Nearing 05/30/2012, 10:22 AM

## 2012-05-30 NOTE — Progress Notes (Signed)
Physical Therapy Treatment Patient Details Name: Jamie Cochran MRN: [id number] DOB: 1930/11/01 Today's Date: 05/30/2012 Time: 7829-5621 PT Time Calculation (min): 29 min  PT Assessment / Plan / Recommendation Comments on Treatment Session  Patient with previous CVA and worsening symtoms. Patient incontinent x2 during session. Patient unaware. Patient able to increase ambulation today and work on higher level balance activities. Will need to confirm who will be at home with patient at discharge.     Follow Up Recommendations  Home health PT;Supervision/Assistance - 24 hour    Barriers to Discharge        Equipment Recommendations  None recommended by PT    Recommendations for Other Services    Frequency Min 4X/week   Plan Discharge plan remains appropriate;Frequency remains appropriate    Precautions / Restrictions Precautions Precautions: Fall Restrictions Weight Bearing Restrictions: No   Pertinent Vitals/Pain     Mobility  Bed Mobility Left Sidelying to Sit: 4: Min assist;With rails;HOB elevated Sitting - Scoot to Edge of Bed: 4: Min guard Sit to Supine: 4: Min assist;With rail Transfers Sit to Stand: 4: Min assist;With upper extremity assist;From bed;From chair/3-in-1;From toilet;With armrests Stand to Sit: 4: Min assist;To bed;To chair/3-in-1;To toilet;With upper extremity assist;With armrests Details for Transfer Assistance:  Patient stood several times. Incontinent with first stand. Patient required cues for safety and A to ensure balance.  Ambulation/Gait Ambulation/Gait Assistance: 1: +2 Total assist Ambulation/Gait: Patient Percentage: 70% Ambulation Distance (Feet): 80 Feet Assistive device: 2 person hand held assist Ambulation/Gait Assistance Details: A for balance. Patient with ataxic gait requring cues and control for posture and balance. Patient with heavy Bilateral sway. If left hand is left free without HHA assist it is very ataxic Gait Pattern:  Wide base of support;Ataxic;Lateral trunk lean to right;Lateral trunk lean to left    Exercises     PT Diagnosis:    PT Problem List:   PT Treatment Interventions:     PT Goals Acute Rehab PT Goals PT Goal: Rolling Supine to Right Side - Progress: Progressing toward goal PT Goal: Rolling Supine to Left Side - Progress: Progressing toward goal PT Goal: Supine/Side to Sit - Progress: Progressing toward goal PT Goal: Sit at Edge Of Bed - Progress: Progressing toward goal PT Goal: Sit to Supine/Side - Progress: Progressing toward goal PT Transfer Goal: Bed to Chair/Chair to Bed - Progress: Progressing toward goal PT Goal: Ambulate - Progress: Progressing toward goal  Visit Information  Last PT Received On: 05/30/12 Assistance Needed: +1    Subjective Data  Subjective: "This battery is weighing me down" referring to tele box   Cognition  Overall Cognitive Status: Impaired Area of Impairment: Memory;Attention;Safety/judgement;Following commands;Awareness of deficits Arousal/Alertness: Awake/alert Orientation Level: Disoriented X4;Time;Situation Behavior During Session: [facility] for tasks performed Current Attention Level: Sustained Attention - Other Comments: Patient requires cueing to stay focused on task Memory Deficits: granddaughter present and able to correct wrong info given.  Following Commands: Follows one step commands with increased time Safety/Judgement: Decreased safety judgement for tasks assessed;Impulsive    Balance  Dynamic Sitting Balance Dynamic Sitting - Balance Support: No upper extremity supported;During functional activity;Feet supported Dynamic Sitting - Level of Assistance: 4: Min assist Dynamic Sitting - Balance Activities: Lateral lean/weight shifting;Forward lean/weight shifting;Reaching across midline Dynamic Sitting - Comments: A to control balance laterally. Cues for positioning Static Standing Balance Static Standing - Balance Support: Left upper  extremity supported;Right upper extremity supported;During functional activity Static Standing - Level of Assistance: 4: Min assist  End  of Session PT - End of Session Equipment Utilized During Treatment: Gait belt Activity Tolerance: Patient tolerated treatment well Patient left: in bed;with call bell/phone within reach Nurse Communication: Mobility status   GP     Fredrich Birks 05/30/2012, 10:09 AM 05/30/2012 Fredrich Birks PTA [phone] pager [phone] office

## 2012-05-31 ENCOUNTER — Encounter (HOSPITAL_COMMUNITY): Payer: Self-pay | Admitting: Internal Medicine

## 2012-05-31 DIAGNOSIS — R4182 Altered mental status, unspecified: Secondary | ICD-10-CM

## 2012-05-31 LAB — BASIC METABOLIC PANEL
CO2: 26 mEq/L (ref 19–32)
Chloride: 99 mEq/L (ref 96–112)
GFR calc non Af Amer: 89 mL/min — ABNORMAL LOW (ref >90)
Glucose, Bld: 295 mg/dL — ABNORMAL HIGH (ref 70–99)
Potassium: 3.7 mEq/L (ref 3.5–5.1)
Sodium: 136 mEq/L (ref 135–145)

## 2012-05-31 LAB — GLUCOSE, CAPILLARY
Glucose-Capillary: 280 mg/dL — ABNORMAL HIGH (ref 70–99)
Glucose-Capillary: 323 mg/dL — ABNORMAL HIGH (ref 70–99)
Glucose-Capillary: 334 mg/dL — ABNORMAL HIGH (ref 70–99)

## 2012-05-31 MED ORDER — INSULIN GLARGINE 100 UNIT/ML ~~LOC~~ SOLN
14.0000 [IU] | Freq: Every day | SUBCUTANEOUS | Status: DC
  Administered 2012-05-31: 14 [IU] via SUBCUTANEOUS

## 2012-05-31 MED ORDER — POTASSIUM CHLORIDE CRYS ER 20 MEQ PO TBCR
40.0000 meq | EXTENDED_RELEASE_TABLET | Freq: Once | ORAL | Status: AC
  Administered 2012-05-31: 40 meq via ORAL
  Filled 2012-05-31: qty 2

## 2012-05-31 MED ORDER — CARVEDILOL 25 MG PO TABS
25.0000 mg | ORAL_TABLET | Freq: Two times a day (BID) | ORAL | Status: DC
  Administered 2012-05-31 – 2012-06-03 (×6): 25 mg via ORAL
  Filled 2012-05-31 (×9): qty 1

## 2012-05-31 NOTE — Progress Notes (Signed)
Pt found sitting in floor next to the bed by the NT with the bed alarm going off. The Charge Nurse Philomena, and the NT got the Pt. Back to bed checked the VS and notified the MD.  I can in and the Pt was back in bed with the bed alarm on setting more sensitive  and 3 side rails up. I assessed the Pt. She was unable to tell me what had happened, she remembered getting up but that was it, she does not remember falling or sitting in the floor.  She denies any pain and has no noted injuries.  While we (the CN, NT and I where in the room with the Pt Doctor [facility] come to see the Patient. New order for 4 side rails.  Attempted to call Pt. Husband with no answer or answering machine. Will cont to monitor.

## 2012-05-31 NOTE — Progress Notes (Deleted)
Physical Therapy Treatment Patient Details Name: Jamie Cochran MRN: [id number] DOB: 05-25-1930 Today's Date: 05/31/2012 Time: 2725-3664 PT Time Calculation (min): 22 min  PT Assessment / Plan / Recommendation Comments on Treatment Session  Pt is pleasant and willing to participate in therapy.  Pt was incontinent upon standing (pt aware).  Pt able to increase ambulation distance.  Pt requires HHA to walk safely due to ataxia.  Pt would benefit from  PT in order to work on higher level balance and safety during functional activities.  Continue per POC.    Follow Up Recommendations  Home health PT;Supervision/Assistance - 24 hour    Barriers to Discharge        Equipment Recommendations  Defer to next venue    Recommendations for Other Services    Frequency Min 4X/week   Plan Discharge plan remains appropriate;Frequency remains appropriate    Precautions / Restrictions Precautions Precautions: Fall Precaution Comments: ataxia Restrictions Weight Bearing Restrictions: No   Pertinent Vitals/Pain     Mobility  Bed Mobility Bed Mobility: Supine to Sit;Sit to Supine Supine to Sit: 4: Min assist;With rails;HOB elevated Sitting - Scoot to Edge of Bed: 4: Min guard Sit to Supine: 4: Min assist;With rail;HOB elevated Details for Bed Mobility Assistance: Pt required VC for correct form and to slow down and A to lift trunk to sitting and lift LE all the way into bed.  Transfers Sit to Stand: 4: Min assist;From bed;From toilet;With armrests Stand to Sit: 4: Min assist;With upper extremity assist;To bed;To toilet Details for Transfer Assistance: Pt required A to balance due to ataxia in L UE/LE.   Ambulation/Gait Ambulation/Gait Assistance: 1: +2 Total assist Ambulation/Gait: Patient Percentage: 70% Ambulation Distance (Feet): 150 Feet Assistive device: 2 person hand held assist Ambulation/Gait Assistance Details: + 2 A for balance due to ataxic gait, swaying and control of  posture.  Gait Pattern: Ataxic;Wide base of support;Lateral trunk lean to right;Lateral trunk lean to left Stairs: No Modified Rankin (Stroke Patients Only) Modified Rankin: Moderately severe disability    Exercises     PT Diagnosis:    PT Problem List:   PT Treatment Interventions:     PT Goals Acute Rehab PT Goals PT Goal: Rolling Supine to Right Side - Progress: Progressing toward goal PT Goal: Rolling Supine to Left Side - Progress: Progressing toward goal PT Goal: Supine/Side to Sit - Progress: Progressing toward goal PT Goal: Sit at Edge Of Bed - Progress: Progressing toward goal PT Goal: Sit to Supine/Side - Progress: Progressing toward goal PT Goal: Ambulate - Progress: Progressing toward goal  Visit Information  Last PT Received On: 05/31/12 Assistance Needed: +2 (for safety/balance)    Subjective Data  Subjective: "My back hurts."   Cognition  Overall Cognitive Status: Impaired Area of Impairment: Attention;Memory;Following commands;Safety/judgement;Awareness of errors;Awareness of deficits Arousal/Alertness: Awake/alert Behavior During Session: [facility] for tasks performed    Balance  Static Sitting Balance Static Sitting - Balance Support: Right upper extremity supported;Feet supported Static Sitting - Level of Assistance: 4: Min assist Static Sitting - Comment/# of Minutes: Pt with ataxic movements/jerking Static Standing Balance Static Standing - Balance Support: During functional activity;Right upper extremity supported;Left upper extremity supported Static Standing - Level of Assistance: 4: Min assist  End of Session PT - End of Session Equipment Utilized During Treatment: Gait belt Activity Tolerance: Patient tolerated treatment well Patient left: in bed;with call bell/phone within reach;with bed alarm set;with restraints reapplied (4 rails up) Nurse Communication: Mobility status   GP  Dorella Laster, SPTA 05/31/2012, 11:56 AM

## 2012-05-31 NOTE — Progress Notes (Addendum)
Clinical Social Work Department CLINICAL SOCIAL WORK PLACEMENT NOTE 05/31/2012  Patient:  Jamie Cochran, Jamie Cochran  Account Number:  [id number] Admit date:  05/28/2012  Clinical Social Worker:  Bonnye Fava, LCSW  Date/time:  05/31/2012 11:40 AM  Clinical Social Work is seeking post-discharge placement for this patient at the following level of care:   SKILLED NURSING   (*CSW will update this form in Epic as items are completed)   05/31/2012  Patient/family provided with [facility] Department of Clinical Social Work's list of facilities offering this level of care within the geographic area requested by the patient (or if unable, by the patient's family).  05/31/2012  Patient/family informed of their freedom to choose among providers that offer the needed level of care, that participate in Medicare, Medicaid or managed care program needed by the patient, have an available bed and are willing to accept the patient.  05/31/2012  Patient/family informed of [facility]' ownership interest in [facility], as well as of the fact that they are under no obligation to receive care at this facility.  PASARR submitted to EDS on 05/31/2012 PASARR number received from EDS on   FL2 transmitted to all facilities in geographic area requested by pt/family on  05/31/2012 FL2 transmitted to all facilities within larger geographic area on   Patient informed that his/her managed care company has contracts with or will negotiate with  certain facilities, including the following:     Patient/family informed of bed offers received:   Patient chooses bed at [facility]  (DTC) Physician recommends and patient chooses bed at    Patient to be transferred to [facility] on  06/03/2012  (DTC) Patient to be transferred to facility by ambulance  (PTAR)  (DTC)  The following physician request were entered in Epic:   Additional Comments: CSW sumbitted all paperwork to partners/blue medicare-pt  husband preference is [facility]  [place], [phone]  DC completed; family aware of d/c plan and are agreeable.  DC to SNF today.   Lorri Frederick. [place]  [phone]

## 2012-05-31 NOTE — Progress Notes (Signed)
Physical Therapy Treatment Patient Details Name: Jamie Cochran MRN: [id number] DOB: [birth year] Today's Date: 05/31/2012 Time: 4098-1191 PT Time Calculation (min): 22 min  PT Assessment / Plan / Recommendation Comments on Treatment Session  Pt is pleasant and willing to participate in therapy.  Pt was incontinent upon standing (pt aware).  Pt able to increase ambulation distance.  Pt requires HHA to walk safely due to ataxia.  Pt would benefit from  PT in order to work on higher level balance and safety during functional activities.  Continue per POC.    Follow Up Recommendations  Supervision/Assistance - 24 hour;Skilled nursing facility    Barriers to Discharge        Equipment Recommendations  Defer to next venue    Recommendations for Other Services    Frequency Min 4X/week   Plan Frequency remains appropriate;Discharge plan needs to be updated    Precautions / Restrictions Precautions Precautions: Fall Precaution Comments: ataxia Restrictions Weight Bearing Restrictions: No   Pertinent Vitals/Pain     Mobility  Bed Mobility Bed Mobility: Supine to Sit;Sit to Supine Supine to Sit: 4: Min assist;With rails;HOB elevated Sitting - Scoot to Edge of Bed: 4: Min guard Sit to Supine: 4: Min assist;With rail;HOB elevated Details for Bed Mobility Assistance: Pt required VC for correct form and to slow down and A to lift trunk to sitting and lift LE all the way into bed.  Transfers Sit to Stand: 4: Min assist;From bed;From toilet;With armrests Stand to Sit: 4: Min assist;With upper extremity assist;To bed;To toilet Details for Transfer Assistance: Pt required A to balance due to ataxia in L UE/LE.   Ambulation/Gait Ambulation/Gait Assistance: 1: +2 Total assist Ambulation/Gait: Patient Percentage: 70% Ambulation Distance (Feet): 150 Feet Assistive device: 2 person hand held assist Ambulation/Gait Assistance Details: + 2 A for balance due to ataxic gait, swaying and  control of posture.  Gait Pattern: Ataxic;Wide base of support;Lateral trunk lean to right;Lateral trunk lean to left Stairs: No Modified Rankin (Stroke Patients Only) Modified Rankin: Moderately severe disability    Exercises     PT Diagnosis:    PT Problem List:   PT Treatment Interventions:     PT Goals Acute Rehab PT Goals PT Goal: Rolling Supine to Right Side - Progress: Progressing toward goal PT Goal: Rolling Supine to Left Side - Progress: Progressing toward goal PT Goal: Supine/Side to Sit - Progress: Progressing toward goal PT Goal: Sit at Edge Of Bed - Progress: Progressing toward goal PT Goal: Sit to Supine/Side - Progress: Progressing toward goal PT Goal: Ambulate - Progress: Progressing toward goal  Visit Information  Last PT Received On: 05/31/12 Assistance Needed: +2 (for safety/balance)    Subjective Data  Subjective: "My back hurts."   Cognition  Overall Cognitive Status: Impaired Area of Impairment: Attention;Memory;Following commands;Safety/judgement;Awareness of errors;Awareness of deficits Arousal/Alertness: Awake/alert Behavior During Session: [facility] for tasks performed    Balance  Static Sitting Balance Static Sitting - Balance Support: Right upper extremity supported;Feet supported Static Sitting - Level of Assistance: 4: Min assist Static Sitting - Comment/# of Minutes: Pt with ataxic movements/jerking Static Standing Balance Static Standing - Balance Support: During functional activity;Right upper extremity supported;Left upper extremity supported Static Standing - Level of Assistance: 4: Min assist  End of Session PT - End of Session Equipment Utilized During Treatment: Gait belt Activity Tolerance: Patient tolerated treatment well Patient left: in bed;with call bell/phone within reach;with bed alarm set;with restraints reapplied (4 rails up) Nurse Communication: Mobility status  GP     Jamie Cochran, SPTA 05/31/2012, 1:33 PM Mckinley Jewel, PTA

## 2012-05-31 NOTE — Progress Notes (Signed)
Agree with updated discharge plan 05/31/2012 Milana Kidney DPT PAGER: [phone] OFFICE: [phone]

## 2012-05-31 NOTE — Progress Notes (Signed)
Was called and informed that patient had an unwitnessed fall out of bed at 0100. Pts vitals were stable. Went to evaluate pt. Upon arrival pt was back in bed. She states that she was getting up but then doesn't remember the events. She states she didn't hit her head and is not in any new pain. She denied dizziness, LOC, CP, hip pain, neck pain, HA, SOB, or any other localized pain above her baseline. Per nursing staff they state that her bed alarm was set for if/when the pt would stand up but pt had moved to quickly for assistance and they found her next to the bed. Where able to help her easily back in bed and pt was conscious and alert.  Filed Vitals:   05/31/12 0112  BP: 176/70  Pulse: 74  Temp: 98.6 F (37 C)  Resp: 20   PE: pt is oriented to self and place but not time (her baseline), able to follow commands and answer questions appropriately, PERRL, EOMI, no neck tenderness, no scalp tenderness, head atraumatic no visible ecchymosis, FROM of UE and LE, pt had no hip pain on deep palpation and no visible wounds or ecchymosis on hips or other areas of skin.   Pt is not on anticoagulation, no visible signs of trauma sustained after the fall and therefore felt it not necessary to CT scan at this time. Did order for all rails on bed for pt safety and prevention of falls at this time and nursing had increased sensitivity of bed alarm. Informed nursing staff that if any changes in pts vitals, mental status, or symptoms changed to no hesitate to call.   Thank you, Christen Bame, MD PGY-1 [phone]

## 2012-05-31 NOTE — Progress Notes (Signed)
Subjective: The patient states she is feeling well today, and has no complaints. She denies fever, chills, or any pain. Denies any pain in her hips.  The patient was found sitting on the floor beside her bed yesterday evening. It is not clear if she fell. She had no complaints at that time or at present, and does not recall experiencing a fall last night. She denies headache. Four siderails will be raised for the time being. No other interval events.  Objective: Vital signs in last 24 hours: Filed Vitals:   05/30/12 2142 05/31/12 0112 05/31/12 0209 05/31/12 0607  BP: 168/82 176/70 159/56 185/64  Pulse: 80 74 72 71  Temp: 98.6 F (37 C) 98.6 F (37 C) 98.7 F (37.1 C) 98.1 F (36.7 C)  TempSrc: Oral Oral Oral Oral  Resp: 18 20 20 18   Height:      Weight:      SpO2: 93% 96% 98% 92%   Weight change:  No intake or output data in the 24 hours ending 05/31/12 1140  Physical Exam:  General: Consistently alert and awake. Much improved since previous exam. CV: RRR; normal S1 and S2. 3/6 systolic murmur  Resp: CTA bilaterally; no wheezes, rales, rhonchi  Abd: Soft, non-tender, non-distended; normoactive BS  Ext: no pretibial edema Neuro: Oriented x 2. Much improved since previous exam, although still having difficulties with time (e.g. she believes she has been in the hospital for 6 months). Psych: Appropriate.  Lab Results: Basic Metabolic Panel:  Lab 05/31/12 9147 05/30/12 0829  NA 136 136  K 3.7 3.6  CL 99 99  CO2 26 27  GLUCOSE 295* 275*  BUN 15 9  CREATININE 0.52 0.54  CALCIUM 9.4 9.2  MG -- --  PHOS -- --   Liver Function Tests:  Lab 05/29/12 0535  AST 14  ALT 10  ALKPHOS 49  BILITOT 0.8  PROT 6.3  ALBUMIN 3.1*   CBC:  Lab 05/30/12 1350 05/28/12 1404  WBC 7.4 7.4  NEUTROABS 5.0 4.2  HGB 13.5 12.9  HCT 38.8 37.5  MCV 88.2 89.3  PLT 194 170   Cardiac Enzymes:  Lab 05/29/12 1202 05/29/12 0555 05/28/12 2101  CKTOTAL 33 17 18  CKMB 1.0 0.7 0.7    CKMBINDEX -- -- --  TROPONINI <0.30 <0.30 <0.30   CBG:  Lab 05/31/12 1135 05/31/12 0652 05/30/12 2144 05/30/12 1633 05/30/12 1213 05/30/12 0703  GLUCAP 323* 280* 336* 217* 401* 216*   Fasting Lipid Panel:  Lab 05/29/12 0535  CHOL 116  HDL 33*  LDLCALC 60  TRIG 829  CHOLHDL 3.5  LDLDIRECT --   Thyroid Function Tests:  Lab 05/28/12 2101  TSH 1.260  T4TOTAL --  FREET4 1.32  T3FREE --  THYROIDAB --    Alcohol Level:  Lab 05/28/12 1426  ETH <11   Urinalysis:  Lab 05/28/12 1431  COLORURINE YELLOW  LABSPEC 1.019  PHURINE 7.5  GLUCOSEU 500*  HGBUR NEGATIVE  BILIRUBINUR NEGATIVE  KETONESUR NEGATIVE  PROTEINUR NEGATIVE  UROBILINOGEN 1.0  NITRITE NEGATIVE  LEUKOCYTESUR NEGATIVE   Micro Results: Recent Results (from the past 240 hour(s))  CULTURE, BLOOD (ROUTINE X 2)     Status: Normal (Preliminary result)   Collection Time   05/29/12  5:54 PM      Component Value Range Status Comment   Specimen Description BLOOD LEFT HAND   Final    Special Requests BOTTLES DRAWN AEROBIC ONLY 10CC   Final    Culture  Setup Time  05/29/2012 23:51   Final    Culture     Final    Value:        BLOOD CULTURE RECEIVED NO GROWTH TO DATE CULTURE WILL BE HELD FOR 5 DAYS BEFORE ISSUING A FINAL NEGATIVE REPORT   Report Status PENDING   Incomplete   CULTURE, BLOOD (ROUTINE X 2)     Status: Normal (Preliminary result)   Collection Time   05/29/12  5:54 PM      Component Value Range Status Comment   Specimen Description BLOOD RIGHT ARM   Final    Special Requests BOTTLES DRAWN AEROBIC AND ANAEROBIC 10CC   Final    Culture  Setup Time 05/29/2012 23:51   Final    Culture     Final    Value:        BLOOD CULTURE RECEIVED NO GROWTH TO DATE CULTURE WILL BE HELD FOR 5 DAYS BEFORE ISSUING A FINAL NEGATIVE REPORT   Report Status PENDING   Incomplete    Studies/Results: Mr Brain Wo Contrast  05/29/2012  *RADIOLOGY REPORT*  Clinical Data: Right-sided weakness and severe confusion.  Recent CVA and  UTI.  Persistent confusion.  MRI HEAD WITHOUT CONTRAST  Technique:  Multiplanar, multiecho pulse sequences of the brain and surrounding structures were obtained according to standard protocol without intravenous contrast.  Comparison: CT head without contrast 05/28/2012.  Findings: An acute non hemorrhagic infarct is present in the posterior right corona radiata.  There is associated T2 and FLAIR hyperintensity with this lesion.  Mild periventricular and scattered subcortical T2 and FLAIR hyperintensity is present otherwise.  There is no hemorrhage or mass lesion.  The ventricles are proportionate to the degree of atrophy.  No significant extra- axial fluid collection is present.  Flow is present in the major intracranial arteries.  The patient is status post left lens extraction.  The globes orbits are otherwise intact.  The paranasal sinuses are clear. Postoperative changes of the right mastoid are again noted.  IMPRESSION:  1.  Acute/subacute non hemorrhagic infarct within the posterior right corona radiata. 2.  Atrophy and white matter disease otherwise likely reflects the sequelae of chronic microvascular ischemia. 3.  Postoperative changes of the left globe and right mastoid.  Original Report Authenticated By: Jamesetta Orleans. MATTERN, M.D.   Medications: I have reviewed the patient's current medications. Scheduled Meds:   . aspirin EC  81 mg Oral Daily  . atorvastatin  20 mg Oral q1800  . carvedilol  12.5 mg Oral BID WC  . cloNIDine  0.1 mg Transdermal Weekly  . enoxaparin (LOVENOX) injection  40 mg Subcutaneous Q24H  . feeding supplement  1 Container Oral TID BM  . insulin aspart  0-15 Units Subcutaneous TID WC  . insulin glargine  14 Units Subcutaneous QHS  . levothyroxine  75 mcg Oral Daily  . lisinopril  10 mg Oral Daily  . DISCONTD: insulin aspart  0-9 Units Subcutaneous TID WC  . DISCONTD: insulin glargine  10 Units Subcutaneous QHS   Continuous Infusions:  PRN  Meds:.senna-docusate  Assessment/Plan: Pt is a 78 y.o. yo female with a PMHx of nonobstructive CAD, uncontrolled DMII (A1c 10.6), hypothyroidism, and recent right frontal stroke in July 2013 who was admitted on 05/28/2012 with symptoms of possible slurred speech and right arm weakness noted by her PCP on the day of admission. At the time of admission, no neurologic deficits can be appreciated, although the patient was only oriented x1. Cause of this change in mental  status is being investigated.  1) Slurred speech and right-sided weakness - the symptoms were noted by the PCP on the day of admission. Symptoms were resolved on exam at time of admission. Carotid doppler at outside hospital negative for flow-limiting stenosis. Non-contrast CT head negative for hemorrhage. Brain MRI shows area of infarct in right posterior corona radiata. EKG revealed q-waves in inferior leads, and abnormal R-wave progression in precordial leads. Q-waves are stable since EKG in 2010, and R-wave abnormality in V3 likely due to lead placement. CE neg x 3. - Continue to monitor for evidence of hypoglycemia or infection.  2) Altered mental status - unclear etiology, possible prolonged delirium on top of a baseline mild dementia in the setting of her recent acute illness and hospitalization during which she has required restraints. Acute infectious etiology is also considered, however UA and CXR were both negative. Blood cultures NGTD. Patient has hypothyroidism. On admission, the patient was alert and oriented x1, only to person. Per daughter's description, the patient was independent of ADLs and IADLs prior to recent hospitalizations. However, the patient was known to occasionally forget where she is placed items, and prior hospitalization notes do document a history of dementia.  The patient was living with her husband prior to admission, who she had cared for. It is likely that the patient will not be able to return to this role  following discharge, and thus alternative living arrangements will be pursued. Per the family, the alternative that they favor most is that the patient live with the daughter. The patient will benefit from short-term SNF placement for rehab--social work currently working on placement. - Avoid restraints if at all possible.  - Family asked to stay at bedside as much as possible to help with orientation.   3) DMII, uncontrolled - A1c 10.6 in 05/2012 - the patient's home regimen is not clear at this time, she is noted to have recently started insulin therapy during her recent hospitalization. Awaiting discharge summary from OSH for further details.  The patient has had blood sugars as high as 400 during the day. SSI increased from sensitive to moderate. Basal insulin qhs increased from 10u to 14u. - Will hold oral medications for now.  - Cont SSI and basal. Titrate doses to CBG as necessary  4) Uncontrolled hypertension - Coreg resumed at 12.5 bid yesterday (1/2 home dose) due to unknown compliance. Patient hypertensive to 180/60 this AM. Clonidine and lisinopril were restarted earlier in hospital course.  - inc coreg from 12.5mg  bid to 25mg  bid - cont to monitor blood pressure and adjust anti-HTN meds as necessary  5) Hypokalemia - Currently resolved. K+ = 3.75mEq/L today.  6) Hypothyroidism - on home synthroid. TSH and T4 within normal limits.   7) HLD - LDL of 49 and trig of 128 on 05/2011.  - continue statin   8) Recent right frontal stroke - cause unclear, although noted to have significant ischemic change (and multiple risk factors including uncontrolled HTN, DMII, HLD), was thought to be embolic at last hospitalization given lesions in multiple vascular distributions. However, no clear embolic source was identified. Pursuing temporary SNF placement with subsequent HH RN/PT/OT when patient returns to live with her daughter.  - Continue risk factor management for now, specifically improved DM  and HTN control.   9) Non-occlusive CAD - no acute CP, shortness of breath, etc.  - Continue ASA, statin, beta blocker   10) DVT PPX - heparin   11) Diet - Pureed currently.  Will transition to full diet after dentures are in place.     LOS: 3 days     Gee Habig R 05/31/2012, 11:40 AM

## 2012-05-31 NOTE — Progress Notes (Signed)
Clinical Social Work Department BRIEF PSYCHOSOCIAL ASSESSMENT 05/31/2012  Patient:  Jamie Cochran, Jamie Cochran     Account Number:  [id number]     Admit date:  05/28/2012  Clinical Social Worker:  Conley Simmonds  Date/Time:  05/31/2012 10:30 AM  Referred by:  Physician  Date Referred:  05/30/2012 Referred for  SNF Placement   Other Referral:   Interview type:  Other - See comment Other interview type:   Pt, husband and caregiver    PSYCHOSOCIAL DATA Living Status:  HUSBAND Admitted from facility:   Level of care:   Primary support name: Jamie Cochran  Primary support relationship to patient:   Degree of support available:   Strong/24 hour caregiver for husband    CURRENT CONCERNS Current Concerns  Post-Acute Placement   Other Concerns:    SOCIAL WORK ASSESSMENT / PLAN CSW met with pt at bedside to discuss ST SNF placement -pt alert and orientedx2-  While in pt room CSW contacted pt husband who was with private duty caregiver. CSW and husband discussed ST Rehab prior to returning home-Caregiver is familiar with multiple SNFs in [facility] and will assist pt and husband with identifying closest SNF-  CSW will initiate FL2 and f/u with pt and children.   Assessment/plan status:  Psychosocial Support/Ongoing Assessment of Needs Other assessment/ plan:   Information/referral to community resources:   ST SNf's within [address] of [facility]    PATIENT'S/FAMILY'S RESPONSE TO PLAN OF CARE: Pt husband and pt preference is to return home however husband understands that caregiver would not be able to provide adequate support for both pt and wife-  CSW will f/u with offers and facilitate d/c planning--  Jamie Cochran, [phone]

## 2012-06-01 LAB — GLUCOSE, CAPILLARY
Glucose-Capillary: 298 mg/dL — ABNORMAL HIGH (ref 70–99)
Glucose-Capillary: 318 mg/dL — ABNORMAL HIGH (ref 70–99)
Glucose-Capillary: 358 mg/dL — ABNORMAL HIGH (ref 70–99)

## 2012-06-01 LAB — BASIC METABOLIC PANEL
BUN: 18 mg/dL (ref 6–23)
CO2: 25 mEq/L (ref 19–32)
Creatinine, Ser: 0.5 mg/dL (ref 0.50–1.10)
GFR calc non Af Amer: 90 mL/min — ABNORMAL LOW (ref >90)
Glucose, Bld: 291 mg/dL — ABNORMAL HIGH (ref 70–99)
Potassium: 3.9 mEq/L (ref 3.5–5.1)
Sodium: 134 mEq/L — ABNORMAL LOW (ref 135–145)

## 2012-06-01 MED ORDER — TRAMADOL HCL 50 MG PO TABS
50.0000 mg | ORAL_TABLET | Freq: Four times a day (QID) | ORAL | Status: DC | PRN
  Administered 2012-06-01 – 2012-06-03 (×3): 50 mg via ORAL
  Filled 2012-06-01 (×4): qty 1

## 2012-06-01 MED ORDER — ACETAMINOPHEN 325 MG PO TABS: 650.0000 mg | ORAL_TABLET | Freq: Four times a day (QID) | ORAL | Status: DC | PRN

## 2012-06-01 MED ORDER — INSULIN GLARGINE 100 UNIT/ML ~~LOC~~ SOLN
20.0000 [IU] | Freq: Every day | SUBCUTANEOUS | Status: DC
  Administered 2012-06-01: 20 [IU] via SUBCUTANEOUS

## 2012-06-01 NOTE — ED Provider Notes (Signed)
Medical screening examination/treatment/procedure(s) were conducted as a shared visit with non-physician practitioner(s) and myself.  I personally evaluated the patient during the encounter  Cheri Guppy, MD 06/01/12 2333
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
## 2012-06-01 NOTE — Discharge Summary (Signed)
Patient Name:  Jamie Cochran MRN: [id number]  PCP: No primary provider on file. DOB:  [birth year]       Date of Admission:  05/28/2012  Date of Discharge:  06/03/2012      Attending Physician: Dr. Burns Spain, MD         DISCHARGE DIAGNOSES: 1) Slurred speech and right-sided weakness - likely secondary to residual deficits from her recent stroke, in the setting of volume depletion. It is also possible that patient experienced hypoglycemia on the morning of admission that mimicked stroke-like symptoms.  2) History of recent right ischemic frontal stroke - During recent hospitalization in 05/2012 at [facility], [place], the patient was noted to have deep right frontal stroke. Repeat MRI negative for new stroke.  3) Altered mental status - Likely secondary to prolonged delirium on top of baseline dementia, in the setting of recent prolonged hospitalization courses and recent stroke.  4) DMII, uncontrolled - A1c 10.6 in 05/2012 - continued on insulin, regimen adjusted.  5) Uncontrolled hypertension - home antihypertensive regimen adjusted. 6) Hypothyroidism - on home synthroid, TSH/ free T4 were within normal limtis.  7) HLD - LDL of 49 and trig of 128 on 05/2012. Continue home statin, discontinue tricor at this time, as does not seem necessary.  8) Non-occlusive CAD  9) Carotid artery disease = bilateral.   DISPOSITION AND FOLLOW-UP: IRAIDA CRAGIN is to follow-up with the listed providers as detailed below, at which time, the following should be addressed:   1. SNF:  DMII - please assess blood sugar control, adjust as indicated.  HTN - please assess control and adjust as appropriate. 2. Labs / imaging needed: BMET to assess renal function, please check CBGs to assess blood sugar control  3. Pending labs/ test needing follow-up: Blood culture.   DISCHARGE INSTRUCTIONS: Follow-up Information    Follow up with Primary care provider. (Please make an appointment with  your PCP to follow-up on your current admission to the hospital within the next month.)         Discharge Orders    Future Orders Please Complete By Expires   Diet - low sodium heart healthy      Other Restrictions      Comments:   Walk with assistance only.      DISCHARGE MEDICATIONS: Medication List  As of 06/03/2012  2:56 PM   STOP taking these medications         amoxicillin-clavulanate 875-125 MG per tablet      celecoxib 200 MG capsule      fenofibrate 48 MG tablet      glimepiride 4 MG tablet         TAKE these medications         acetaminophen 325 MG tablet   Commonly known as: TYLENOL   Take 2 tablets (650 mg total) by mouth every 6 (six) hours as needed.      amLODipine 5 MG tablet   Commonly known as: NORVASC   Take 5 mg by mouth daily.      aspirin 81 MG tablet   Take 81 mg by mouth daily.      atorvastatin 40 MG tablet   Commonly known as: LIPITOR   Take 40 mg by mouth daily.      carvedilol 25 MG tablet   Commonly known as: COREG   Take 25 mg by mouth 2 (two) times daily with a meal.      cholecalciferol 400  UNITS Tabs   Commonly known as: VITAMIN D   Take 400 Units by mouth daily.      cloNIDine 0.1 MG tablet   Commonly known as: CATAPRES   Take 0.1 mg by mouth 2 (two) times daily.      furosemide 20 MG tablet   Commonly known as: LASIX   Take 20 mg by mouth 2 (two) times daily.      insulin aspart 100 UNIT/ML injection   Commonly known as: novoLOG   Inject 0-15 Units into the skin 3 (three) times daily with meals.      insulin glargine 100 UNIT/ML injection   Commonly known as: LANTUS   Inject 24 Units into the skin at bedtime.      KOMBIGLYZE XR 2.03-999 MG Tb24   Generic drug: Saxagliptin-Metformin   Take 1 tablet by mouth 2 (two) times daily.      levothyroxine 75 MCG tablet   Commonly known as: SYNTHROID, LEVOTHROID   Take 75 mcg by mouth daily.      lisinopril 10 MG tablet   Commonly known as: PRINIVIL,ZESTRIL   Take 1  tablet (10 mg total) by mouth daily.      omeprazole 40 MG capsule   Commonly known as: PRILOSEC   Take 40 mg by mouth daily.             CONSULTS:    None.  He PROCEDURES PERFORMED:  Dg Chest 2 View  05/28/2012  *RADIOLOGY REPORT*  Clinical Data: Altered mental status.  CHEST - 2 VIEW  Comparison: 04/05/2009  Findings: Mild cardiomegaly.  Low lung volumes without focal opacity, effusion or edema.  No acute bony abnormality.  Mild old compression fracture in the upper lumbar spine or lower thoracic spine, stable.  Mild degenerative changes in the thoracolumbar spine.  IMPRESSION: Cardiomegaly.  Low lung volumes.  No acute findings.  Original Report Authenticated By: Cyndie Chime, M.D.   Dg Lumbar Spine Complete  05/03/2012  *RADIOLOGY REPORT*  Clinical Data: .  Back and hip pain.  LUMBAR SPINE - COMPLETE 4+ VIEW  Comparison: CT abdomen and pelvis 06/15/2006.  Findings: There is convex left scoliosis.  Multilevel degenerative disease is identified.  The patient has a T12 compression fracture which is remote.  No other fracture is identified.  IMPRESSION: No acute finding.  Scoliosis, degenerative disease and remote T12 compression fracture noted.  Original Report Authenticated By: Bernadene Bell. D'ALESSIO, M.D.   Dg Hip Complete Left  05/03/2012  *RADIOLOGY REPORT*  Clinical Data: Status post fall times two.  Pain.  LEFT HIP - COMPLETE 2+ VIEW  Comparison: None.  Findings: No acute bony or joint abnormality is identified.  No notable degenerative change.  Surgical clips in the right groin are noted.  IMPRESSION: No acute finding.  Original Report Authenticated By: Bernadene Bell. Maricela Curet, M.D.   Ct Head Wo Contrast  05/28/2012  *RADIOLOGY REPORT*  Clinical Data: Altered mental status.  Slurred speech.  Recent discharge for stroke from outside facility.  CT HEAD WITHOUT CONTRAST  Technique:  Contiguous axial images were obtained from the base of the skull through the vertex without contrast.   Comparison: 02/09/2009.  Findings: Slight asymmetric hypodensity right insular cortex and and adjacent white matter are could represent acute, subacute, or chronic ischemia.  No CT signs of proximal vascular thrombosis. No areas of hemorrhage or mass lesion. Mild atrophy with chronic microvascular ischemic change is suspected.  Calvarium is intact. Clear sinuses.  Chronic mastoid changes  on the right.  Slight progression atrophy and white matter change compared with 2010.  IMPRESSION: Slight asymmetric hypodensity right insular cortex and adjacent white matter could be acute, subacute or chronic.  No acute hemorrhage or well-demarcated large vessel cortical infarct is observed.  Original Report Authenticated By: Elsie Stain, M.D.   Ct Lumbar Spine Wo Contrast  05/03/2012  *RADIOLOGY REPORT*  Clinical Data: Fall.  Back pain  CT LUMBAR SPINE WITHOUT CONTRAST  Technique:  Multidetector CT imaging of the lumbar spine was performed without intravenous contrast administration. Multiplanar CT image reconstructions were also generated.  Comparison: Lumbar radiographs today.  CT abdomen 06/15/2006  Findings: Remote fracture T12 is unchanged from 2007.  There is endplate irregularity and spurring associated with this fracture. No acute fracture.  No mass lesion is identified.  Lumbar scoliosis present.  T12-L1:  Mild disc degeneration.  Nonobstructing 3 mm left  kidney stone.  4 mm retrolisthesis L1-2 without significant stenosis.  L2-3:  Mild disc degeneration with disc bulging and mild spurring.  L3-4:  Disc bulging and calcified annular fibers.  Mild facet degeneration and mild spinal stenosis.  L4-5:  Disc bulging and mild facet degeneration.  L5-S1:  Mild disc degeneration and disc bulging.  Moderate facet hypertrophy bilaterally without significant spinal stenosis.  IMPRESSION: Chronic fracture of L1.  No acute fracture.  Scoliosis and lumbar degenerative changes.  Original Report Authenticated By: Camelia Phenes,  M.D.   Mr Brain Wo Contrast  05/29/2012  *RADIOLOGY REPORT*  Clinical Data: Right-sided weakness and severe confusion.  Recent CVA and UTI.  Persistent confusion.  MRI HEAD WITHOUT CONTRAST  Technique:  Multiplanar, multiecho pulse sequences of the brain and surrounding structures were obtained according to standard protocol without intravenous contrast.  Comparison: CT head without contrast 05/28/2012.  Findings: An acute non hemorrhagic infarct is present in the posterior right corona radiata.  There is associated T2 and FLAIR hyperintensity with this lesion.  Mild periventricular and scattered subcortical T2 and FLAIR hyperintensity is present otherwise.  There is no hemorrhage or mass lesion.  The ventricles are proportionate to the degree of atrophy.  No significant extra- axial fluid collection is present.  Flow is present in the major intracranial arteries.  The patient is status post left lens extraction.  The globes orbits are otherwise intact.  The paranasal sinuses are clear. Postoperative changes of the right mastoid are again noted.  IMPRESSION:  1.  Acute/subacute non hemorrhagic infarct within the posterior right corona radiata. 2.  Atrophy and white matter disease otherwise likely reflects the sequelae of chronic microvascular ischemia. 3.  Postoperative changes of the left globe and right mastoid.  Original Report Authenticated By: Jamesetta Orleans. MATTERN, M.D.       ADMISSION DATA: H&P: Patient is a 78 y.o. female with a PMHx of DMII (A1c of 10.6), nonobstructive CAD, hypothyroidism,HTN, and recent stroke 1 week ago (diagnosed in [place], [place]) who is being admitted to [facility] on 05/28/2012 for evaluation of altered mental status. Patient was brought in by her daughter who states that she was "talking out of her head" and not making logical sense. The majority of the history was provided by the daughter, who states that the patient has had a fluctuating level of consciousness  since being admitted to a hospital in [place], [place] for a UTI, hyperglycemia, and AMS. The patient was sent home on antibiotics for UTI. Due to no improvement in her AMS, she returned to the hospital on  05/23/2012, and was diagnosed with an acute ischemic infarct in the right frontal lobe by MRI. The patient was discharged following what was apparently a 3 day hospital course. In the four days since this discharge, the daughter states that sometimes the mother is lucid and oriented, and at other times she is disoriented and does not make sense when she talks.  The daughter denies noticing any neurologic symptoms in her mother, other than unsteady balance and overall weakness/fatigue, which has been present for approximately 10 days. She states that also prior to the last 10 days her mother has never talked nonsensically, or had the symptoms of fluctuating consciousness that she has exhibited since that time. She states that she believes her mother has mild dementia, and cites examples including losing her keys around the house, and misplacing other items. She states that her mother has been able to fully care for herself prior to 10 days ago, and that she has shown no deterioration in any of her activities of daily living.  The patient herself is difficult to obtain a history from, secondary to drowsiness and AMS. She is, however, able to state that she has been experiencing dysuria. She denies any other complains and states that she wants to go home.    Physical Exam: Vital Signs:  Blood pressure 183/69, pulse 68, temperature 99.6 F (37.6 C), temperature source Oral, resp. rate 16, SpO2 95.00%.   Physical Exam:  General:  Vital signs reviewed and noted. Well-developed, well-nourished, in no acute distress. Alert at times, decreased level of consciousness at others   Head:  Normocephalic, atraumatic.   Eyes:  PERRL. EOM not able to be assessed   Nose:  Mucous membranes dry, non-inflammed.   Throat:   Oropharynx nonerythematous, no exudate appreciated.   Lungs:  Normal respiratory effort. Clear to auscultation BL without crackles or wheezes. To note, exam was limited 2/2 patient not able to inspire deeply on request   Heart:  RRR. S1 and S2 normal. Systolic murmur appreciated, most notable in R 2nd intercostal space   Abdomen:  BS normoactive. Soft, Nondistended, non-tender.   Extremities:  1+ pretibial edema.   Neurologic:  Oriented x 1 (person). Motor strength is 5/5 in the all 4 extremities. Upward plantar reflex on L. CN exam (and overall neuro exam) limited by patient's AMS.   Skin:  No visible rashes, scars.     Lab results:  Basic Metabolic Panel:   Basename  05/28/12 1404   NA  136   K  3.5   CL  99   CO2  27   GLUCOSE  234*   BUN  11   CREATININE  0.54   CALCIUM  9.2   MG  --   PHOS  --    CBC:   Basename  05/28/12 1404   WBC  7.4   NEUTROABS  4.2   HGB  12.9   HCT  37.5   MCV  89.3   PLT  170    Alcohol Level:   Basename  05/28/12 1426   ETH  <11    Urinalysis:   Basename  05/28/12 1431   COLORURINE  YELLOW   LABSPEC  1.019   PHURINE  7.5   GLUCOSEU  500*   HGBUR  NEGATIVE   BILIRUBINUR  NEGATIVE   KETONESUR  NEGATIVE   PROTEINUR  NEGATIVE   UROBILINOGEN  1.0   NITRITE  NEGATIVE   LEUKOCYTESUR  NEGATIVE  HOSPITAL COURSE: 1) Slurred speech and right-sided weakness - the symptoms were noted by the PCP on the day of admission. By the time of evaluation, although the patient is tangential in her thought process, the slurring of her speech is not significant. MRI was negative for evidence of new stroke. Therefore, symptoms noted by her PCP were likely secondary to residual deficits from her recent stroke, in the setting of volume depletion. It is also possible that patient experienced hypoglycemia on the morning of admission that mimicked stroke-like symptoms. She did not check blood glucose during PCP visit, therefore, this cannot be ascertained.  Patient had stabilization of symptoms during hospital course, and was evaluated by PT/ OT services who recommended short term SNF.   2) History of recent right ischemic frontal stroke - During recent hospitalization in 05/2012 at [facility], [place], the patient was noted to have deep right frontal stroke. MRI in our facility did not indicate evidence of new acute stroke, and only evidenced the stroke previously noted at [facility]. During prior evaluation in [place], bilateral carotid dopplers indicated no evidence of flow limiting stenosis, with an estimated 0-49% stenosis bilaterally. As well, 2-D echocardiogram showed an EF of 55-65% without regional wall motion abnormalities. Secondary to recent stroke, efforts were made towards risk factor management, most importantly addressing improved control of her previously uncontrolled HTN and DMII. After the new MRI ruled out acute stroke, we adjusted her antihypertensive regimen, and achieved improved control, as will be detailed below in the hypertension. Patient will be dc'ed to SNF for continued PT/ OT services.  3) Altered mental status - Improved and almost to baseline at time of discharge. Likely secondary to prolonged delirium on top of baseline dementia, in the setting of recent prolonged hospitalization courses and recent stroke. Infectious etiology were investigated, but ruled out. Specifically, CXR was negative for acute pulmonary process and UA was negative for evidence of UTI. TSH was also within normal limits.   4) DMII, uncontrolled - A1c 10.6 in 05/2012 - patient was continued on insulin during the hospital course, which was recently started during her recent admission in [facility]. However, we slowly escalated her insulin because recently too aggressive insulin initiation resulted in hypoglycemia to 45 mg/dL when she was readmitted in [facility]. She was also started on, and will continue on SSI TID. She was also resumed of her metformin. Her regimen will need to be followed  and adjusted as an outpatient.   5) Uncontrolled hypertension - patient has baseline uncontrolled HTN, which is thought to have contributed towards her recent stroke. During hospital course, initially she was allowed for permissive hypertension with goal BP < 220/120 mmHg while acute stroke was investigated. After acute stroke ruled out, her medication regimen was adjusted and esclated, with significant improvement of blood pressure control. BP on admission was in 180s systolic, and are 130s on day of discharge.   6) Hypothyroidism - on home synthroid, TSH/ free T4 were within normal limtis.   7) HLD - LDL of 49 and trig of 128 on 05/2012. Continue home statin, discontinue tricor at this time, as does not seem necessary.   8) Non-occlusive CAD - no acute CP, shortness of breath, etc.  - Continue ASA, statin, beta blocker were continued.  9) Carotid artery disease - Bilateral carotid artery stenosis with 0-49% stenosis bilaterally noted per carotid dopplers in [place]. Continued on aspirin, statin.   DISCHARGE DATA: Vital Signs: BP 133/51  Pulse 66  Temp 97.9 F (36.6 C) (  Oral)  Resp 17  Ht 4\' 8"  (1.422 m)  Wt 124 lb 5.4 oz (56.4 kg)  BMI 27.88 kg/m2  SpO2 99%  Labs: Results for orders placed during the hospital encounter of 05/28/12 (from the past 24 hour(s))  GLUCOSE, CAPILLARY     Status: Abnormal   Collection Time   06/02/12 10:08 PM      Component Value Range   Glucose-Capillary 378 (*) 70 - 99 mg/dL   Comment 1 Notify RN     Comment 2 Documented in Chart    GLUCOSE, CAPILLARY     Status: Abnormal   Collection Time   06/03/12  7:06 AM      Component Value Range   Glucose-Capillary 208 (*) 70 - 99 mg/dL   Comment 1 Documented in Chart    BASIC METABOLIC PANEL     Status: Abnormal   Collection Time   06/03/12  8:20 AM      Component Value Range   Sodium 136  135 - 145 mEq/L   Potassium 3.8  3.5 - 5.1 mEq/L   Chloride 97  96 - 112 mEq/L   CO2 30  19 - 32 mEq/L   Glucose, Bld  193 (*) 70 - 99 mg/dL   BUN 25 (*) 6 - 23 mg/dL   Creatinine, Ser 1.61  0.50 - 1.10 mg/dL   Calcium 9.6  8.4 - 09.6 mg/dL   GFR calc non Af Amer 89 (*) >90 mL/min   GFR calc Af Amer >90  >90 mL/min  GLUCOSE, CAPILLARY     Status: Abnormal   Collection Time   06/03/12 11:50 AM      Component Value Range   Glucose-Capillary 168 (*) 70 - 99 mg/dL    Time spent on discharge: > 45 MINUTES   Signed: Johnette Abraham, Roma Schanz, Internal Medicine Resident Pager: [phone] (7AM-5PM) 06/03/2012, 2:57 PM

## 2012-06-02 LAB — BASIC METABOLIC PANEL
BUN: 25 mg/dL — ABNORMAL HIGH (ref 6–23)
Calcium: 9.7 mg/dL (ref 8.4–10.5)
Creatinine, Ser: 0.58 mg/dL (ref 0.50–1.10)
GFR calc non Af Amer: 85 mL/min — ABNORMAL LOW (ref >90)
Glucose, Bld: 300 mg/dL — ABNORMAL HIGH (ref 70–99)

## 2012-06-02 LAB — GLUCOSE, CAPILLARY: Glucose-Capillary: 283 mg/dL — ABNORMAL HIGH (ref 70–99)

## 2012-06-02 MED ORDER — INSULIN GLARGINE 100 UNIT/ML ~~LOC~~ SOLN: 24.0000 [IU] | Freq: Every day | SUBCUTANEOUS | Status: DC

## 2012-06-02 MED ORDER — INSULIN ASPART 100 UNIT/ML ~~LOC~~ SOLN
3.0000 [IU] | Freq: Three times a day (TID) | SUBCUTANEOUS | Status: DC
  Administered 2012-06-03 (×3): 3 [IU] via SUBCUTANEOUS

## 2012-06-02 MED ORDER — INSULIN ASPART 100 UNIT/ML ~~LOC~~ SOLN
0.0000 [IU] | Freq: Three times a day (TID) | SUBCUTANEOUS | Status: DC
  Administered 2012-06-03: 5 [IU] via SUBCUTANEOUS
  Administered 2012-06-03: 3 [IU] via SUBCUTANEOUS
  Administered 2012-06-03: 5 [IU] via SUBCUTANEOUS

## 2012-06-02 NOTE — Progress Notes (Signed)
Subjective: The patient has no complaints other than that she wishes to go home today. She no longer complains of back pain, which she had yesterday. She states that she has eaten all her meals without difficulty.   One of her grandsons (who is an adult) was present at time of exam. He stated that the family was currently working towards finding the patient an appropriate SNF to complete her stroke rehab.   Objective: Vital signs in last 24 hours: Filed Vitals:   06/01/12 2219 06/02/12 0209 06/02/12 0620 06/02/12 1022  BP: 153/80 141/79 142/53 140/88  Pulse: 72 71 63 65  Temp: 98.1 F (36.7 C) 97.9 F (36.6 C) 97.3 F (36.3 C) 97.8 F (36.6 C)  TempSrc: Oral Oral Oral Oral  Resp: 18 20 16 18   Height:      Weight:      SpO2: 93% 96% 98% 98%   Weight change:   Intake/Output Summary (Last 24 hours) at 06/02/12 1339 Last data filed at 06/02/12 7829  Gross per 24 hour  Intake     50 ml  Output      0 ml  Net     50 ml   Physical Exam:  General: Consistently alert and awake. Unchanged since previous exam.  CV: RRR; normal S1 and S2. 3/6 systolic murmur  Resp: CTA bilaterally; no wheezes, rales, rhonchi  Abd: Soft, non-tender, non-distended; normoactive BS  Ext: no pretibial edema  Neuro: Oriented x 2 (person, time) Psych: Appropriate.   Lab Results: Basic Metabolic Panel:  Lab 06/02/12 5621 06/01/12 0705  NA 133* 134*  K 4.0 3.9  CL 95* 98  CO2 29 25  GLUCOSE 300* 291*  BUN 25* 18  CREATININE 0.58 0.50  CALCIUM 9.7 9.4  MG -- --  PHOS -- --   Liver Function Tests:  Lab 05/29/12 0535  AST 14  ALT 10  ALKPHOS 49  BILITOT 0.8  PROT 6.3  ALBUMIN 3.1*   CBC:  Lab 05/30/12 1350 05/28/12 1404  WBC 7.4 7.4  NEUTROABS 5.0 4.2  HGB 13.5 12.9  HCT 38.8 37.5  MCV 88.2 89.3  PLT 194 170   Cardiac Enzymes:  Lab 05/29/12 1202 05/29/12 0555 05/28/12 2101  CKTOTAL 33 17 18  CKMB 1.0 0.7 0.7  CKMBINDEX -- -- --  TROPONINI <0.30 <0.30 <0.30   CBG:  Lab  06/02/12 1128 06/02/12 0639 06/01/12 2214 06/01/12 1652 06/01/12 1200 06/01/12 0718  GLUCAP 338* 283* 358* 318* 298* 275*   Fasting Lipid Panel:  Lab 05/29/12 0535  CHOL 116  HDL 33*  LDLCALC 60  TRIG 308  CHOLHDL 3.5  LDLDIRECT --   Thyroid Function Tests:  Lab 05/28/12 2101  TSH 1.260  T4TOTAL --  FREET4 1.32  T3FREE --  THYROIDAB --    Alcohol Level:  Lab 05/28/12 1426  ETH <11   Urinalysis:  Lab 05/28/12 1431  COLORURINE YELLOW  LABSPEC 1.019  PHURINE 7.5  GLUCOSEU 500*  HGBUR NEGATIVE  BILIRUBINUR NEGATIVE  KETONESUR NEGATIVE  PROTEINUR NEGATIVE  UROBILINOGEN 1.0  NITRITE NEGATIVE  LEUKOCYTESUR NEGATIVE   Micro Results: Recent Results (from the past 240 hour(s))  CULTURE, BLOOD (ROUTINE X 2)     Status: Normal (Preliminary result)   Collection Time   05/29/12  5:54 PM      Component Value Range Status Comment   Specimen Description BLOOD LEFT HAND   Final    Special Requests BOTTLES DRAWN AEROBIC ONLY 10CC   Final  Culture  Setup Time 05/29/2012 23:51   Final    Culture     Final    Value:        BLOOD CULTURE RECEIVED NO GROWTH TO DATE CULTURE WILL BE HELD FOR 5 DAYS BEFORE ISSUING A FINAL NEGATIVE REPORT   Report Status PENDING   Incomplete   CULTURE, BLOOD (ROUTINE X 2)     Status: Normal (Preliminary result)   Collection Time   05/29/12  5:54 PM      Component Value Range Status Comment   Specimen Description BLOOD RIGHT ARM   Final    Special Requests BOTTLES DRAWN AEROBIC AND ANAEROBIC 10CC   Final    Culture  Setup Time 05/29/2012 23:51   Final    Culture     Final    Value:        BLOOD CULTURE RECEIVED NO GROWTH TO DATE CULTURE WILL BE HELD FOR 5 DAYS BEFORE ISSUING A FINAL NEGATIVE REPORT   Report Status PENDING   Incomplete    Studies/Results: No results found. Medications: I have reviewed the patient's current medications. Scheduled Meds:   . aspirin EC  81 mg Oral Daily  . atorvastatin  20 mg Oral q1800  . carvedilol  25 mg  Oral BID WC  . cloNIDine  0.1 mg Transdermal Weekly  . enoxaparin (LOVENOX) injection  40 mg Subcutaneous Q24H  . feeding supplement  1 Container Oral TID BM  . insulin aspart  0-15 Units Subcutaneous TID WC  . insulin glargine  20 Units Subcutaneous QHS  . levothyroxine  75 mcg Oral Daily  . lisinopril  10 mg Oral Daily   Continuous Infusions:  PRN Meds:.acetaminophen, senna-docusate, traMADol Assessment/Plan: Pt is a 78 y.o. yo female with a PMHx of nonobstructive CAD, uncontrolled DMII (A1c 10.6), hypothyroidism, and recent right frontal stroke in July 2013 who was admitted on 05/28/2012 with symptoms of possible slurred speech and right arm weakness noted by her PCP on the day of admission. At the time of admission, no neurologic deficits can be appreciated, although the patient was only oriented x1.   1) Recent right frontal stroke/AMS - patient is approximately 10 days out from her MRI confirmed ischemic stroke which was confirmed in [place], [place]. The patient no longer seems altered in her mental status, however her dementia is significantly worsened from her previous baseline 2 weeks ago. Memory deficits seem to be slowly improving.  - stroke rehab at [facility] following d/c  2) DMII, uncontrolled - A1c 10.6 in 05/2012 - Fasting glucose 283 this AM, and random glucose 300. SSI moderate coverage and 20U lantus qhs currently. - Increase lantus 20U -> 24U - Consider increase meal coverage SSI  3) Hypothyroidism - on home synthroid. TSH and T4 within normal limits.   4) HLD - LDL of 49 and trig of 128 on 05/2011.  - continue statin   5) Non-occlusive CAD - no acute CP, shortness of breath, etc.  - Continue ASA, statin, beta blocker   7) DVT PPX - heparin   11) Diet - Advanced to full carb mod diet.    LOS: 5 days   Janeli Lewison R 06/02/2012, 1:39 PM

## 2012-06-03 LAB — GLUCOSE, CAPILLARY
Glucose-Capillary: 208 mg/dL — ABNORMAL HIGH (ref 70–99)
Glucose-Capillary: 168 mg/dL — ABNORMAL HIGH (ref 70–99)

## 2012-06-03 LAB — BASIC METABOLIC PANEL
BUN: 25 mg/dL — ABNORMAL HIGH (ref 6–23)
GFR calc Af Amer: >90 mL/min (ref >90)
GFR calc non Af Amer: 89 mL/min — ABNORMAL LOW (ref >90)
Glucose, Bld: 193 mg/dL — ABNORMAL HIGH (ref 70–99)
Potassium: 3.8 mEq/L (ref 3.5–5.1)

## 2012-06-03 MED ORDER — CLONIDINE HCL 0.1 MG PO TABS
0.1000 mg | ORAL_TABLET | Freq: Two times a day (BID) | ORAL | Status: DC
  Administered 2012-06-03: 0.1 mg via ORAL
  Filled 2012-06-03 (×2): qty 1

## 2012-06-03 MED ORDER — ACETAMINOPHEN 325 MG PO TABS: 650.0000 mg | ORAL_TABLET | Freq: Four times a day (QID) | ORAL | Status: AC | PRN

## 2012-06-03 MED ORDER — INSULIN GLARGINE 100 UNIT/ML ~~LOC~~ SOLN: 20.0000 [IU] | Freq: Every day | SUBCUTANEOUS | Status: DC

## 2012-06-03 MED ORDER — INSULIN GLARGINE 100 UNIT/ML ~~LOC~~ SOLN: 24.0000 [IU] | Freq: Every day | SUBCUTANEOUS | Status: DC

## 2012-06-03 MED ORDER — INSULIN ASPART 100 UNIT/ML ~~LOC~~ SOLN: 0.0000 [IU] | Freq: Three times a day (TID) | SUBCUTANEOUS | Status: DC

## 2012-06-03 MED ORDER — LISINOPRIL 10 MG PO TABS: 10.0000 mg | ORAL_TABLET | Freq: Every day | ORAL | Status: DC

## 2012-06-03 NOTE — Progress Notes (Signed)
Patient d/c to [facility] Transported by [facility] D/c packet and patient's belongings sent with patient  No c/o pain at d/c Report called to Delfino Lovett Nurse Fredrick [place], Kae Heller

## 2012-06-03 NOTE — Progress Notes (Signed)
Subjective: Patient states she is doing well today, and ready to go home. Denies fever, chills, chest pain, shortness of breath. Denies any back pain today.  No overnight events.  Objective: Vital signs in last 24 hours: Filed Vitals:   06/02/12 1022 06/03/12 0238 06/03/12 0630 06/03/12 1000  BP: 140/88 140/81 136/74 133/51  Pulse: 65 69 70 66  Temp: 97.8 F (36.6 C) 98 F (36.7 C) 98.6 F (37 C) 97.9 F (36.6 C)  TempSrc: Oral Oral Oral Oral  Resp: 18 18 18 17   Height:      Weight:      SpO2: 98% 98% 98% 99%   Weight change:  No intake or output data in the 24 hours ending 06/03/12 1624  Physical Exam:  General: Consistently alert and awake. Unchanged since previous exam.  CV: RRR; normal S1 and S2. 3/6 systolic murmur  Resp: CTA bilaterally; no wheezes, rales, rhonchi  Abd: Soft, non-tender, non-distended; normoactive BS  Ext: no pretibial edema  Neuro: Oriented x 2.  Psych: Appropriate.   Lab Results: Basic Metabolic Panel:  Lab 06/03/12 4166 06/02/12 0840  NA 136 133*  K 3.8 4.0  CL 97 95*  CO2 30 29  GLUCOSE 193* 300*  BUN 25* 25*  CREATININE 0.52 0.58  CALCIUM 9.6 9.7  MG -- --  PHOS -- --   Liver Function Tests:  Lab 05/29/12 0535  AST 14  ALT 10  ALKPHOS 49  BILITOT 0.8  PROT 6.3  ALBUMIN 3.1*   CBC:  Lab 05/30/12 1350 05/28/12 1404  WBC 7.4 7.4  NEUTROABS 5.0 4.2  HGB 13.5 12.9  HCT 38.8 37.5  MCV 88.2 89.3  PLT 194 170   Cardiac Enzymes:  Lab 05/29/12 1202 05/29/12 0555 05/28/12 2101  CKTOTAL 33 17 18  CKMB 1.0 0.7 0.7  CKMBINDEX -- -- --  TROPONINI <0.30 <0.30 <0.30   CBG:  Lab 06/03/12 1150 06/03/12 0706 06/02/12 2208 06/02/12 1128 06/02/12 0639 06/01/12 2214  GLUCAP 168* 208* 378* 338* 283* 358*   Fasting Lipid Panel:  Lab 05/29/12 0535  CHOL 116  HDL 33*  LDLCALC 60  TRIG 063  CHOLHDL 3.5  LDLDIRECT --   Thyroid Function Tests:  Lab 05/28/12 2101  TSH 1.260  T4TOTAL --  FREET4 1.32  T3FREE --  THYROIDAB  --   Alcohol Level:  Lab 05/28/12 1426  ETH <11   Urinalysis:  Lab 05/28/12 1431  COLORURINE YELLOW  LABSPEC 1.019  PHURINE 7.5  GLUCOSEU 500*  HGBUR NEGATIVE  BILIRUBINUR NEGATIVE  KETONESUR NEGATIVE  PROTEINUR NEGATIVE  UROBILINOGEN 1.0  NITRITE NEGATIVE  LEUKOCYTESUR NEGATIVE   Micro Results: Recent Results (from the past 240 hour(s))  CULTURE, BLOOD (ROUTINE X 2)     Status: Normal (Preliminary result)   Collection Time   05/29/12  5:54 PM      Component Value Range Status Comment   Specimen Description BLOOD LEFT HAND   Final    Special Requests BOTTLES DRAWN AEROBIC ONLY 10CC   Final    Culture  Setup Time 05/29/2012 23:51   Final    Culture     Final    Value:        BLOOD CULTURE RECEIVED NO GROWTH TO DATE CULTURE WILL BE HELD FOR 5 DAYS BEFORE ISSUING A FINAL NEGATIVE REPORT   Report Status PENDING   Incomplete   CULTURE, BLOOD (ROUTINE X 2)     Status: Normal (Preliminary result)   Collection Time  05/29/12  5:54 PM      Component Value Range Status Comment   Specimen Description BLOOD RIGHT ARM   Final    Special Requests BOTTLES DRAWN AEROBIC AND ANAEROBIC 10CC   Final    Culture  Setup Time 05/29/2012 23:51   Final    Culture     Final    Value:        BLOOD CULTURE RECEIVED NO GROWTH TO DATE CULTURE WILL BE HELD FOR 5 DAYS BEFORE ISSUING A FINAL NEGATIVE REPORT   Report Status PENDING   Incomplete    Studies/Results: No results found. Medications: I have reviewed the patient's current medications. Scheduled Meds:   . aspirin EC  81 mg Oral Daily  . atorvastatin  20 mg Oral q1800  . carvedilol  25 mg Oral BID WC  . cloNIDine  0.1 mg Oral BID  . enoxaparin (LOVENOX) injection  40 mg Subcutaneous Q24H  . feeding supplement  1 Container Oral TID BM  . insulin aspart  0-15 Units Subcutaneous TID WC  . insulin aspart  3 Units Subcutaneous TID WC  . insulin glargine  24 Units Subcutaneous QHS  . levothyroxine  75 mcg Oral Daily  . lisinopril  10 mg  Oral Daily  . DISCONTD: cloNIDine  0.1 mg Transdermal Weekly   Continuous Infusions:  PRN Meds:.acetaminophen, senna-docusate, traMADol Assessment/Plan: Pt is a 78 y.o. yo female with a PMHx of nonobstructive CAD, uncontrolled DMII (A1c 10.6), hypothyroidism, and recent right frontal stroke in July 2013 who was admitted on 05/28/2012 with symptoms of possible slurred speech and right arm weakness noted by her PCP on the day of admission. At the time of admission, no neurologic deficits can be appreciated, although the patient was only oriented x1.   1) Recent right frontal stroke/AMS - patient is approximately 10 days out from her MRI confirmed ischemic stroke which was confirmed in [place], [place]. The patient no longer seems altered in her mental status, however her dementia is significantly worsened from her previous baseline 2 weeks ago. Memory deficits seem to be slowly improving.  - stroke rehab at [facility] following d/c   2) DMII, uncontrolled - A1c 10.6 in 05/2012 - Fasting glucose 283 this AM, and random glucose 300. SSI moderate coverage and 20U lantus qhs currently.  - continue lantus 20U -> 24U  - Continue meal coverage insulin and SSI   3) Hypothyroidism - on home synthroid. TSH and T4 within normal limits.   4) HLD - LDL of 49 and trig of 128 on 05/2011.  - continue statin   5) Non-occlusive CAD - no acute CP, shortness of breath, etc.  - Continue ASA, statin, beta blocker   7) DVT PPX - heparin   8) Diet - Advanced to full carb mod diet.  9) Dispo - Patient discharge to SNF today with rehabilitation for recent acute ischemic stroke.   LOS: 6 days   Elfredia Nevins 06/03/2012, 4:24 PM

## 2012-06-03 NOTE — Progress Notes (Signed)
Physical Therapy Treatment Patient Details Name: Jamie Cochran MRN: [id number] DOB: Sep 14, 1930 Today's Date: 06/03/2012 Time: 4098-1191 PT Time Calculation (min): 33 min  PT Assessment / Plan / Recommendation Comments on Treatment Session  Pt is pleasant and willing to participate in therapy.  Pt transfered to bedside commode for toileting and gown change.  Pt requires +2 assist for ambulation due to ataxic movements and frequent sudden stops and turns.  Pt was returned to bed with bed alarm on.  Family in room and eager to speak with social work in order to determine next steps.  Pt would benefit from PT to continue to work on safety and functional activiities.  Contiue with POC    Follow Up Recommendations  Skilled nursing facility;Supervision/Assistance - 24 hour    Barriers to Discharge        Equipment Recommendations  Defer to next venue    Recommendations for Other Services    Frequency Min 4X/week   Plan Discharge plan remains appropriate    Precautions / Restrictions Precautions Precautions: Fall Precaution Comments: ataxia Restrictions Weight Bearing Restrictions: No   Pertinent Vitals/Pain     Mobility  Bed Mobility Right Sidelying to Sit: 4: Min assist;HOB elevated;With rails Sitting - Scoot to Edge of Bed: 4: Min assist;With rail Sit to Supine: 4: Min assist;With rail;HOB flat Scooting to HOB: 3: Mod assist Details for Bed Mobility Assistance: Pt required VC for hand placement and to initiate movement.  Pt reached for assistance to sit upright in bed. Transfers Sit to Stand: 4: Min assist;With upper extremity assist;From toilet;From bed;With armrests Stand to Sit: 4: Min assist;To bed;To toilet;With upper extremity assist;With armrests Details for Transfer Assistance: Pt required A for balance due to ataxic movement of L  UE/LE Ambulation/Gait Ambulation/Gait Assistance: 1: +2 Total assist Ambulation/Gait: Patient Percentage: 80% Ambulation Distance  (Feet): 200 Feet Assistive device: 2 person hand held assist Ambulation/Gait Assistance Details: +2 assistance needed for ataxic gait, and unexpected and impulsive movements (due to distractiblity) Gait Pattern: Wide base of support;Ataxic;Lateral trunk lean to left;Lateral trunk lean to right Gait velocity: alternates fast/slow - sudden stops Stairs: No Modified Rankin (Stroke Patients Only) Modified Rankin: Moderately severe disability    Exercises     PT Diagnosis:    PT Problem List:   PT Treatment Interventions:     PT Goals Acute Rehab PT Goals PT Goal: Supine/Side to Sit - Progress: Progressing toward goal PT Goal: Sit at Edge Of Bed - Progress: Progressing toward goal PT Goal: Sit to Supine/Side - Progress: Progressing toward goal PT Goal: Ambulate - Progress: Progressing toward goal  Visit Information  Last PT Received On: 06/03/12 Assistance Needed: +2 (+ 2 for safety and balance)    Subjective Data      Cognition  Overall Cognitive Status: Impaired Area of Impairment: Attention;Memory;Following commands;Safety/judgement;Awareness of errors;Awareness of deficits;Problem solving Arousal/Alertness: Awake/alert Orientation Level: Disoriented to;Place;Time;Situation Behavior During Session: WFL for tasks performed Attention - Other Comments: Pt requires cueing to stay on task -- easily distracted    Balance  Static Sitting Balance Static Sitting - Balance Support: Bilateral upper extremity supported;Feet unsupported Static Sitting - Level of Assistance: 4: Min assist Static Sitting - Comment/# of Minutes: Pt with ataxic movements/jerking - on bedside commode (5 min)  End of Session PT - End of Session Equipment Utilized During Treatment: Gait belt Activity Tolerance: Patient tolerated treatment well Patient left: in bed;with call bell/phone within reach;with family/visitor present Nurse Communication: Mobility status   GP  Jamie Cochran, SPTA 06/03/2012,  9:27 AM

## 2012-06-03 NOTE — Progress Notes (Signed)
Seen and agreed 06/03/2012 Fredrich Birks PTA [phone] pager [phone] office

## 2012-06-04 LAB — CULTURE, BLOOD (ROUTINE X 2)

## 2012-06-04 NOTE — Care Management Note (Signed)
    Page 1 of 1   06/04/2012     12:09:21 PM   CARE MANAGEMENT NOTE 06/04/2012  Patient:  Jamie Cochran, Jamie Cochran   Account Number:  [id number]  Date Initiated:  06/04/2012  Documentation initiated by:  [facility]  Subjective/Objective Assessment:   Admitted with AMS, CVA. Lives with husband who requires assistance.     Action/Plan:   PT evalrecommended SNF  OT evalrecommened SNF   Anticipated DC Date:  06/03/2012   Anticipated DC Plan:  SKILLED NURSING FACILITY  In-house referral  Clinical Social Worker      DC Planning Services  CM consult      Choice offered to / List presented to:             Status of service:  Completed, signed off Medicare Important Message given?   (If response is "NO", the following Medicare IM given date fields will be blank) Date Medicare IM given:   Date Additional Medicare IM given:    Discharge Disposition:  SKILLED NURSING FACILITY  Per UR Regulation:  Reviewed for med. necessity/level of care/duration of stay  If discussed at Long Length of Stay Meetings, dates discussed:    Comments:  discharged 06/03/12 to [facility]

## 2012-06-06 MED FILL — Insulin Glargine Inj 100 Unit/ML: SUBCUTANEOUS | Qty: 0.24 | Status: AC

## 2012-06-06 MED FILL — Atorvastatin Calcium Tab 20 MG (Base Equivalent): ORAL | Qty: 1 | Status: AC

## 2012-06-06 MED FILL — Carvedilol Tab 25 MG: ORAL | Qty: 1 | Status: AC

## 2012-12-31 LAB — HEPATIC FUNCTION PANEL
Alkaline Phosphatase: 58 U/L (ref 25–125)
Bilirubin, Total: 0.6 mg/dL

## 2013-01-06 LAB — CBC AND DIFFERENTIAL
HCT: 36 % (ref 36–46)
Platelets: 193 10*3/uL (ref 150–399)

## 2013-01-06 LAB — BASIC METABOLIC PANEL: Creatinine: 0.8 mg/dL (ref 0.5–1.1)

## 2013-01-06 LAB — POCT ERYTHROCYTE SEDIMENTATION RATE, NON-AUTOMATED: Sed Rate: 5 mm

## 2013-01-18 ENCOUNTER — Encounter: Payer: Self-pay | Admitting: Nurse Practitioner

## 2013-01-29 ENCOUNTER — Telehealth: Payer: Self-pay | Admitting: *Deleted

## 2013-01-29 MED ORDER — CARVEDILOL 25 MG PO TABS: 25.0000 mg | ORAL_TABLET | Freq: Two times a day (BID) | ORAL | Status: DC

## 2013-01-29 NOTE — Telephone Encounter (Signed)
Received refill from [facility] for Carvedilol 12.5mg  bid. Chart has 25mg  bid. Sending chart back. Thanks.

## 2013-02-03 ENCOUNTER — Encounter: Payer: Self-pay | Admitting: Nurse Practitioner

## 2013-02-03 ENCOUNTER — Ambulatory Visit (INDEPENDENT_AMBULATORY_CARE_PROVIDER_SITE_OTHER): Payer: Medicare Other | Admitting: Nurse Practitioner

## 2013-02-03 VITALS — BP 118/68 | HR 74 | Temp 98.1°F | Ht <= 58 in | Wt 123.0 lb

## 2013-02-03 DIAGNOSIS — E785 Hyperlipidemia, unspecified: Secondary | ICD-10-CM

## 2013-02-03 DIAGNOSIS — I10 Essential (primary) hypertension: Secondary | ICD-10-CM

## 2013-02-03 DIAGNOSIS — E039 Hypothyroidism, unspecified: Secondary | ICD-10-CM

## 2013-02-03 DIAGNOSIS — I639 Cerebral infarction, unspecified: Secondary | ICD-10-CM

## 2013-02-03 DIAGNOSIS — N393 Stress incontinence (female) (male): Secondary | ICD-10-CM

## 2013-02-03 DIAGNOSIS — I509 Heart failure, unspecified: Secondary | ICD-10-CM

## 2013-02-03 DIAGNOSIS — E1110 Type 2 diabetes mellitus with ketoacidosis without coma: Secondary | ICD-10-CM

## 2013-02-03 DIAGNOSIS — I6350 Cerebral infarction due to unspecified occlusion or stenosis of unspecified cerebral artery: Secondary | ICD-10-CM

## 2013-02-03 NOTE — Patient Instructions (Signed)
Personal Hygiene Personal hygiene means keeping your body clean. Keeping your skin, hands, teeth, hair, nails, and feet clean every day are the best ways to keep infections away. Follow the guidelines below for ways to stay healthy and avoid spreading illness to others. HAND WASHING Your hands touch a lot of surfaces throughout the day. This can put germs on your hands. Washing them often and properly is a good way to prevent germs from spreading to others and making you ill.  When to wash your hands  Before and after preparing food.  Before and after caring for a wound on the skin.  Before eating.  Before touching your face.  After using the restroom.  After blowing your nose or coughing.  After touching animals.  After touching trash or something dirty.  After changing diapers.  After caring for others who are ill.  After gardening. How to wash your hands  Wet your hands with warm or cold running water.  Scrub and rub your hands together with soap for 20 to 30 seconds. (It may help to hum a short tune each time that is about this long.)  Make sure you wash all areas including between fingers and under nails.  Rinse well.  Dry your hands with a clean towel. Use disposable towels or dryers in restrooms.  If running water is not available, use an alcohol-based hand sanitizer. Rub it all over the surfaces of your hands. It can safely remove many, but not all, of the germs on your hands. Teaching children good hand washing practices early can benefit them throughout life and keep your family healthy. FINGERNAIL CARE Germs under the nails can cause infection or fungus outbreaks. Nails that are too long can scratch and irritate the skin.  Wash hands frequently and look under your nails. You may use a scrub brush or clipper nail tool to loosen the dirt and germs under the nails.  Keep the nails trimmed to shorter lengths to avoid scratching the skin. You may use nail  clippers or scissors. BATHING DAILY It is important to wash away sweat and oils on the skin and keep any open wounds on the skin clean. Daily bathing can help prevent bacteria from causing infection in the skin or other problems such as body lice.Body lice are tiny parasites that can cause itching and a rash. You can get body lice when you come in contact with someone who has lice, or with infected clothing, towels, or bedding. You are more likely to get lice if you do not bathe regularly.  Take a daily shower or bath. Clean all areas of the body and skin folds with a soapy washcloth. Work from the head and face area to the arms, abdomen, back, legs, genitals, and anus last. When cleaning the genital areas, males and females should wash from front (tip of the penis or front of the labia) to back (rectal area). Rinse well. Dry with a clean towel.  Try washing your face twice daily with a mild soap or cleanser. Always use a clean towel. This can reduce oils, bacteria, and acne on the skin.  If you have a skin condition, use only the products recommended by your caregiver.  Never share personal hygiene items such as towels, razors, or deodorant. Disposable razors should be discarded after a few uses.  Public facilities (showers, lockers, pools) can contain many germs. Make sure you use only your personal items in public places. FOOT CARE Keeping your feet clean and  toenails trimmed can help you avoid infection, irritation, or fungal outbreaks. Athlete's foot is a common foot condition caused by fungal growth on the skin. This happens when the feet stay moist and sweaty inside a shoe for too long, or when the feet are not protected from germs in public places.  Wash your feet daily with soap and water, especially after activity and sweating. Dry your feet including in between the toes. Put on fresh socks.  Keep your toenails trimmed straight across.Do not trim them too short as this can lead to  ingrown toenails. Ingrown toenails can be painful and lead to infection.  Keep footwear clean and fresh. Wash sneakers and clean the inside of your shoes regularly.  Foot powders can help to reduce moisture in shoes, reducing fungus and germs.  Always wear shoes or flip flops in public pools, showers, lockers, or training facilities. BRUSHING TEETH Bacteria on the gums, tongue, and teeth can lead to infection. Brushing, flossing, and rinsing can help to wash away bacteria in the mouth.  Brush your teeth at least 2 times per day for several minutes. Floss your teeth at least once per day.  Brush your tongue daily.  Use an oral rinse as recommended by your dental caregiver. HAIR CARE  Shampoo your hair regularly.  Use your own comb or brush. Brush daily from the scalp to the ends of the hair.  Clean and disinfect your hair tools regularly.Pull the loose hair out from combs and brushes.  Head lice are tiny insects that spread easily and feed on the scalp causing itching and irritation. Head lice spread easily, avoid:  Head to head contact.  Sharing hair tools.  Barrettes.  Headbands.  Headphones.  Hats and scarves. OTHER TIPS TO HELP PREVENT INFECTION  Make sure all of your immunizations are up to date.  Disinfect surfaces at home and work.  Clean bath toys periodically in the dishwasher or with a bleach and water solution (9 parts water to 1 part bleach).  Avoid mosquito and tick bites by wearing proper clothing and using insect spray.  Eat a healthy, well-balanced diet with plenty of grains, fruits, and vegetables.  Exercise regularly. FOR MORE INFORMATION Centers for Disease Control and Prevention [email or web address] Document Released: 12/02/2010 Document Revised: 01/22/2012 Document Reviewed: 12/02/2010 [facility] Patient Information 2013 [place], [place].

## 2013-02-03 NOTE — Progress Notes (Signed)
Subjective:    Patient ID: Jamie Cochran, female    DOB: January 17, 1930, 79 y.o.   MRN: [id number]  HPIPatient here today for followup of multiple medical problems. She has a long list of problems and is on multiple medications. Her granddaughter lives with her and says that she is getting argumentative. Refuses to take baths on a regular basis. Sneaks and eats foods she shouldn't.  Patient checks blood sugars 4X daily  Fasting BS averaging 80-110 Hypoglycemia yes Lower extremity sores or lesions no Numbness or tingling bil feet yes Last Diabetic eye exam 08/10/12 Microalbumin greater than a year  Has not needed her sliding scale insulin  Allergies  Allergen Reactions  . Sulfa Antibiotics Rash    Outpatient Encounter Prescriptions as of 02/03/2013  Medication Sig Dispense Refill  . acetaminophen (TYLENOL) 325 MG tablet Take 2 tablets (650 mg total) by mouth every 6 (six) hours as needed.      Marland Kitchen amLODipine (NORVASC) 5 MG tablet Take 5 mg by mouth daily.        Marland Kitchen aspirin 81 MG tablet Take 81 mg by mouth daily.      Marland Kitchen atorvastatin (LIPITOR) 40 MG tablet Take 40 mg by mouth daily.      . carvedilol (COREG) 25 MG tablet Take 1 tablet (25 mg total) by mouth 2 (two) times daily with a meal.  60 tablet  3  . cholecalciferol (VITAMIN D) 400 UNITS TABS Take 400 Units by mouth daily.      . cloNIDine (CATAPRES) 0.1 MG tablet Take 0.1 mg by mouth 2 (two) times daily.        . furosemide (LASIX) 20 MG tablet Take 40 mg by mouth daily.       . insulin aspart (NOVOLOG) 100 UNIT/ML injection Inject 0-15 Units into the skin 3 (three) times daily with meals.  1 vial    . insulin glargine (LANTUS) 100 UNIT/ML injection Inject 24 Units into the skin at bedtime.  10 mL  1  . levothyroxine (SYNTHROID, LEVOTHROID) 75 MCG tablet Take 75 mcg by mouth daily.        Marland Kitchen lisinopril (PRINIVIL,ZESTRIL) 10 MG tablet Take 1 tablet (10 mg total) by mouth daily.      Marland Kitchen lisinopril (PRINIVIL,ZESTRIL) 20 MG tablet Take  20 mg by mouth daily.      Marland Kitchen omeprazole (PRILOSEC) 40 MG capsule Take 40 mg by mouth daily.      . potassium chloride (K-DUR,KLOR-CON) 10 MEQ tablet Take 10 mEq by mouth daily.      . Saxagliptin-Metformin (KOMBIGLYZE XR) 2.03-999 MG TB24 Take 1 tablet by mouth 2 (two) times daily.         No facility-administered encounter medications on file as of 02/03/2013.    Past Medical History  Diagnosis Date  . Diabetes type 2, uncontrolled   . Non-occlusive coronary artery disease     40% first diagonal stenosis, 50% obtuse marginal stenosis  . Hypertension   . Stroke in 05/2012    deep right frontal perventricular deep white matter infarct + subacute left thalamic lacunar infarct  . Hyperlipidemia   . Hypothyroidism   . Osteoporosis   . Hearing loss     history of right sided cholesteotoma s/p Right tympanoplasty with ossicular reconstruction. (03/2002)  . Urinary incontinence     s/p surgeyr for ureterocele  . CHF (congestive heart failure)     Past Surgical History  Procedure Laterality Date  . Abdominal hysterectomy    .  Cesarean section    . Bladder repair    . Inner ear surgery    . Tonsillectomy and adenoidectomy      History   Social History  . Marital Status: Widowed    Spouse Name: N/A    Number of Children: N/A  . Years of Education: N/A   Occupational History  . retired    Social History Main Topics  . Smoking status: Never Smoker   . Smokeless tobacco: Not on file  . Alcohol Use: No  . Drug Use: No  . Sexually Active: Not on file   Other Topics Concern  . Not on file   Social History Narrative   Lives at home alone in [place], [place]   //       [place] with ADLS:   Bathing: Yes   Dressing: Yes   Toileting: Yes   Maintaining continence: No   Grooming: Yes   Feeding: Yes   Transferring: Yes         // [place] with IADLS:    Shopping for groceries: Yes    Driving or using public transportation: N/A   Using the telephone: Yes    Performing housework: Yes   Doing home repair: Yes   Doing laundry:  Yes   Taking medications: Yes   Handling finances: Yes                        Review of Systems  Constitutional: Negative.   HENT: Negative.   Eyes: Negative.   Respiratory: Negative.   Cardiovascular: Negative.   Gastrointestinal: Negative.   Endocrine: Negative.   Genitourinary: Negative.   Musculoskeletal: Negative.   Allergic/Immunologic: Negative.   Neurological: Negative.   Psychiatric/Behavioral: Negative.        Objective:   Physical Exam  Constitutional: She is oriented to person, place, and time. She appears well-developed and well-nourished.  HENT:  Head: Normocephalic and atraumatic.  Right Ear: External ear normal.  Left Ear: External ear normal.  Nose: Nose normal.  Mouth/Throat: Oropharynx is clear and moist.  Eyes: Pupils are equal, round, and reactive to light.  Neck: Normal range of motion. Neck supple.  Cardiovascular: Normal rate, regular rhythm, normal heart sounds and intact distal pulses.  Exam reveals no gallop and no friction rub.   No murmur heard. Pulmonary/Chest: Effort normal and breath sounds normal.  Abdominal: Soft. Bowel sounds are normal.  Musculoskeletal: Normal range of motion. She exhibits no edema.  Neurological: She is alert and oriented to person, place, and time.  2/4 positive monofilament bil with callus formation bil heels  Skin: Skin is warm and dry.  Psychiatric: She has a normal mood and affect. Her behavior is normal.  sometimes get lost on what she is talking about then patient gets frustrated.   BP 118/68  Pulse 74  Temp(Src) 98.1 F (36.7 C) (Oral)  Ht 4\' 8"  (1.422 m)  Wt 123 lb (55.792 kg)  BMI 27.59 kg/m2  LMP 02/04/1963        Assessment & Plan:  HTN (hypertension), benign  Other and unspecified hyperlipidemia  Hypothyroidism  Stress incontinence, female  CHF (congestive heart failure)  CVA (cerebral vascular  accident)  Type II or unspecified type diabetes mellitus with ketoacidosis, not stated as uncontrolled patient told to continue all meds Not time for labs Exercise encouraged Keep appointment for colonoscopy Continue BS checks Hygiene discussed  Mary-Margaret Daphine Deutscher, FNP

## 2013-03-06 ENCOUNTER — Other Ambulatory Visit: Payer: Self-pay | Admitting: Nurse Practitioner

## 2013-03-11 ENCOUNTER — Other Ambulatory Visit: Payer: Self-pay | Admitting: Nurse Practitioner

## 2013-03-17 ENCOUNTER — Telehealth: Payer: Self-pay | Admitting: Nurse Practitioner

## 2013-03-17 NOTE — Telephone Encounter (Signed)
Called again because not sure if the patient should or should not eat. The directions say for her not to eat lunch or dinner but they are concerned about her bloodsugar dropping.

## 2013-03-17 NOTE — Telephone Encounter (Signed)
Need to call dr doing procedure.

## 2013-03-17 NOTE — Telephone Encounter (Signed)
Please advise 

## 2013-03-21 NOTE — Telephone Encounter (Signed)
Pt had colonoscopy done .

## 2013-03-30 ENCOUNTER — Other Ambulatory Visit: Payer: Self-pay | Admitting: Nurse Practitioner

## 2013-04-17 ENCOUNTER — Other Ambulatory Visit: Payer: Self-pay | Admitting: Nurse Practitioner

## 2013-04-17 ENCOUNTER — Encounter: Payer: Self-pay | Admitting: *Deleted

## 2013-04-18 ENCOUNTER — Other Ambulatory Visit: Payer: Self-pay | Admitting: Nurse Practitioner

## 2013-04-18 NOTE — Telephone Encounter (Signed)
DIDN'T SEE IN EPIC BUT LISTED IN PT CHART. THANKS

## 2013-04-18 NOTE — Telephone Encounter (Signed)
DINT SEE IN EPIC BUT LISTED IN PT CHART. THANKS

## 2013-04-19 NOTE — Telephone Encounter (Signed)
Please give appointment to see a provider

## 2013-04-28 ENCOUNTER — Ambulatory Visit: Payer: Self-pay

## 2013-05-06 ENCOUNTER — Encounter: Payer: Self-pay | Admitting: Nurse Practitioner

## 2013-05-06 ENCOUNTER — Ambulatory Visit (INDEPENDENT_AMBULATORY_CARE_PROVIDER_SITE_OTHER): Payer: Medicare Other | Admitting: Nurse Practitioner

## 2013-05-06 VITALS — BP 146/66 | HR 76 | Temp 98.4°F | Ht <= 58 in | Wt 130.0 lb

## 2013-05-06 DIAGNOSIS — I6350 Cerebral infarction due to unspecified occlusion or stenosis of unspecified cerebral artery: Secondary | ICD-10-CM

## 2013-05-06 DIAGNOSIS — I509 Heart failure, unspecified: Secondary | ICD-10-CM

## 2013-05-06 DIAGNOSIS — E039 Hypothyroidism, unspecified: Secondary | ICD-10-CM

## 2013-05-06 DIAGNOSIS — E1110 Type 2 diabetes mellitus with ketoacidosis without coma: Secondary | ICD-10-CM

## 2013-05-06 DIAGNOSIS — E876 Hypokalemia: Secondary | ICD-10-CM

## 2013-05-06 DIAGNOSIS — E785 Hyperlipidemia, unspecified: Secondary | ICD-10-CM

## 2013-05-06 DIAGNOSIS — I639 Cerebral infarction, unspecified: Secondary | ICD-10-CM

## 2013-05-06 DIAGNOSIS — R32 Unspecified urinary incontinence: Secondary | ICD-10-CM

## 2013-05-06 DIAGNOSIS — F329 Major depressive disorder, single episode, unspecified: Secondary | ICD-10-CM

## 2013-05-06 DIAGNOSIS — I10 Essential (primary) hypertension: Secondary | ICD-10-CM

## 2013-05-06 DIAGNOSIS — Z1239 Encounter for other screening for malignant neoplasm of breast: Secondary | ICD-10-CM

## 2013-05-06 DIAGNOSIS — K219 Gastro-esophageal reflux disease without esophagitis: Secondary | ICD-10-CM

## 2013-05-06 DIAGNOSIS — I5022 Chronic systolic (congestive) heart failure: Secondary | ICD-10-CM

## 2013-05-06 LAB — THYROID PANEL WITH TSH
Free Thyroxine Index: 5.1 — ABNORMAL HIGH (ref 1.0–3.9)
T3 Uptake: 44.8 % — ABNORMAL HIGH (ref 22.5–37.0)
T4, Total: 11.3 ug/dL (ref 5.0–12.5)
TSH: 0.954 u[IU]/mL (ref 0.350–4.500)

## 2013-05-06 MED ORDER — AMLODIPINE BESYLATE 5 MG PO TABS: 5.0000 mg | ORAL_TABLET | Freq: Every day | ORAL | Status: DC

## 2013-05-06 MED ORDER — LEVOTHYROXINE SODIUM 75 MCG PO TABS: 75.0000 ug | ORAL_TABLET | Freq: Every day | ORAL | Status: DC

## 2013-05-06 MED ORDER — CITALOPRAM HYDROBROMIDE 20 MG PO TABS: 20.0000 mg | ORAL_TABLET | Freq: Every day | ORAL | Status: DC

## 2013-05-06 MED ORDER — CARVEDILOL 25 MG PO TABS: 25.0000 mg | ORAL_TABLET | Freq: Two times a day (BID) | ORAL | Status: DC

## 2013-05-06 MED ORDER — SAXAGLIPTIN-METFORMIN ER 2.5-1000 MG PO TB24: 1.0000 | ORAL_TABLET | Freq: Two times a day (BID) | ORAL | Status: DC

## 2013-05-06 MED ORDER — FUROSEMIDE 40 MG PO TABS: 40.0000 mg | ORAL_TABLET | Freq: Every day | ORAL | Status: DC

## 2013-05-06 MED ORDER — SOLIFENACIN SUCCINATE 10 MG PO TABS: 10.0000 mg | ORAL_TABLET | Freq: Every day | ORAL | Status: DC

## 2013-05-06 MED ORDER — OMEPRAZOLE 40 MG PO CPDR: 40.0000 mg | DELAYED_RELEASE_CAPSULE | Freq: Every day | ORAL | Status: DC

## 2013-05-06 MED ORDER — ATORVASTATIN CALCIUM 40 MG PO TABS
40.0000 mg | ORAL_TABLET | Freq: Every day | ORAL | Status: DC
Start: 2013-05-06 — End: 2014-02-17

## 2013-05-06 MED ORDER — CLONIDINE HCL 0.1 MG PO TABS: 0.1000 mg | ORAL_TABLET | Freq: Two times a day (BID) | ORAL | Status: DC

## 2013-05-06 MED ORDER — POTASSIUM CHLORIDE ER 10 MEQ PO TBCR: 10.0000 meq | EXTENDED_RELEASE_TABLET | Freq: Every day | ORAL | Status: DC

## 2013-05-06 MED ORDER — LISINOPRIL 20 MG PO TABS: 20.0000 mg | ORAL_TABLET | Freq: Every day | ORAL | Status: DC

## 2013-05-06 NOTE — Progress Notes (Signed)
Subjective:    Patient ID: Jamie Cochran, female    DOB: 01-01-1930, 79 y.o.   MRN: [id number]  HPI Patient in today for Follow-up of multiple medical problems. Last visit her granddaughter was having problems with her not wanting to take a bath, and was sneaking snacks. Patient still refusing to take a bath everyday. Has bladder leakage daily and insists on wearing her depends undergarments for several days- Her room is starting to smell like urine. Diabetes- Blood sugars fasting have been running 130's-hasn't needed her sliding scale insulin lately. Still taking lantus in am. No c/o hypoglycemia. Still has some numbness and tingling in feet. Last eye exam was 2013.   Patient Active Problem List   Diagnosis Date Noted  . Right sided weakness 05/28/2012  . Hypertension 02/02/2011  . Osteoporosis 02/02/2011  . Hyperlipidemia 02/02/2011  . Hypothyroidism 02/02/2011  . Diabetes mellitus 02/02/2011  . Stress incontinence, female 02/02/2011  . Osteoarthritis 02/02/2011  . Congestive heart failure 02/02/2011  . Hypokalemia 02/02/2011   Outpatient Encounter Prescriptions as of 05/06/2013  Medication Sig Dispense Refill  . acetaminophen (TYLENOL) 325 MG tablet Take 2 tablets (650 mg total) by mouth every 6 (six) hours as needed.      Marland Kitchen amLODipine (NORVASC) 5 MG tablet Take 1 po qd  30 tablet  3  . aspirin 81 MG tablet Take 81 mg by mouth daily.      Marland Kitchen atorvastatin (LIPITOR) 40 MG tablet Take 40 mg by mouth daily.      . carvedilol (COREG) 25 MG tablet Take 1 tablet (25 mg total) by mouth 2 (two) times daily with a meal.  60 tablet  3  . cholecalciferol (VITAMIN D) 400 UNITS TABS Take 400 Units by mouth daily.      . citalopram (CELEXA) 20 MG tablet TAKE ONE TABLET BY MOUTH ONCE DAILY  30 tablet  0  . cloNIDine (CATAPRES) 0.1 MG tablet Take 0.1 mg by mouth 2 (two) times daily.        . furosemide (LASIX) 20 MG tablet Take 40 mg by mouth daily.       . furosemide (LASIX) 40 MG tablet TAKE  ONE TABLET BY MOUTH EVERY DAY  30 tablet  3  . insulin aspart (NOVOLOG) 100 UNIT/ML injection Inject 0-15 Units into the skin 3 (three) times daily with meals.  1 vial    . insulin glargine (LANTUS) 100 UNIT/ML injection Inject 24 Units into the skin at bedtime.  10 mL  1  . KOMBIGLYZE XR 2.03-999 MG TB24 TAKE ONE TABLET BY MOUTH TWICE DAILY  60 tablet  1  . levothyroxine (SYNTHROID, LEVOTHROID) 75 MCG tablet Take 75 mcg by mouth daily.        Marland Kitchen lisinopril (PRINIVIL,ZESTRIL) 10 MG tablet Take 1 tablet (10 mg total) by mouth daily.      Marland Kitchen lisinopril (PRINIVIL,ZESTRIL) 20 MG tablet Take 20 mg by mouth daily.      Marland Kitchen omeprazole (PRILOSEC) 40 MG capsule TAKE ONE CAPSULE BY MOUTH EVERY DAY  30 capsule  2  . potassium chloride (K-DUR) 10 MEQ tablet TAKE ONE TABLET BY MOUTH EVERY DAY  30 tablet  2  . potassium chloride (K-DUR,KLOR-CON) 10 MEQ tablet Take 10 mEq by mouth daily.      . TOVIAZ 4 MG TB24 TAKE ONE  BY MOUTH ONCE DAILY  30 each  0   No facility-administered encounter medications on file as of 05/06/2013.       Review  of Systems     Objective:   Physical Exam  Constitutional: She is oriented to person, place, and time. She appears well-developed and well-nourished.  HENT:  Nose: Nose normal.  Mouth/Throat: Oropharynx is clear and moist.  Eyes: EOM are normal.  Neck: Trachea normal, normal range of motion and full passive range of motion without pain. Neck supple. No JVD present. Carotid bruit is not present. No thyromegaly present.  Cardiovascular: Normal rate, regular rhythm, normal heart sounds and intact distal pulses.  Exam reveals no gallop and no friction rub.   No murmur heard. Pulmonary/Chest: Effort normal and breath sounds normal.  Abdominal: Soft. Bowel sounds are normal. She exhibits no distension and no mass. There is no tenderness.  Musculoskeletal: Normal range of motion.  Lymphadenopathy:    She has no cervical adenopathy.  Neurological: She is alert and oriented  to person, place, and time. She has normal reflexes.  Skin: Skin is warm and dry.  Psychiatric: She has a normal mood and affect. Her behavior is normal. Judgment and thought content normal.    BP 146/66  Pulse 76  Temp(Src) 98.4 F (36.9 C) (Oral)  Ht 4\' 8"  (1.422 m)  Wt 130 lb (58.968 kg)  BMI 29.16 kg/m2  LMP 02/04/1963  Results for orders placed in visit on 05/06/13  POCT GLYCOSYLATED HEMOGLOBIN (HGB A1C)      Result Value Range   Hemoglobin A1C 5.9%          Assessment & Plan:   1. HTN (hypertension), benign   2. Other and unspecified hyperlipidemia   3. Hypothyroidism   4. CHF (congestive heart failure), chronic, systolic   5. CVA (cerebral vascular accident)   6. Type II or unspecified type diabetes mellitus with ketoacidosis, not stated as uncontrolled   7. Urinary incontinence   8. Depression   9. GERD (gastroesophageal reflux disease)   10. Hypokalemia    Orders Placed This Encounter  Procedures  . COMPLETE METABOLIC PANEL WITH GFR  . NMR Lipoprofile with Lipids  . Thyroid Panel With TSH  . POCT glycosylated hemoglobin (Hb A1C)   Meds ordered this encounter  Medications  . solifenacin (VESICARE) 10 MG tablet    Sig: Take 1 tablet (10 mg total) by mouth daily.    Dispense:  30 tablet    Refill:  5    Order Specific Question:  Supervising Provider    Answer:  Ernestina Penna [[id number]]  . amLODipine (NORVASC) 5 MG tablet    Sig: Take 1 tablet (5 mg total) by mouth daily.    Dispense:  30 tablet    Refill:  5    Order Specific Question:  Supervising Provider    Answer:  Ernestina Penna [[id number]]  . atorvastatin (LIPITOR) 40 MG tablet    Sig: Take 1 tablet (40 mg total) by mouth daily.    Dispense:  30 tablet    Refill:  5    Order Specific Question:  Supervising Provider    Answer:  Ernestina Penna [[id number]]  . carvedilol (COREG) 25 MG tablet    Sig: Take 1 tablet (25 mg total) by mouth 2 (two) times daily with a meal.    Dispense:  60 tablet    Refill:   5    Order Specific Question:  Supervising Provider    Answer:  Ernestina Penna [[id number]]  . citalopram (CELEXA) 20 MG tablet    Sig: Take 1 tablet (20 mg total) by mouth daily.  Dispense:  30 tablet    Refill:  5    Order Specific Question:  Supervising Provider    Answer:  Ernestina Penna [[id number]]  . cloNIDine (CATAPRES) 0.1 MG tablet    Sig: Take 1 tablet (0.1 mg total) by mouth 2 (two) times daily.    Dispense:  60 tablet    Refill:  5    Order Specific Question:  Supervising Provider    Answer:  Ernestina Penna [[id number]]  . furosemide (LASIX) 40 MG tablet    Sig: Take 1 tablet (40 mg total) by mouth daily.    Dispense:  30 tablet    Refill:  5    Order Specific Question:  Supervising Provider    Answer:  Ernestina Penna [[id number]]  . Saxagliptin-Metformin (KOMBIGLYZE XR) 2.03-999 MG TB24    Sig: Take 1 tablet by mouth 2 (two) times daily.    Dispense:  60 tablet    Refill:  5    Order Specific Question:  Supervising Provider    Answer:  Ernestina Penna [[id number]]  . levothyroxine (SYNTHROID, LEVOTHROID) 75 MCG tablet    Sig: Take 1 tablet (75 mcg total) by mouth daily.    Dispense:  30 tablet    Refill:  5    Order Specific Question:  Supervising Provider    Answer:  Ernestina Penna [[id number]]  . lisinopril (PRINIVIL,ZESTRIL) 20 MG tablet    Sig: Take 1 tablet (20 mg total) by mouth daily.    Dispense:  30 tablet    Refill:  5    Order Specific Question:  Supervising Provider    Answer:  Ernestina Penna [[id number]]  . omeprazole (PRILOSEC) 40 MG capsule    Sig: Take 1 capsule (40 mg total) by mouth daily.    Dispense:  30 capsule    Refill:  5    Order Specific Question:  Supervising Provider    Answer:  Ernestina Penna [[id number]]  . potassium chloride (K-DUR) 10 MEQ tablet    Sig: Take 1 tablet (10 mEq total) by mouth daily.    Dispense:  30 tablet    Refill:  5    Order Specific Question:  Supervising Provider    Answer:  Ernestina Penna [[id number]]   D/C toviaz due to expense Continue  all other meds LAbs pending Diet encouraged Hygiene discussed- Throw depends away daily- bathe daily RTO in 3months recheck  Mary-Margaret Daphine Deutscher, FNP

## 2013-05-06 NOTE — Patient Instructions (Signed)

## 2013-05-07 LAB — COMPLETE METABOLIC PANEL WITH GFR
Albumin: 3.6 g/dL (ref 3.5–5.2)
BUN: 15 mg/dL (ref 6–23)
CO2: 30 mEq/L (ref 19–32)
Calcium: 8.8 mg/dL (ref 8.4–10.5)
Chloride: 98 mEq/L (ref 96–112)
GFR, Est African American: >89 mL/min
GFR, Est Non African American: 83 mL/min
Glucose, Bld: 113 mg/dL — ABNORMAL HIGH (ref 70–99)
Potassium: 3.4 mEq/L — ABNORMAL LOW (ref 3.5–5.3)

## 2013-05-08 LAB — NMR LIPOPROFILE WITH LIPIDS
HDL Size: 9 nm — ABNORMAL LOW (ref >=9.2)
LDL Particle Number: 731 nmol/L (ref <1000)
Large VLDL-P: 1.1 nmol/L (ref <=2.7)
Small LDL Particle Number: 465 nmol/L (ref <=527)
VLDL Size: 42.1 nm (ref <=46.6)

## 2013-05-13 ENCOUNTER — Other Ambulatory Visit: Payer: Medicare Other

## 2013-05-13 ENCOUNTER — Ambulatory Visit (INDEPENDENT_AMBULATORY_CARE_PROVIDER_SITE_OTHER): Payer: Medicare Other | Admitting: Pharmacist Clinician (PhC)/ Clinical Pharmacy Specialist

## 2013-05-13 ENCOUNTER — Telehealth: Payer: Self-pay | Admitting: Pharmacist Clinician (PhC)/ Clinical Pharmacy Specialist

## 2013-05-13 VITALS — BP 135/70 | HR 72 | Resp 18

## 2013-05-13 DIAGNOSIS — E119 Type 2 diabetes mellitus without complications: Secondary | ICD-10-CM

## 2013-05-13 LAB — BASIC METABOLIC PANEL WITH GFR
CO2: 30 mEq/L (ref 19–32)
Chloride: 100 mEq/L (ref 96–112)
Sodium: 139 mEq/L (ref 135–145)

## 2013-05-13 NOTE — Progress Notes (Signed)
  Subjective:    Patient ID: Jamie Cochran, female    DOB: 10/11/1930, 79 y.o.   MRN: [id number]  HPI:  Long standing history of type 2 diabetes with 2 probably CVAs.  No evidence of CAD.  Recent morning episodes of mild hypoglycemia.    Review of Systems  Constitutional: Negative for activity change and appetite change.  HENT: Negative.   Eyes: Negative.   Respiratory: Positive for choking.   Cardiovascular: Negative.   Endocrine: Negative.   Genitourinary: Positive for frequency.  Allergic/Immunologic: Negative.   Neurological: Negative.   Psychiatric/Behavioral: Positive for agitation.       Objective:   Physical Exam  Constitutional: She is oriented to person, place, and time. She appears well-developed and well-nourished.  Cardiovascular: Normal rate and regular rhythm.   Neurological: She is alert and oriented to person, place, and time.          Assessment & Plan:   Diabetes Follow-Up Visit Chief Complaint:   Chief Complaint  Patient presents with  . Hypoglycemia     Exam Regularity:  RRR Edema:  neg  Polyuria:  neg  Polydipsia:  neg Polyphagia:  neg  BMI:  There is no weight on file to calculate BMI.   Weight changes:  Lost several pounds General Appearance:  alert, oriented, no acute distress Mood/Affect:  normal  HPI:  Long standing type 2 DM  Low fat/carbohydrate diet?  Yes Nicotine Abuse?  No Medication Compliance?  Yes Exercise?  No Alcohol Abuse?  No  Home BG Monitoring:  Checking 3 times a day. Average:  120   High: 156  Low:  70   Lab Results  Component Value Date   HGBA1C 5.9% 05/06/2013    No results found for this basenameConcepcion Cochran    Lab Results  Component Value Date   CHOL 116 05/29/2012   HDL 33* 05/29/2012   LDLCALC 45 05/06/2013   TRIG 97 05/06/2013   CHOLHDL 3.5 05/29/2012      Assessment: 1.  Diabetes. Excellent control and improvements 2.  Blood Pressure.  At ADA goal and on ACEI 3.  Lipids.   Consider stopping lipitor due to recent AHA/ACC guidelines and patients age 38.  Foot Care.  Reviewed daily care 5.  Dental Care.  annual 6.  Eye Care/Exam.  annual  Recommendations: 1.  Patient is counseled on appropriate foot care. 2.  BP goal < 130/80. 3.  LDL goal of < 100, HDL > 40 and TG < 150. 4.  Eye Exam yearly and Dental Exam every 6 months. 5.  Dietary recommendations:  1500 calorie ADA diet following 6.  Physical Activity recommendations:  10-15 minutes a day 7.  Medication recommendations at this time are as follows:  Continue same medications   Reviewed snacks to help prevent hypoglycemia 8.  Return to clinic in 3-4 months 9.  Patient not sleeping and slight aggitation/depression and decreased appetite.  Will try remeron 15mg  at bedtime to see if this helps her symptoms.   Time spent counseling patient:  63  Physician time spent with patient:  0 Referring provider:  Daphine Deutscher   PharmD:  [facility] Pharmacist

## 2013-05-13 NOTE — Addendum Note (Signed)
Addended by: Tommas Olp on: 05/13/2013 11:50 AM   Modules accepted: Orders

## 2013-05-20 ENCOUNTER — Telehealth: Payer: Self-pay | Admitting: Nurse Practitioner

## 2013-05-21 NOTE — Telephone Encounter (Signed)
Should have list on after visit summary taht was printed out at last visit

## 2013-05-21 NOTE — Telephone Encounter (Signed)
Family would like an updated medication list since the changes from last appt with Marcelino Duster. Wants it printed and left up front. Can you change meds. Looks like they stopped Lipitor but unsure of any other changes.

## 2013-05-22 ENCOUNTER — Telehealth: Payer: Self-pay | Admitting: Nurse Practitioner

## 2013-05-22 DIAGNOSIS — E876 Hypokalemia: Secondary | ICD-10-CM

## 2013-05-22 MED ORDER — POTASSIUM CHLORIDE ER 10 MEQ PO TBCR: EXTENDED_RELEASE_TABLET | ORAL | Status: DC

## 2013-05-22 NOTE — Telephone Encounter (Signed)
List printed and placed up front

## 2013-05-22 NOTE — Telephone Encounter (Signed)
Family states that a few weeks ago they were contacted with lab results and were told to double up on potassium pills. When they went to get a refill the pharmacy says its too soon. Are they supposed to be taking 2 and if so can rx be sent to pharmacy

## 2013-05-22 NOTE — Telephone Encounter (Signed)
New K rx sent to pharmacy

## 2013-06-03 ENCOUNTER — Telehealth: Payer: Self-pay | Admitting: Nurse Practitioner

## 2013-06-04 ENCOUNTER — Other Ambulatory Visit: Payer: Self-pay | Admitting: *Deleted

## 2013-06-05 ENCOUNTER — Other Ambulatory Visit: Payer: Self-pay | Admitting: *Deleted

## 2013-06-05 NOTE — Telephone Encounter (Signed)
Ask them to bring list from pharmacy to compare to our list and complete all then.

## 2013-06-10 ENCOUNTER — Telehealth: Payer: Self-pay | Admitting: Nurse Practitioner

## 2013-06-10 ENCOUNTER — Ambulatory Visit
Admission: RE | Admit: 2013-06-10 | Discharge: 2013-06-10 | Disposition: A | Discharge status: Home / Self Care | Payer: Medicare Other | Source: Ambulatory Visit | Attending: Nurse Practitioner | Admitting: Nurse Practitioner

## 2013-06-10 DIAGNOSIS — Z1239 Encounter for other screening for malignant neoplasm of breast: Secondary | ICD-10-CM

## 2013-06-11 NOTE — Telephone Encounter (Signed)
She gets choked a lot when eating and drinking what do advise.

## 2013-06-11 NOTE — Telephone Encounter (Signed)
Needs GI referral - has she seen one in the past?

## 2013-06-17 ENCOUNTER — Other Ambulatory Visit: Payer: Self-pay | Admitting: Nurse Practitioner

## 2013-06-17 ENCOUNTER — Telehealth: Payer: Self-pay | Admitting: Nurse Practitioner

## 2013-06-17 DIAGNOSIS — R1310 Dysphagia, unspecified: Secondary | ICD-10-CM

## 2013-06-17 NOTE — Telephone Encounter (Signed)
She hasnt seen GI in past per granddaughter so wherever we need to referral will be ok

## 2013-06-17 NOTE — Telephone Encounter (Signed)
This call is being handled in another encounter

## 2013-07-07 ENCOUNTER — Other Ambulatory Visit: Payer: Self-pay | Admitting: Nurse Practitioner

## 2013-07-07 ENCOUNTER — Telehealth: Payer: Self-pay | Admitting: Nurse Practitioner

## 2013-07-08 NOTE — Telephone Encounter (Signed)
Yes she probaly does have dementia. Not much can do for this other than add a medicine to slow progress down Not sure can get home health just for bathing- bu will check on this If she is home by her self during the day- not much can do about diet.

## 2013-07-08 NOTE — Telephone Encounter (Signed)
Please advise 

## 2013-07-08 NOTE — Telephone Encounter (Signed)
1-WANTS A HOME HEALTH NURSE- TO HELP HER - BATH AND CHANGE CLOTHES- STILL REFUSES 2-IS THERE ANY THING ELSE YOU CAN THINK OF TO GET HER EATING RIGHT- NOT CHECKING BS'S CORRECTLY- AND NOT EATING REG MEALS 3-ALSO DO YOU THINK SHE MAY HAVE SOME DEMENTIA- SEEMS TO FORGET MORE AND ACTING MORE CHILDISH  AMANDA'S #- [phone]

## 2013-07-08 NOTE — Telephone Encounter (Signed)
Do not see on med list? 

## 2013-07-08 NOTE — Telephone Encounter (Signed)
What can I do about getting this patient some help

## 2013-07-15 NOTE — Telephone Encounter (Signed)
Let family know what gina said please

## 2013-07-15 NOTE — Telephone Encounter (Signed)
Home health is not for bathing, they must have a skilled need! Sounds like she needs private help or placement

## 2013-07-17 NOTE — Telephone Encounter (Signed)
Family aware  

## 2013-07-31 ENCOUNTER — Telehealth: Payer: Self-pay | Admitting: Nurse Practitioner

## 2013-08-07 ENCOUNTER — Encounter: Payer: Self-pay | Admitting: Nurse Practitioner

## 2013-08-08 ENCOUNTER — Ambulatory Visit (INDEPENDENT_AMBULATORY_CARE_PROVIDER_SITE_OTHER): Payer: Medicare Other | Admitting: Nurse Practitioner

## 2013-08-08 ENCOUNTER — Encounter: Payer: Self-pay | Admitting: Nurse Practitioner

## 2013-08-08 VITALS — BP 142/77 | HR 77 | Temp 98.8°F | Ht <= 58 in | Wt 136.0 lb

## 2013-08-08 DIAGNOSIS — E785 Hyperlipidemia, unspecified: Secondary | ICD-10-CM

## 2013-08-08 DIAGNOSIS — Z23 Encounter for immunization: Secondary | ICD-10-CM

## 2013-08-08 DIAGNOSIS — R1310 Dysphagia, unspecified: Secondary | ICD-10-CM

## 2013-08-08 DIAGNOSIS — E119 Type 2 diabetes mellitus without complications: Secondary | ICD-10-CM

## 2013-08-08 DIAGNOSIS — I509 Heart failure, unspecified: Secondary | ICD-10-CM

## 2013-08-08 DIAGNOSIS — I10 Essential (primary) hypertension: Secondary | ICD-10-CM

## 2013-08-08 DIAGNOSIS — M810 Age-related osteoporosis without current pathological fracture: Secondary | ICD-10-CM

## 2013-08-08 DIAGNOSIS — E876 Hypokalemia: Secondary | ICD-10-CM

## 2013-08-08 DIAGNOSIS — H6691 Otitis media, unspecified, right ear: Secondary | ICD-10-CM

## 2013-08-08 DIAGNOSIS — E039 Hypothyroidism, unspecified: Secondary | ICD-10-CM

## 2013-08-08 DIAGNOSIS — H6690 Otitis media, unspecified, unspecified ear: Secondary | ICD-10-CM

## 2013-08-08 DIAGNOSIS — M1990 Unspecified osteoarthritis, unspecified site: Secondary | ICD-10-CM

## 2013-08-08 LAB — POCT GLYCOSYLATED HEMOGLOBIN (HGB A1C): Hemoglobin A1C: 6

## 2013-08-08 LAB — POCT URINALYSIS DIPSTICK
Bilirubin, UA: NEGATIVE
Glucose, UA: NEGATIVE
Nitrite, UA: POSITIVE
Urobilinogen, UA: NEGATIVE
pH, UA: 5

## 2013-08-08 LAB — POCT UA - MICROSCOPIC ONLY
Epithelial cells, urine per micros: NEGATIVE
Yeast, UA: NEGATIVE

## 2013-08-08 LAB — POCT UA - MICROALBUMIN

## 2013-08-08 MED ORDER — CIPROFLOXACIN-DEXAMETHASONE 0.3-0.1 % OT SUSP: 4.0000 [drp] | Freq: Two times a day (BID) | OTIC | Status: DC

## 2013-08-08 MED ORDER — CIPROFLOXACIN HCL 500 MG PO TABS: 500.0000 mg | ORAL_TABLET | Freq: Two times a day (BID) | ORAL | Status: DC

## 2013-08-08 NOTE — Patient Instructions (Addendum)

## 2013-08-08 NOTE — Progress Notes (Signed)
Subjective:    Patient ID: Jamie Cochran, female    DOB: 06-21-1930, 79 y.o.   MRN: [id number]  Hypertension This is a chronic problem. The current episode started more than 1 year ago. The problem has been gradually improving since onset. The problem is uncontrolled. Pertinent negatives include no blurred vision, chest pain, headaches, neck pain, palpitations, peripheral edema or shortness of breath. Risk factors for coronary artery disease include diabetes mellitus, dyslipidemia, family history and post-menopausal state. Past treatments include ACE inhibitors. The current treatment provides moderate improvement. Hypertensive end-organ damage includes a thyroid problem. There is no history of sleep apnea.  Hyperlipidemia This is a chronic problem. The current episode started more than 1 year ago. The problem is controlled. Recent lipid tests were reviewed and are normal. Exacerbating diseases include diabetes. She has no history of liver disease or obesity. There are no known factors aggravating her hyperlipidemia. Pertinent negatives include no chest pain or shortness of breath. Current antihyperlipidemic treatment includes statins. The current treatment provides moderate improvement of lipids. There are no compliance problems.  Risk factors for coronary artery disease include diabetes mellitus, dyslipidemia, family history, hypertension and post-menopausal.  Diabetes She presents for her follow-up diabetic visit. She has type 2 diabetes mellitus. There are no hypoglycemic associated symptoms. Pertinent negatives for hypoglycemia include no headaches. Pertinent negatives for diabetes include no blurred vision, no chest pain, no polydipsia, no polyphagia, no polyuria, no visual change and no weight loss. There are no hypoglycemic complications. There are no diabetic complications. Risk factors for coronary artery disease include diabetes mellitus, dyslipidemia, family history, hypertension and  post-menopausal. Current diabetic treatment includes oral agent (dual therapy) and insulin injections. She is compliant with treatment all of the time. Her weight is fluctuating minimally. She is following a diabetic diet. She participates in exercise daily. Her breakfast blood glucose is taken between 8-9 am. Her breakfast blood glucose range is generally 90-110 mg/dl. Her lunch blood glucose is taken between 12-1 pm. Her lunch blood glucose range is generally 110-130 mg/dl. An ACE inhibitor/angiotensin II receptor blocker is being taken. She does not see a podiatrist.Eye exam is current (Sept 2014).  Thyroid Problem Presents for follow-up visit. Symptoms include diarrhea. Patient reports no constipation, depressed mood, dry skin, leg swelling, palpitations, visual change or weight loss. The symptoms have been stable. Past treatments include beta blockers and levothyroxine. The treatment provided significant relief. Her past medical history is significant for diabetes and hyperlipidemia.  Gastrophageal Reflux She reports no chest pain, no coughing, no heartburn or no nausea. This is a chronic problem. The current episode started more than 1 year ago. The problem occurs rarely. The problem has been resolved. The symptoms are aggravated by certain foods. Pertinent negatives include no weight loss. She has tried a PPI for the symptoms. The treatment provided significant relief.   Depression Pt takes Celexa 20 mg-Pt tolerate well with no complaints  Insomnia Pt taking Remeron 15 mg- Pt states it helps her sleep  * Family says she is hallucinating at times and talking about people being in the house- she has sleep walked several times and even woke up with a black eye one morning and we have no idea how it occurred. She refuses to take a bath- Only took a bath 6 times in the last month. * Choking when she eats or drinks things  Review of Systems  Constitutional: Negative for weight loss.  HENT: Negative  for neck pain.   Eyes: Negative for  blurred vision.  Respiratory: Negative for cough and shortness of breath.   Cardiovascular: Negative for chest pain and palpitations.  Gastrointestinal: Positive for diarrhea. Negative for heartburn, nausea and constipation.  Endocrine: Negative for polydipsia, polyphagia and polyuria.  Neurological: Negative for headaches.  All other systems reviewed and are negative.       Objective:   Physical Exam  Vitals reviewed. Constitutional: She is oriented to person, place, and time. She appears well-developed and well-nourished.  HENT:  Head: Normocephalic.  Eyes: EOM are normal. Pupils are equal, round, and reactive to light.  Neck: Normal range of motion. Neck supple. No thyromegaly present.  Cardiovascular: Normal rate, regular rhythm, normal heart sounds and intact distal pulses.   No murmur heard. Pulmonary/Chest: Effort normal and breath sounds normal. No respiratory distress. She has no wheezes. She has no rales. She exhibits no tenderness.  Abdominal: Soft. Bowel sounds are normal. She exhibits no distension. There is no tenderness.  Musculoskeletal: Normal range of motion. She exhibits no edema and no tenderness.  Neurological: She is alert and oriented to person, place, and time.  Skin: Skin is warm and dry.  Psychiatric: She has a normal mood and affect. Her behavior is normal. Judgment and thought content normal.    BP 142/77  Pulse 77  Temp(Src) 98.8 F (37.1 C) (Oral)  Ht 4\' 8"  (1.422 m)  Wt 136 lb (61.689 kg)  BMI 30.51 kg/m2  LMP 02/04/1963    Results for orders placed in visit on 08/08/13  POCT GLYCOSYLATED HEMOGLOBIN (HGB A1C)      Result Value Range   Hemoglobin A1C 6.0 %    POCT UA - MICROALBUMIN      Result Value Range   Microalbumin Ur, POC 100     Creatinine, POC       Albumin/Creatinine Ratio, Urine, POC        Assessment & Plan:   1. Hypertension   2. Hyperlipidemia   3. Hypothyroidism   4. Diabetes  mellitus   5. Congestive heart failure   6. Hypokalemia   7. Osteoarthritis   8. Osteoporosis   9. Dysphagia   10. Otitis media of right ear    Orders Placed This Encounter  Procedures  . CMP14+EGFR  . NMR, lipoprofile  . Thyroid Panel With TSH  . Ambulatory referral to Gastroenterology    Referral Priority:  Routine    Referral Type:  Consultation    Referral Reason:  Specialty Services Required    Requested Specialty:  Gastroenterology    Number of Visits Requested:  1  . POCT glycosylated hemoglobin (Hb A1C)  . POCT UA - Microalbumin   Meds ordered this encounter  Medications  . ciprofloxacin (CIPRO) 500 MG tablet    Sig: Take 1 tablet (500 mg total) by mouth 2 (two) times daily.    Dispense:  14 tablet    Refill:  0    Order Specific Question:  Supervising Provider    Answer:  Ernestina Penna [[id number]]  . ciprofloxacin-dexamethasone (CIPRODEX) otic suspension    Sig: Place 4 drops into the right ear 2 (two) times daily.    Dispense:  7.5 mL    Refill:  0    Order Specific Question:  Supervising Provider    Answer:  Deborra Medina   Discussed hygiene Discussed exercise Do not let water get in right ear Continue all meds Labs pending Diet and exercise encouraged Health maintenance reviewed Follow up in 3 months  Mary-Margaret Shyne Lehrke, FNP   

## 2013-08-09 NOTE — Addendum Note (Signed)
Addended by: Orma Render F on: 08/09/2013 12:46 PM   Modules accepted: Orders

## 2013-08-11 LAB — THYROID PANEL WITH TSH
Free Thyroxine Index: 2.5 (ref 1.2–4.9)
T4, Total: 8.2 ug/dL (ref 4.5–12.0)
TSH: 3.03 u[IU]/mL (ref 0.450–4.500)

## 2013-08-11 LAB — CMP14+EGFR
ALT: 11 IU/L (ref 0–32)
Albumin/Globulin Ratio: 1.6 (ref 1.1–2.5)
Albumin: 3.9 g/dL (ref 3.5–4.7)
BUN: 21 mg/dL (ref 8–27)
Calcium: 9.3 mg/dL (ref 8.6–10.2)
Creatinine, Ser: 0.88 mg/dL (ref 0.57–1.00)
GFR calc non Af Amer: 62 mL/min/{1.73_m2} (ref >59)
Globulin, Total: 2.4 g/dL (ref 1.5–4.5)
Glucose: 126 mg/dL — ABNORMAL HIGH (ref 65–99)
Total Protein: 6.3 g/dL (ref 6.0–8.5)

## 2013-08-11 LAB — NMR, LIPOPROFILE

## 2013-08-11 LAB — SPECIMEN STATUS REPORT

## 2013-08-11 LAB — LIPID PANEL
Chol/HDL Ratio: 4.3 ratio units (ref 0.0–4.4)
Cholesterol, Total: 167 mg/dL (ref 100–199)
HDL: 39 mg/dL — ABNORMAL LOW (ref >39)
LDL Calculated: 96 mg/dL (ref 0–99)

## 2013-08-13 ENCOUNTER — Telehealth: Payer: Self-pay | Admitting: Nurse Practitioner

## 2013-08-14 NOTE — Telephone Encounter (Signed)
A doctor she saw tuseday put her on on a catherter qid ,but she wont do it what else can we do

## 2013-08-14 NOTE — Telephone Encounter (Signed)
Will have to call urologist an dlet him know thatand see what they say. I doubt there is anything else that can be done

## 2013-08-15 NOTE — Telephone Encounter (Signed)
Pt aware.

## 2013-08-25 ENCOUNTER — Other Ambulatory Visit: Payer: Self-pay

## 2013-08-25 MED ORDER — MIRTAZAPINE 15 MG PO TABS: 15.0000 mg | ORAL_TABLET | Freq: Every day | ORAL | Status: DC

## 2013-10-29 ENCOUNTER — Other Ambulatory Visit: Payer: Self-pay

## 2013-10-29 DIAGNOSIS — E785 Hyperlipidemia, unspecified: Secondary | ICD-10-CM

## 2013-10-29 DIAGNOSIS — R32 Unspecified urinary incontinence: Secondary | ICD-10-CM

## 2013-10-29 MED ORDER — SOLIFENACIN SUCCINATE 10 MG PO TABS: 10.0000 mg | ORAL_TABLET | Freq: Every day | ORAL | Status: DC

## 2013-10-29 MED ORDER — SAXAGLIPTIN-METFORMIN ER 2.5-1000 MG PO TB24: 1.0000 | ORAL_TABLET | Freq: Two times a day (BID) | ORAL | Status: DC

## 2013-11-17 ENCOUNTER — Other Ambulatory Visit: Payer: Self-pay

## 2013-11-17 ENCOUNTER — Ambulatory Visit: Payer: Medicare Other | Admitting: Nurse Practitioner

## 2013-11-17 DIAGNOSIS — F32A Depression, unspecified: Secondary | ICD-10-CM

## 2013-11-17 DIAGNOSIS — F329 Major depressive disorder, single episode, unspecified: Secondary | ICD-10-CM

## 2013-11-17 DIAGNOSIS — I10 Essential (primary) hypertension: Secondary | ICD-10-CM

## 2013-11-17 MED ORDER — CITALOPRAM HYDROBROMIDE 20 MG PO TABS: 20.0000 mg | ORAL_TABLET | Freq: Every day | ORAL | Status: DC

## 2013-11-17 MED ORDER — CARVEDILOL 25 MG PO TABS: 25.0000 mg | ORAL_TABLET | Freq: Two times a day (BID) | ORAL | Status: DC

## 2013-11-21 ENCOUNTER — Encounter: Payer: Self-pay | Admitting: Nurse Practitioner

## 2013-11-21 ENCOUNTER — Ambulatory Visit (INDEPENDENT_AMBULATORY_CARE_PROVIDER_SITE_OTHER): Payer: Medicare Other | Admitting: Nurse Practitioner

## 2013-11-21 ENCOUNTER — Telehealth: Payer: Self-pay | Admitting: *Deleted

## 2013-11-21 VITALS — BP 114/70 | HR 68 | Temp 97.6°F | Ht <= 58 in | Wt 140.0 lb

## 2013-11-21 DIAGNOSIS — I509 Heart failure, unspecified: Secondary | ICD-10-CM

## 2013-11-21 DIAGNOSIS — E876 Hypokalemia: Secondary | ICD-10-CM

## 2013-11-21 DIAGNOSIS — E119 Type 2 diabetes mellitus without complications: Secondary | ICD-10-CM

## 2013-11-21 DIAGNOSIS — R1310 Dysphagia, unspecified: Secondary | ICD-10-CM

## 2013-11-21 DIAGNOSIS — E039 Hypothyroidism, unspecified: Secondary | ICD-10-CM

## 2013-11-21 DIAGNOSIS — M1990 Unspecified osteoarthritis, unspecified site: Secondary | ICD-10-CM

## 2013-11-21 DIAGNOSIS — E785 Hyperlipidemia, unspecified: Secondary | ICD-10-CM

## 2013-11-21 DIAGNOSIS — I10 Essential (primary) hypertension: Secondary | ICD-10-CM

## 2013-11-21 LAB — POCT GLYCOSYLATED HEMOGLOBIN (HGB A1C): Hemoglobin A1C: 6.2

## 2013-11-21 MED ORDER — GLUCOSE BLOOD VI STRP: ORAL_STRIP | Status: DC

## 2013-11-21 NOTE — Telephone Encounter (Signed)
Tried to contact patient to see how often she is testing so that we can write the prescription correctly. Unable to reach patient by phone.

## 2013-11-21 NOTE — Telephone Encounter (Signed)
Pharmacy requesting test strips. Insurance has changed and they need it written a specific way: One touch ultra, directions, quantity, dx code of 250.02, with no more than 5 refills

## 2013-11-21 NOTE — Progress Notes (Signed)
Subjective:    Patient ID: Jamie Cochran, female    DOB: Sep 12, 1930, 80 y.o.   MRN: [id number]  Patient here today for follow up- no changes since last visit- memory is worsening  Hypertension This is a chronic problem. The current episode started more than 1 year ago. The problem has been gradually improving since onset. The problem is uncontrolled. Pertinent negatives include no blurred vision, chest pain, headaches, neck pain, palpitations, peripheral edema or shortness of breath. Risk factors for coronary artery disease include diabetes mellitus, dyslipidemia, family history and post-menopausal state. Past treatments include ACE inhibitors. The current treatment provides moderate improvement. Hypertensive end-organ damage includes a thyroid problem. There is no history of sleep apnea.  Hyperlipidemia This is a chronic problem. The current episode started more than 1 year ago. The problem is controlled. Recent lipid tests were reviewed and are normal. Exacerbating diseases include diabetes. She has no history of liver disease or obesity. There are no known factors aggravating her hyperlipidemia. Pertinent negatives include no chest pain or shortness of breath. Current antihyperlipidemic treatment includes statins. The current treatment provides moderate improvement of lipids. There are no compliance problems.  Risk factors for coronary artery disease include diabetes mellitus, dyslipidemia, family history, hypertension and post-menopausal.  Diabetes She presents for her follow-up diabetic visit. She has type 2 diabetes mellitus. There are no hypoglycemic associated symptoms. Pertinent negatives for hypoglycemia include no headaches. Pertinent negatives for diabetes include no blurred vision, no chest pain, no polydipsia, no polyphagia, no polyuria, no visual change and no weight loss. There are no hypoglycemic complications. There are no diabetic complications. Risk factors for coronary artery  disease include diabetes mellitus, dyslipidemia, family history, hypertension and post-menopausal. Current diabetic treatment includes oral agent (dual therapy) and insulin injections. She is compliant with treatment all of the time. Her weight is fluctuating minimally. She is following a diabetic diet. She participates in exercise daily. Her breakfast blood glucose is taken between 8-9 am. Her breakfast blood glucose range is generally 90-110 mg/dl. Her lunch blood glucose is taken between 12-1 pm. Her lunch blood glucose range is generally 110-130 mg/dl. An ACE inhibitor/angiotensin II receptor blocker is being taken. She does not see a podiatrist.Eye exam is current (Sept 2014).  Thyroid Problem Presents for follow-up visit. Symptoms include diarrhea. Patient reports no constipation, depressed mood, dry skin, leg swelling, palpitations, visual change or weight loss. The symptoms have been stable. Past treatments include beta blockers and levothyroxine. The treatment provided significant relief. Her past medical history is significant for diabetes and hyperlipidemia.  Gastrophageal Reflux She reports no chest pain, no coughing, no heartburn or no nausea. This is a chronic problem. The current episode started more than 1 year ago. The problem occurs rarely. The problem has been resolved. The symptoms are aggravated by certain foods. Pertinent negatives include no weight loss. She has tried a PPI for the symptoms. The treatment provided significant relief.   Depression Pt takes Celexa 20 mg-Pt tolerate well with no complaints  Insomnia Pt taking Remeron 15 mg- Pt states it helps her sleep  * Patient still refuses to take a bath daily and she pees on herself frequently.   *Review of Systems  Constitutional: Negative for weight loss.  Eyes: Negative for blurred vision.  Respiratory: Negative for cough and shortness of breath.   Cardiovascular: Negative for chest pain and palpitations.   Gastrointestinal: Positive for diarrhea. Negative for heartburn, nausea and constipation.  Endocrine: Negative for polydipsia, polyphagia and polyuria.  Musculoskeletal: Negative for neck pain.  Neurological: Negative for headaches.  All other systems reviewed and are negative.       Objective:   Physical Exam  Vitals reviewed. Constitutional: She is oriented to person, place, and time. She appears well-developed and well-nourished.  HENT:  Head: Normocephalic.  Eyes: EOM are normal. Pupils are equal, round, and reactive to light.  Neck: Normal range of motion. Neck supple. No thyromegaly present.  Cardiovascular: Normal rate, regular rhythm, normal heart sounds and intact distal pulses.   No murmur heard. Pulmonary/Chest: Effort normal and breath sounds normal. No respiratory distress. She has no wheezes. She has no rales. She exhibits no tenderness.  Abdominal: Soft. Bowel sounds are normal. She exhibits no distension. There is no tenderness.  Musculoskeletal: Normal range of motion. She exhibits no edema and no tenderness.  Neurological: She is alert and oriented to person, place, and time.  Skin: Skin is warm and dry.  Psychiatric: She has a normal mood and affect. Her behavior is normal. Judgment and thought content normal.    BP 114/70  Pulse 68  Temp(Src) 97.6 F (36.4 C) (Oral)  Ht _0  (1.422 m)  Wt 140 lb (63.504 kg)  BMI 31.41 kg/m2  LMP 02/04/1963    Results for orders placed in visit on 11/21/13  POCT GLYCOSYLATED HEMOGLOBIN (HGB A1C)      Result Value Range   Hemoglobin A1C 6.2%    mini-mental exam score of 28/30  Assessment & Plan:   1. Hypertension   2. Hypothyroidism   3. Diabetes mellitus   4. Hyperlipidemia   5. Hypokalemia   6. Osteoarthritis   7. Congestive heart failure    Orders Placed This Encounter  Procedures  . NMR, lipoprofile  . Thyroid Panel With TSH  . CMP14+EGFR  . POCT glycosylated hemoglobin (Hb A1C)   Meds ordered this  encounter  Medications  . glucose blood test strip    Sig: Test 3-4 X a day   Dx 250.02    Dispense:  300 each    Refill:  12    Order Specific Question:  Supervising Provider    Answer:  Chipper Herb [[id number]]      Continue all meds Labs pending Diet and exercise encouraged Health maintenance reviewed Follow up in 3 months  [place], FNP

## 2013-11-21 NOTE — Patient Instructions (Signed)

## 2013-11-22 LAB — CMP14+EGFR
A/G RATIO: 1.4 (ref 1.1–2.5)
ALK PHOS: 59 IU/L (ref 39–117)
ALT: 8 IU/L (ref 0–32)
AST: 12 IU/L (ref 0–40)
Albumin: 3.7 g/dL (ref 3.5–4.7)
BILIRUBIN TOTAL: 0.3 mg/dL (ref 0.0–1.2)
BUN / CREAT RATIO: 26 (ref 11–26)
BUN: 21 mg/dL (ref 8–27)
CHLORIDE: 97 mmol/L (ref 97–108)
CO2: 30 mmol/L — ABNORMAL HIGH (ref 18–29)
Calcium: 9.8 mg/dL (ref 8.6–10.2)
Creatinine, Ser: 0.81 mg/dL (ref 0.57–1.00)
GFR, EST AFRICAN AMERICAN: 79 mL/min/{1.73_m2} (ref >59)
GFR, EST NON AFRICAN AMERICAN: 68 mL/min/{1.73_m2} (ref >59)
Globulin, Total: 2.6 g/dL (ref 1.5–4.5)
Glucose: 51 mg/dL — ABNORMAL LOW (ref 65–99)
POTASSIUM: 4.4 mmol/L (ref 3.5–5.2)
SODIUM: 140 mmol/L (ref 134–144)
Total Protein: 6.3 g/dL (ref 6.0–8.5)

## 2013-11-22 LAB — THYROID PANEL WITH TSH
Free Thyroxine Index: 3.4 (ref 1.2–4.9)
T3 Uptake Ratio: 34 % (ref 24–39)
T4 TOTAL: 10.1 ug/dL (ref 4.5–12.0)
TSH: 2.3 u[IU]/mL (ref 0.450–4.500)

## 2013-11-22 LAB — NMR, LIPOPROFILE
Cholesterol: 175 mg/dL (ref <200)
HDL CHOLESTEROL BY NMR: 30 mg/dL — AB (ref >=40)
HDL PARTICLE NUMBER: 23.1 umol/L — AB (ref >=30.5)
LDL Particle Number: 2039 nmol/L — ABNORMAL HIGH (ref <1000)
LDL Size: 19.9 nm — ABNORMAL LOW (ref >20.5)
LDLC SERPL CALC-MCNC: 94 mg/dL (ref <100)
LP-IR SCORE: 83 — AB (ref <=45)
SMALL LDL PARTICLE NUMBER: 1609 nmol/L — AB (ref <=527)
Triglycerides by NMR: 254 mg/dL — ABNORMAL HIGH (ref <150)

## 2013-11-24 ENCOUNTER — Other Ambulatory Visit: Payer: Self-pay | Admitting: Nurse Practitioner

## 2013-11-25 ENCOUNTER — Other Ambulatory Visit: Payer: Self-pay | Admitting: Nurse Practitioner

## 2013-11-25 MED ORDER — GLUCOSE BLOOD VI STRP: ORAL_STRIP | Status: DC

## 2013-11-26 ENCOUNTER — Other Ambulatory Visit: Payer: Self-pay

## 2013-11-26 DIAGNOSIS — K219 Gastro-esophageal reflux disease without esophagitis: Secondary | ICD-10-CM

## 2013-11-26 MED ORDER — OMEPRAZOLE 40 MG PO CPDR: 40.0000 mg | DELAYED_RELEASE_CAPSULE | Freq: Every day | ORAL | Status: DC

## 2013-11-27 MED ORDER — GLUCOSE BLOOD VI STRP: 1.0000 | ORAL_STRIP | Freq: Three times a day (TID) | Status: DC

## 2013-11-27 NOTE — Telephone Encounter (Signed)
Order changed.

## 2013-12-01 ENCOUNTER — Telehealth: Payer: Self-pay | Admitting: Nurse Practitioner

## 2013-12-01 ENCOUNTER — Other Ambulatory Visit: Payer: Self-pay | Admitting: Nurse Practitioner

## 2013-12-01 DIAGNOSIS — E119 Type 2 diabetes mellitus without complications: Secondary | ICD-10-CM

## 2013-12-01 MED ORDER — GLUCOSE BLOOD VI STRP: 1.0000 | ORAL_STRIP | Freq: Three times a day (TID) | Status: DC

## 2013-12-02 ENCOUNTER — Encounter: Payer: Self-pay | Admitting: Gastroenterology

## 2013-12-11 ENCOUNTER — Other Ambulatory Visit: Payer: Self-pay | Admitting: Nurse Practitioner

## 2013-12-12 ENCOUNTER — Other Ambulatory Visit: Payer: Self-pay | Admitting: *Deleted

## 2013-12-12 MED ORDER — INSULIN GLARGINE 100 UNIT/ML ~~LOC~~ SOLN: 24.0000 [IU] | Freq: Every day | SUBCUTANEOUS | Status: DC

## 2013-12-12 NOTE — Telephone Encounter (Signed)
rx request says that she injects 25 units and computer says 24. Please advise

## 2013-12-16 ENCOUNTER — Other Ambulatory Visit: Payer: Self-pay

## 2013-12-16 ENCOUNTER — Telehealth: Payer: Self-pay | Admitting: Nurse Practitioner

## 2013-12-16 ENCOUNTER — Encounter: Payer: Self-pay | Admitting: Family Medicine

## 2013-12-16 ENCOUNTER — Ambulatory Visit (INDEPENDENT_AMBULATORY_CARE_PROVIDER_SITE_OTHER): Payer: Medicare Other | Admitting: Family Medicine

## 2013-12-16 VITALS — BP 152/76 | HR 79 | Temp 97.2°F | Ht <= 58 in | Wt 140.2 lb

## 2013-12-16 DIAGNOSIS — M549 Dorsalgia, unspecified: Secondary | ICD-10-CM

## 2013-12-16 DIAGNOSIS — I10 Essential (primary) hypertension: Secondary | ICD-10-CM

## 2013-12-16 DIAGNOSIS — R5381 Other malaise: Secondary | ICD-10-CM

## 2013-12-16 MED ORDER — MELOXICAM 15 MG PO TABS: 15.0000 mg | ORAL_TABLET | Freq: Every day | ORAL | Status: DC

## 2013-12-16 MED ORDER — FUROSEMIDE 40 MG PO TABS: 40.0000 mg | ORAL_TABLET | Freq: Every day | ORAL | Status: DC

## 2013-12-16 MED ORDER — MIRTAZAPINE 15 MG PO TABS: 15.0000 mg | ORAL_TABLET | Freq: Every day | ORAL | Status: DC

## 2013-12-16 NOTE — Telephone Encounter (Signed)
SENT CALL TO TRIAGE

## 2013-12-16 NOTE — Progress Notes (Signed)
   Subjective:    Patient ID: Glenford Peershyllis D Billey, female    DOB: 11-Mar-1930, 80 y.o.   MRN: [id number]  HPI  This 80 y.o. female presents for evaluation of back pain.  Her family accompany her And state she sits around and doesn't get up and get around as good as she was prior To her CVA.  She c/o multiple arthralgias  Review of Systems C/o back pain   No chest pain, SOB, HA, dizziness, vision change, N/V, diarrhea, constipation, dysuria, urinary urgency or frequency or rash.  Objective:   Physical Exam Vital signs noted  Elderly female in NAD.  HEENT - Head atraumatic Normocephalic Respiratory - Lungs CTA bilateral Cardiac - RRR S1 and S2 without murmur Neuro - Grossly intact. MS - TTP left LS muslce      Assessment & Plan:  Back pain - Plan: meloxicam (MOBIC) 15 MG tablet, Ambulatory referral to Physical Therapy  Physical deconditioning - Plan: meloxicam (MOBIC) 15 MG tablet, Ambulatory referral to Physical Therapy  Deatra CanterWilliam J Wallace Cogliano FNP

## 2013-12-22 ENCOUNTER — Ambulatory Visit: Payer: Medicare Other | Admitting: Gastroenterology

## 2013-12-25 ENCOUNTER — Ambulatory Visit: Payer: Medicare Other | Attending: Family Medicine | Admitting: Physical Therapy

## 2013-12-25 DIAGNOSIS — R5381 Other malaise: Secondary | ICD-10-CM | POA: Insufficient documentation

## 2013-12-25 DIAGNOSIS — M545 Low back pain, unspecified: Secondary | ICD-10-CM | POA: Insufficient documentation

## 2013-12-30 ENCOUNTER — Encounter: Payer: Medicare Other | Admitting: Physical Therapy

## 2014-01-01 ENCOUNTER — Encounter: Payer: Medicare Other | Admitting: Physical Therapy

## 2014-01-09 ENCOUNTER — Telehealth: Payer: Self-pay | Admitting: Nurse Practitioner

## 2014-01-09 DIAGNOSIS — I10 Essential (primary) hypertension: Secondary | ICD-10-CM

## 2014-01-09 MED ORDER — AMLODIPINE BESYLATE 5 MG PO TABS: 5.0000 mg | ORAL_TABLET | Freq: Every day | ORAL | Status: DC

## 2014-01-09 NOTE — Telephone Encounter (Signed)
Done

## 2014-01-13 ENCOUNTER — Other Ambulatory Visit: Payer: Self-pay

## 2014-01-13 ENCOUNTER — Ambulatory Visit: Payer: Medicare Other | Attending: Family Medicine | Admitting: Physical Therapy

## 2014-01-13 ENCOUNTER — Telehealth: Payer: Self-pay

## 2014-01-13 DIAGNOSIS — R5381 Other malaise: Secondary | ICD-10-CM | POA: Insufficient documentation

## 2014-01-13 DIAGNOSIS — I10 Essential (primary) hypertension: Secondary | ICD-10-CM

## 2014-01-13 DIAGNOSIS — M545 Low back pain, unspecified: Secondary | ICD-10-CM | POA: Insufficient documentation

## 2014-01-13 MED ORDER — LISINOPRIL 20 MG PO TABS: 20.0000 mg | ORAL_TABLET | Freq: Every day | ORAL | Status: DC

## 2014-01-15 ENCOUNTER — Encounter: Payer: Medicare Other | Admitting: *Deleted

## 2014-01-15 NOTE — Telephone Encounter (Signed)
x

## 2014-01-20 ENCOUNTER — Ambulatory Visit: Payer: Medicare Other | Admitting: Physical Therapy

## 2014-01-22 ENCOUNTER — Ambulatory Visit: Payer: Medicare Other | Admitting: Physical Therapy

## 2014-01-27 ENCOUNTER — Encounter: Payer: Medicare Other | Admitting: Physical Therapy

## 2014-01-28 ENCOUNTER — Other Ambulatory Visit: Payer: Self-pay | Admitting: Nurse Practitioner

## 2014-01-29 ENCOUNTER — Encounter: Payer: Medicare Other | Admitting: Physical Therapy

## 2014-02-02 ENCOUNTER — Other Ambulatory Visit: Payer: Self-pay | Admitting: Family Medicine

## 2014-02-17 ENCOUNTER — Emergency Department (HOSPITAL_COMMUNITY)
Admission: EM | Admit: 2014-02-17 | Discharge: 2014-02-17 | Disposition: A | Discharge status: Home / Self Care | Payer: Medicare Other | Attending: Emergency Medicine | Admitting: Emergency Medicine

## 2014-02-17 ENCOUNTER — Encounter (HOSPITAL_COMMUNITY): Payer: Self-pay | Admitting: Emergency Medicine

## 2014-02-17 ENCOUNTER — Emergency Department (HOSPITAL_COMMUNITY): Payer: Medicare Other

## 2014-02-17 DIAGNOSIS — E039 Hypothyroidism, unspecified: Secondary | ICD-10-CM | POA: Insufficient documentation

## 2014-02-17 DIAGNOSIS — Z7982 Long term (current) use of aspirin: Secondary | ICD-10-CM | POA: Insufficient documentation

## 2014-02-17 DIAGNOSIS — Z8673 Personal history of transient ischemic attack (TIA), and cerebral infarction without residual deficits: Secondary | ICD-10-CM | POA: Insufficient documentation

## 2014-02-17 DIAGNOSIS — R4789 Other speech disturbances: Secondary | ICD-10-CM | POA: Insufficient documentation

## 2014-02-17 DIAGNOSIS — Z794 Long term (current) use of insulin: Secondary | ICD-10-CM | POA: Insufficient documentation

## 2014-02-17 DIAGNOSIS — Z8669 Personal history of other diseases of the nervous system and sense organs: Secondary | ICD-10-CM | POA: Insufficient documentation

## 2014-02-17 DIAGNOSIS — IMO0002 Reserved for concepts with insufficient information to code with codable children: Secondary | ICD-10-CM | POA: Insufficient documentation

## 2014-02-17 DIAGNOSIS — E1165 Type 2 diabetes mellitus with hyperglycemia: Secondary | ICD-10-CM | POA: Insufficient documentation

## 2014-02-17 DIAGNOSIS — I10 Essential (primary) hypertension: Secondary | ICD-10-CM | POA: Insufficient documentation

## 2014-02-17 DIAGNOSIS — I2510 Atherosclerotic heart disease of native coronary artery without angina pectoris: Secondary | ICD-10-CM | POA: Insufficient documentation

## 2014-02-17 DIAGNOSIS — Z79899 Other long term (current) drug therapy: Secondary | ICD-10-CM | POA: Insufficient documentation

## 2014-02-17 DIAGNOSIS — Z791 Long term (current) use of non-steroidal anti-inflammatories (NSAID): Secondary | ICD-10-CM | POA: Insufficient documentation

## 2014-02-17 DIAGNOSIS — E1169 Type 2 diabetes mellitus with other specified complication: Principal | ICD-10-CM

## 2014-02-17 DIAGNOSIS — Z8739 Personal history of other diseases of the musculoskeletal system and connective tissue: Secondary | ICD-10-CM | POA: Insufficient documentation

## 2014-02-17 DIAGNOSIS — I509 Heart failure, unspecified: Secondary | ICD-10-CM | POA: Insufficient documentation

## 2014-02-17 DIAGNOSIS — E162 Hypoglycemia, unspecified: Secondary | ICD-10-CM

## 2014-02-17 LAB — CBC WITH DIFFERENTIAL/PLATELET
BASOS PCT: 0 % (ref 0–1)
Basophils Absolute: 0 10*3/uL (ref 0.0–0.1)
EOS PCT: 1 % (ref 0–5)
Eosinophils Absolute: 0.1 10*3/uL (ref 0.0–0.7)
HEMATOCRIT: 39.4 % (ref 36.0–46.0)
Hemoglobin: 13.3 g/dL (ref 12.0–15.0)
Lymphocytes Relative: 18 % (ref 12–46)
Lymphs Abs: 1.8 10*3/uL (ref 0.7–4.0)
MCH: 28.4 pg (ref 26.0–34.0)
MCHC: 33.8 g/dL (ref 30.0–36.0)
MCV: 84 fL (ref 78.0–100.0)
MONOS PCT: 7 % (ref 3–12)
Monocytes Absolute: 0.7 10*3/uL (ref 0.1–1.0)
NEUTROS ABS: 7.3 10*3/uL (ref 1.7–7.7)
Neutrophils Relative %: 74 % (ref 43–77)
Platelets: 174 10*3/uL (ref 150–400)
RBC: 4.69 MIL/uL (ref 3.87–5.11)
RDW: 14.3 % (ref 11.5–15.5)
WBC: 10 10*3/uL (ref 4.0–10.5)

## 2014-02-17 LAB — URINE MICROSCOPIC-ADD ON

## 2014-02-17 LAB — BASIC METABOLIC PANEL
BUN: 25 mg/dL — AB (ref 6–23)
CALCIUM: 9.1 mg/dL (ref 8.4–10.5)
CHLORIDE: 99 meq/L (ref 96–112)
CO2: 23 mEq/L (ref 19–32)
Creatinine, Ser: 0.93 mg/dL (ref 0.50–1.10)
GFR calc Af Amer: 65 mL/min — ABNORMAL LOW (ref >90)
GFR calc non Af Amer: 56 mL/min — ABNORMAL LOW (ref >90)
Glucose, Bld: 101 mg/dL — ABNORMAL HIGH (ref 70–99)
Potassium: 4.2 mEq/L (ref 3.7–5.3)
Sodium: 139 mEq/L (ref 137–147)

## 2014-02-17 LAB — URINALYSIS, ROUTINE W REFLEX MICROSCOPIC
Bilirubin Urine: NEGATIVE
GLUCOSE, UA: NEGATIVE mg/dL
Hgb urine dipstick: NEGATIVE
KETONES UR: NEGATIVE mg/dL
Nitrite: POSITIVE — AB
PH: 5 (ref 5.0–8.0)
PROTEIN: NEGATIVE mg/dL
Specific Gravity, Urine: 1.017 (ref 1.005–1.030)
Urobilinogen, UA: 0.2 mg/dL (ref 0.0–1.0)

## 2014-02-17 LAB — CBG MONITORING, ED: Glucose-Capillary: 155 mg/dL — ABNORMAL HIGH (ref 70–99)

## 2014-02-17 LAB — TROPONIN I: Troponin I: <0.3 ng/mL (ref <0.30)

## 2014-02-17 NOTE — ED Notes (Signed)
Pt from home, c/o a;tereed mentalstatues, initial call for slurred speech and confusion,family did cbg was 34, per ems, initial CBG was higher then meter, last was 96, pt alert and oriented. No other complaints

## 2014-02-17 NOTE — Discharge Instructions (Signed)

## 2014-02-17 NOTE — ED Notes (Signed)
Dr Pickering at bedside 

## 2014-02-17 NOTE — ED Provider Notes (Signed)
CSN: [id number]     Arrival date & time 02/17/14  1747 History   First MD Initiated Contact with Patient 02/17/14 1754     Chief Complaint  Patient presents with  . Altered Mental Status     (Consider location/radiation/quality/duration/timing/severity/associated sxs/prior Treatment) Patient is a 80 y.o. female presenting with altered mental status. The history is provided by the patient.  Altered Mental Status Presenting symptoms: confusion   Associated symptoms: no abdominal pain, no headaches, no nausea, no rash, no vomiting and no weakness    patient was found confused by family members. She looked somewhat pale. Blood sugar was done at home and found to be 37. Patient did not want to do much eating. She eventually some orange juice and some chocolate. She remains somewhat confused EMS was called. EMS reportedly arrived and the sugar read off her meter high. Upon recheck it was 90. Patient's mental status improved somewhat. She denies fever cough. She denies abdominal pain. No nausea vomiting or diarrhea. She reportedly always has a urinary tract infection. She denies dysuria. She denies headache. Patient is awake and near her baseline at this time.  Past Medical History  Diagnosis Date  . Diabetes type 2, uncontrolled   . Non-occlusive coronary artery disease     40% first diagonal stenosis, 50% obtuse marginal stenosis  . Hypertension   . Stroke in 05/2012    deep right frontal perventricular deep white matter infarct + subacute left thalamic lacunar infarct  . Hyperlipidemia   . Hypothyroidism   . Osteoporosis   . Hearing loss     history of right sided cholesteotoma s/p Right tympanoplasty with ossicular reconstruction. (03/2002)  . Urinary incontinence     s/p surgeyr for ureterocele  . CHF (congestive heart failure)    Past Surgical History  Procedure Laterality Date  . Abdominal hysterectomy    . Cesarean section    . Bladder repair    . Inner ear surgery    .  Tonsillectomy and adenoidectomy     Family History  Problem Relation Age of Onset  . Heart disease Father    History  Substance Use Topics  . Smoking status: Never Smoker   . Smokeless tobacco: Not on file  . Alcohol Use: No   OB History   Grav Para Term Preterm Abortions TAB SAB Ect Mult Living                 Review of Systems  Constitutional: Negative for activity change and appetite change.  Eyes: Negative for pain.  Respiratory: Negative for chest tightness and shortness of breath.   Cardiovascular: Negative for chest pain and leg swelling.  Gastrointestinal: Negative for nausea, vomiting, abdominal pain and diarrhea.  Genitourinary: Negative for flank pain.  Musculoskeletal: Negative for back pain and neck stiffness.  Skin: Negative for rash.  Neurological: Positive for speech difficulty. Negative for weakness, numbness and headaches.  Psychiatric/Behavioral: Positive for confusion. Negative for behavioral problems.      Allergies  Sulfa antibiotics  Home Medications   Current Outpatient Rx  Name  Route  Sig  Dispense  Refill  . amLODipine (NORVASC) 5 MG tablet   Oral   Take 1 tablet (5 mg total) by mouth daily.   30 tablet   4   . aspirin 81 MG tablet   Oral   Take 81 mg by mouth daily.         . carvedilol (COREG) 25 MG tablet  TAKE ONE TABLET BY MOUTH TWICE DAILY WITH  A  MEAL   60 tablet   0   . Cinnamon 500 MG capsule   Oral   Take 500 mg by mouth 2 (two) times daily.         . citalopram (CELEXA) 20 MG tablet      TAKE ONE TABLET BY MOUTH ONCE DAILY   30 tablet   3   . cloNIDine (CATAPRES) 0.1 MG tablet   Oral   Take 1 tablet (0.1 mg total) by mouth 2 (two) times daily.   60 tablet   5   . furosemide (LASIX) 40 MG tablet   Oral   Take 1 tablet (40 mg total) by mouth daily.   30 tablet   5   . insulin glargine (LANTUS) 100 UNIT/ML injection   Subcutaneous   Inject 20 Units into the skin at bedtime.         Marland Kitchen  KOMBIGLYZE XR 2.03-999 MG TB24      TAKE ONE TABLET BY MOUTH TWICE DAILY   60 tablet   2   . levothyroxine (SYNTHROID, LEVOTHROID) 75 MCG tablet   Oral   Take 1 tablet (75 mcg total) by mouth daily.   30 tablet   5   . lisinopril (PRINIVIL,ZESTRIL) 20 MG tablet   Oral   Take 1 tablet (20 mg total) by mouth daily.   30 tablet   5   . meloxicam (MOBIC) 15 MG tablet   Oral   Take 1 tablet (15 mg total) by mouth daily.   30 tablet   3   . mirtazapine (REMERON) 15 MG tablet   Oral   Take 1 tablet (15 mg total) by mouth at bedtime.   30 tablet   2   . omeprazole (PRILOSEC) 40 MG capsule   Oral   Take 1 capsule (40 mg total) by mouth daily.   30 capsule   5   . potassium chloride (K-DUR) 10 MEQ tablet      TAKE TWO TABLETS BY MOUTH ONCE DAILY   60 tablet   5   . Saxagliptin-Metformin 2.03-999 MG TB24   Oral   Take by mouth.         . VESICARE 10 MG tablet      TAKE ONE TABLET BY MOUTH ONCE DAILY   30 tablet   0   . glucose blood (ONE TOUCH ULTRA TEST) test strip   Other   1 each by Other route 4 (four) times daily - after meals and at bedtime.   100 each   5   . EXPIRED: insulin aspart (NOVOLOG) 100 UNIT/ML injection   Subcutaneous   Inject 0-15 Units into the skin 3 (three) times daily with meals.   1 vial       BP 164/66  Pulse 66  Temp(Src) 98.1 F (36.7 C) (Oral)  Resp 16  SpO2 95%  LMP 02/04/1963 Physical Exam  Nursing note and vitals reviewed. Constitutional: She is oriented to person, place, and time. She appears well-developed and well-nourished.  HENT:  Head: Normocephalic and atraumatic.  Eyes: EOM are normal. Pupils are equal, round, and reactive to light.  Neck: Normal range of motion. Neck supple.  Cardiovascular: Normal rate, regular rhythm and normal heart sounds.   No murmur heard. Pulmonary/Chest: Effort normal and breath sounds normal. No respiratory distress. She has no wheezes. She has no rales.  Abdominal: Soft. Bowel  sounds are normal. She  exhibits distension. There is no tenderness. There is no rebound and no guarding.  Mild distention and no tenderness.  Musculoskeletal: Normal range of motion.  Neurological: She is alert and oriented to person, place, and time. No cranial nerve deficit.  Patient is awake and pleasant. She appears to be near her baseline per family.  Skin: Skin is warm and dry.  Psychiatric: She has a normal mood and affect. Her speech is normal.    ED Course  Procedures (including critical care time) Labs Review Labs Reviewed  BASIC METABOLIC PANEL - Abnormal; Notable for the following:    Glucose, Bld 101 (*)    BUN 25 (*)    GFR calc non Af Amer 56 (*)    GFR calc Af Amer 65 (*)    All other components within normal limits  URINALYSIS, ROUTINE W REFLEX MICROSCOPIC - Abnormal; Notable for the following:    Nitrite POSITIVE (*)    Leukocytes, UA SMALL (*)    All other components within normal limits  URINE MICROSCOPIC-ADD ON - Abnormal; Notable for the following:    Bacteria, UA MANY (*)    All other components within normal limits  CBG MONITORING, ED - Abnormal; Notable for the following:    Glucose-Capillary 155 (*)    All other components within normal limits  URINE CULTURE  CBC WITH DIFFERENTIAL  TROPONIN I   Imaging Review Dg Chest 2 View  02/17/2014   CLINICAL DATA:  Altered mental status.  EXAM: CHEST  2 VIEW  COMPARISON:  DG CHEST 2 VIEW dated 05/28/2012; DG CHEST 2 VIEW dated 04/05/2009  FINDINGS: There are persistent low lung volumes with chronic central airway thickening and patchy bibasilar pulmonary opacities. There is no confluent airspace opacity or significant pleural effusion. The heart size and mediastinal contours are stable. There are degenerative changes throughout the spine and a chronic L1 compression deformity.  IMPRESSION: Stable chronic lung disease with hypoventilation and central airway thickening. No acute process demonstrated.   Electronically  Signed   By: Roxy HorsemanBill  Veazey M.D.   On: 02/17/2014 19:18     EKG Interpretation None      MDM   Final diagnoses:  Hypoglycemia    Patient with altered mental status. Likely due to hypoglycemia. Hemoglobin is been stable and patient has eaten here. No infections. Urine culture sent. Will discharge home    Juliet Rudeathan R. Rubin PayorPickering, MD 02/18/14 [phone]

## 2014-02-20 ENCOUNTER — Encounter: Payer: Self-pay | Admitting: Nurse Practitioner

## 2014-02-20 ENCOUNTER — Ambulatory Visit (INDEPENDENT_AMBULATORY_CARE_PROVIDER_SITE_OTHER): Payer: Medicare Other | Admitting: Nurse Practitioner

## 2014-02-20 VITALS — BP 180/83 | HR 69 | Temp 97.2°F | Ht <= 58 in | Wt 146.2 lb

## 2014-02-20 DIAGNOSIS — I10 Essential (primary) hypertension: Secondary | ICD-10-CM

## 2014-02-20 DIAGNOSIS — E785 Hyperlipidemia, unspecified: Secondary | ICD-10-CM

## 2014-02-20 DIAGNOSIS — E876 Hypokalemia: Secondary | ICD-10-CM

## 2014-02-20 DIAGNOSIS — E119 Type 2 diabetes mellitus without complications: Secondary | ICD-10-CM

## 2014-02-20 DIAGNOSIS — E039 Hypothyroidism, unspecified: Secondary | ICD-10-CM

## 2014-02-20 DIAGNOSIS — I509 Heart failure, unspecified: Secondary | ICD-10-CM

## 2014-02-20 LAB — URINE CULTURE

## 2014-02-20 LAB — POCT GLYCOSYLATED HEMOGLOBIN (HGB A1C): Hemoglobin A1C: 5.6

## 2014-02-20 NOTE — Patient Instructions (Signed)

## 2014-02-20 NOTE — Progress Notes (Signed)
Subjective:    Patient ID: Jamie Cochran, female    DOB: 23-Jan-1930, 80 y.o.   MRN: [id number]  Patient here today for follow up- no changes since last visit- memory is worsening  Diabetes She presents for her follow-up diabetic visit. She has type 2 diabetes mellitus. There are no hypoglycemic associated symptoms. Pertinent negatives for hypoglycemia include no headaches. Pertinent negatives for diabetes include no blurred vision, no chest pain, no polydipsia, no polyphagia, no polyuria, no visual change and no weight loss. Hypoglycemia complications include hospitalization. (Blood sugar dropped to 30's and family paniced and called EMS.) There are no diabetic complications. Risk factors for coronary artery disease include diabetes mellitus, dyslipidemia, family history, hypertension and post-menopausal. Current diabetic treatment includes oral agent (dual therapy) and insulin injections. She is compliant with treatment all of the time. Her weight is fluctuating minimally. She is following a diabetic diet. She participates in exercise daily. Her breakfast blood glucose is taken between 8-9 am. Her breakfast blood glucose range is generally 90-110 mg/dl. Her lunch blood glucose is taken between 12-1 pm. Her lunch blood glucose range is generally 110-130 mg/dl. An ACE inhibitor/angiotensin II receptor blocker is being taken. She does not see a podiatrist.Eye exam is current (Sept 2014).  Hypertension This is a chronic problem. The current episode started more than 1 year ago. The problem has been gradually improving since onset. The problem is uncontrolled. Pertinent negatives include no blurred vision, chest pain, headaches, neck pain, palpitations, peripheral edema or shortness of breath. Risk factors for coronary artery disease include diabetes mellitus, dyslipidemia, family history and post-menopausal state. Past treatments include ACE inhibitors. The current treatment provides moderate  improvement. Hypertensive end-organ damage includes a thyroid problem. There is no history of sleep apnea.  Congestive Heart Failure Pertinent negatives include no chest pain, palpitations or shortness of breath.  Hyperlipidemia This is a chronic problem. The current episode started more than 1 year ago. The problem is controlled. Recent lipid tests were reviewed and are normal. Exacerbating diseases include diabetes. She has no history of liver disease or obesity. There are no known factors aggravating her hyperlipidemia. Pertinent negatives include no chest pain or shortness of breath. Current antihyperlipidemic treatment includes statins. The current treatment provides moderate improvement of lipids. There are no compliance problems.  Risk factors for coronary artery disease include diabetes mellitus, dyslipidemia, family history, hypertension and post-menopausal.  Thyroid Problem Presents for follow-up visit. Symptoms include diarrhea. Patient reports no constipation, depressed mood, dry skin, leg swelling, palpitations, visual change or weight loss. The symptoms have been stable. Past treatments include beta blockers and levothyroxine. The treatment provided significant relief. Her past medical history is significant for diabetes and hyperlipidemia.  Gastrophageal Reflux She reports no chest pain, no coughing, no heartburn or no nausea. This is a chronic problem. The current episode started more than 1 year ago. The problem occurs rarely. The problem has been resolved. The symptoms are aggravated by certain foods. Pertinent negatives include no weight loss. She has tried a PPI for the symptoms. The treatment provided significant relief.  Depression Pt takes Celexa 20 mg-Pt tolerate well with no complaints Insomnia Pt taking Remeron 15 mg- Pt states it helps her sleep     *Review of Systems  Constitutional: Negative for weight loss.  Eyes: Negative for blurred vision.  Respiratory: Negative  for cough and shortness of breath.   Cardiovascular: Negative for chest pain and palpitations.  Gastrointestinal: Positive for diarrhea. Negative for heartburn, nausea and constipation.  Endocrine: Negative for polydipsia, polyphagia and polyuria.  Musculoskeletal: Negative for neck pain.  Neurological: Negative for headaches.  All other systems reviewed and are negative.      Objective:   Physical Exam  Vitals reviewed. Constitutional: She is oriented to person, place, and time. She appears well-developed and well-nourished.  HENT:  Head: Normocephalic.  Eyes: EOM are normal. Pupils are equal, round, and reactive to light.  Neck: Normal range of motion. Neck supple. No thyromegaly present.  Cardiovascular: Normal rate, regular rhythm, normal heart sounds and intact distal pulses.   No murmur heard. Pulmonary/Chest: Effort normal and breath sounds normal. No respiratory distress. She has no wheezes. She has no rales. She exhibits no tenderness.  Abdominal: Soft. Bowel sounds are normal. She exhibits no distension. There is no tenderness.  Musculoskeletal: Normal range of motion. She exhibits no edema and no tenderness.  Neurological: She is alert and oriented to person, place, and time.  Skin: Skin is warm and dry.  Psychiatric: She has a normal mood and affect. Her behavior is normal. Judgment and thought content normal.  Patient seems less talkative then usual answers most questions appropriately.    BP 180/83  Pulse 69  Temp(Src) 97.2 F (36.2 C) (Oral)  Ht _0  (1.422 m)  Wt 146 lb 3.2 oz (66.316 kg)  BMI 32.80 kg/m2  LMP 02/04/1963  Results for orders placed in visit on 02/20/14  POCT GLYCOSYLATED HEMOGLOBIN (HGB A1C)      Result Value Ref Range   Hemoglobin A1C 5.6%        Assessment & Plan:    1. Diabetes   2. Hyperlipidemia   3. Hypertension   4. Unspecified hypothyroidism   5. Hypokalemia   6. Diabetes mellitus   7. Congestive heart failure    Orders  Placed This Encounter  Procedures  . CMP14+EGFR  . NMR, lipoprofile  . Thyroid Panel With TSH  . POCT glycosylated hemoglobin (Hb A1C)   Eat a snack at bedtime decrease kombyglize 1 x a day Labs pending Health maintenance reviewed Diet and exercise encouraged Continue all meds Follow up  In 3 months   [place], FNP

## 2014-02-21 ENCOUNTER — Telehealth (HOSPITAL_BASED_OUTPATIENT_CLINIC_OR_DEPARTMENT_OTHER): Payer: Self-pay | Admitting: Emergency Medicine

## 2014-02-21 LAB — CMP14+EGFR
A/G RATIO: 1.8 (ref 1.1–2.5)
ALBUMIN: 3.9 g/dL (ref 3.5–4.7)
ALK PHOS: 54 IU/L (ref 39–117)
ALT: 12 IU/L (ref 0–32)
AST: 15 IU/L (ref 0–40)
BILIRUBIN TOTAL: 0.3 mg/dL (ref 0.0–1.2)
BUN / CREAT RATIO: 27 — AB (ref 11–26)
BUN: 25 mg/dL (ref 8–27)
CO2: 27 mmol/L (ref 18–29)
CREATININE: 0.93 mg/dL (ref 0.57–1.00)
Calcium: 9.1 mg/dL (ref 8.7–10.3)
Chloride: 100 mmol/L (ref 97–108)
GFR calc non Af Amer: 58 mL/min/{1.73_m2} — ABNORMAL LOW (ref >59)
GFR, EST AFRICAN AMERICAN: 67 mL/min/{1.73_m2} (ref >59)
GLOBULIN, TOTAL: 2.2 g/dL (ref 1.5–4.5)
Glucose: 143 mg/dL — ABNORMAL HIGH (ref 65–99)
Potassium: 4.4 mmol/L (ref 3.5–5.2)
Sodium: 142 mmol/L (ref 134–144)
Total Protein: 6.1 g/dL (ref 6.0–8.5)

## 2014-02-21 LAB — NMR, LIPOPROFILE
CHOLESTEROL: 141 mg/dL (ref <200)
HDL Cholesterol by NMR: 30 mg/dL — ABNORMAL LOW (ref >=40)
HDL PARTICLE NUMBER: 20.5 umol/L — AB (ref >=30.5)
LDL Particle Number: 1400 nmol/L — ABNORMAL HIGH (ref <1000)
LDL SIZE: 20.3 nm — AB (ref >20.5)
LDLC SERPL CALC-MCNC: 73 mg/dL (ref <100)
LP-IR SCORE: 72 — AB (ref <=45)
Small LDL Particle Number: 976 nmol/L — ABNORMAL HIGH (ref <=527)
Triglycerides by NMR: 188 mg/dL — ABNORMAL HIGH (ref <150)

## 2014-02-21 LAB — THYROID PANEL WITH TSH
FREE THYROXINE INDEX: 2.2 (ref 1.2–4.9)
T3 Uptake Ratio: 33 % (ref 24–39)
T4, Total: 6.6 ug/dL (ref 4.5–12.0)
TSH: 2.32 u[IU]/mL (ref 0.450–4.500)

## 2014-02-21 NOTE — Progress Notes (Signed)
[age] YOF discharged from ED on 4/7 without antibiotics. Her urine cx grew > 100K E.coli susceptible to cephalosporins. Per discussion with PA, will prescribe Ceftin 500 mg twice daily x 10 days.   Vinnie LevelBenjamin Maheen Cwikla, PharmD.  Clinical Pharmacist Pager [phone]

## 2014-02-22 NOTE — Telephone Encounter (Signed)
Post ED Visit - Positive Culture Follow-up: Successful Patient Follow-Up  Culture assessed and recommendations reviewed by: []  Wes Dulaney, Pharm.D., BCPS []  Celedonio MiyamotoJeremy Frens, [address] BoulevardPharm.D., BCPS []  Georgina PillionElizabeth Martin, Pharm.D., BCPS []  RemingtonMinh Pham, [place]arm[place]D., BCPS, AAHIVP []  Estella HuskMichelle Turner, Pharm.D., BCPS, AAHIVP [x]  Vinnie LevelBenjamin Mancheril, Pharm.D.  Positive urine culture  [x]  Patient discharged without antimicrobial prescription and treatment is now indicated []  Organism is resistant to prescribed ED discharge antimicrobial []  Patient with positive blood cultures  Changes discussed with ED provider: Trixie DredgeEmily West PA-C New antibiotic prescription: Ceftin 500 mg PO BID x 5 days    [facility] AllianceKylie Ruben Cochran 02/22/2014, 9:47 AM

## 2014-02-24 ENCOUNTER — Other Ambulatory Visit: Payer: Self-pay | Admitting: Nurse Practitioner

## 2014-02-25 ENCOUNTER — Telehealth: Payer: Self-pay | Admitting: Nurse Practitioner

## 2014-02-25 NOTE — Telephone Encounter (Signed)
Taken care of in result notes patient aware

## 2014-02-27 ENCOUNTER — Other Ambulatory Visit: Payer: Self-pay | Admitting: Family Medicine

## 2014-03-02 ENCOUNTER — Other Ambulatory Visit: Payer: Self-pay | Admitting: *Deleted

## 2014-03-02 DIAGNOSIS — E039 Hypothyroidism, unspecified: Secondary | ICD-10-CM

## 2014-03-02 MED ORDER — LEVOTHYROXINE SODIUM 75 MCG PO TABS: 75.0000 ug | ORAL_TABLET | Freq: Every day | ORAL | Status: DC

## 2014-03-11 ENCOUNTER — Other Ambulatory Visit: Payer: Self-pay | Admitting: Nurse Practitioner

## 2014-03-27 ENCOUNTER — Other Ambulatory Visit: Payer: Self-pay | Admitting: Family Medicine

## 2014-03-30 ENCOUNTER — Other Ambulatory Visit: Payer: Self-pay | Admitting: Nurse Practitioner

## 2014-04-13 ENCOUNTER — Other Ambulatory Visit: Payer: Self-pay | Admitting: Nurse Practitioner

## 2014-05-07 ENCOUNTER — Telehealth: Payer: Self-pay | Admitting: Nurse Practitioner

## 2014-05-07 ENCOUNTER — Telehealth (HOSPITAL_BASED_OUTPATIENT_CLINIC_OR_DEPARTMENT_OTHER): Payer: Self-pay | Admitting: Emergency Medicine

## 2014-05-07 ENCOUNTER — Ambulatory Visit (INDEPENDENT_AMBULATORY_CARE_PROVIDER_SITE_OTHER): Payer: Medicare Other | Admitting: Family Medicine

## 2014-05-07 ENCOUNTER — Encounter: Payer: Self-pay | Admitting: Family Medicine

## 2014-05-07 VITALS — BP 149/71 | HR 72 | Temp 97.9°F | Ht <= 58 in | Wt 149.0 lb

## 2014-05-07 DIAGNOSIS — R609 Edema, unspecified: Secondary | ICD-10-CM

## 2014-05-07 MED ORDER — METOLAZONE 2.5 MG PO TABS: ORAL_TABLET | ORAL | Status: DC

## 2014-05-07 NOTE — Progress Notes (Signed)
   Subjective:    Patient ID: Jamie Cochran, female    DOB: 10-30-1930, 80 y.o.   MRN: [id number]  HPI  C/o edema in legs for last few days  Review of Systems C/o swelling   No chest pain, SOB, HA, dizziness, vision change, N/V, diarrhea, constipation, dysuria, urinary urgency or frequency, myalgias, arthralgias or rash.  Objective:   Physical Exam  Extremities - bilateral pre-tibial edema one plus      Assessment & Plan:  Edema - Plan: metolazone (ZAROXOLYN) 2.5 MG  Po qd x 2-3 days #30 wear venous compression stockings Deatra CanterWilliam J Oxford FNP

## 2014-05-07 NOTE — Telephone Encounter (Signed)
appt given for 4:30 today

## 2014-05-25 ENCOUNTER — Other Ambulatory Visit: Payer: Self-pay | Admitting: Nurse Practitioner

## 2014-05-26 ENCOUNTER — Ambulatory Visit (INDEPENDENT_AMBULATORY_CARE_PROVIDER_SITE_OTHER): Payer: Medicare Other | Admitting: Nurse Practitioner

## 2014-05-26 ENCOUNTER — Encounter: Payer: Self-pay | Admitting: Nurse Practitioner

## 2014-05-26 ENCOUNTER — Telehealth: Payer: Self-pay | Admitting: *Deleted

## 2014-05-26 VITALS — BP 162/80 | HR 70 | Temp 98.4°F | Ht <= 58 in | Wt 149.0 lb

## 2014-05-26 DIAGNOSIS — E876 Hypokalemia: Secondary | ICD-10-CM

## 2014-05-26 DIAGNOSIS — E785 Hyperlipidemia, unspecified: Secondary | ICD-10-CM

## 2014-05-26 DIAGNOSIS — I10 Essential (primary) hypertension: Secondary | ICD-10-CM

## 2014-05-26 DIAGNOSIS — E109 Type 1 diabetes mellitus without complications: Secondary | ICD-10-CM

## 2014-05-26 DIAGNOSIS — M810 Age-related osteoporosis without current pathological fracture: Secondary | ICD-10-CM

## 2014-05-26 DIAGNOSIS — N393 Stress incontinence (female) (male): Secondary | ICD-10-CM

## 2014-05-26 DIAGNOSIS — E038 Other specified hypothyroidism: Secondary | ICD-10-CM

## 2014-05-26 LAB — POCT GLYCOSYLATED HEMOGLOBIN (HGB A1C): Hemoglobin A1C: 6.3

## 2014-05-26 MED ORDER — MIRABEGRON ER 25 MG PO TB24: 25.0000 mg | ORAL_TABLET | Freq: Every day | ORAL | Status: DC

## 2014-05-26 NOTE — Telephone Encounter (Signed)
Discussed with patient in office

## 2014-05-26 NOTE — Telephone Encounter (Signed)
Let patient know had to change vesicare to myrbetriq because of medication not good with another rmed she is taking.

## 2014-05-26 NOTE — Patient Instructions (Signed)

## 2014-05-26 NOTE — Progress Notes (Signed)
Subjective:    Patient ID: Jamie Cochran, female    DOB: 1930/10/19, 80 y.o.   MRN: [id number]  Patient here today for follow up- no changes since last visit- memory is worsening  Diabetes She presents for her follow-up diabetic visit. She has type 2 diabetes mellitus. There are no hypoglycemic associated symptoms. Pertinent negatives for hypoglycemia include no headaches. Pertinent negatives for diabetes include no blurred vision, no chest pain, no polydipsia, no polyphagia, no polyuria, no visual change and no weight loss. Hypoglycemia complications include hospitalization. (Blood sugar dropped to 30's and family paniced and called EMS.) There are no diabetic complications. Risk factors for coronary artery disease include diabetes mellitus, dyslipidemia, family history, hypertension and post-menopausal. Current diabetic treatment includes oral agent (dual therapy) and insulin injections. She is compliant with treatment all of the time. Her weight is fluctuating minimally. She is following a diabetic diet. She participates in exercise daily. Her breakfast blood glucose is taken between 8-9 am. Her breakfast blood glucose range is generally 90-110 mg/dl. Her lunch blood glucose is taken between 12-1 pm. Her lunch blood glucose range is generally 110-130 mg/dl. An ACE inhibitor/angiotensin II receptor blocker is being taken. She does not see a podiatrist.Eye exam is current (Sept 2014).  Hypertension This is a chronic problem. The current episode started more than 1 year ago. The problem has been gradually improving since onset. The problem is uncontrolled. Pertinent negatives include no blurred vision, chest pain, headaches, neck pain, palpitations, peripheral edema or shortness of breath. Risk factors for coronary artery disease include diabetes mellitus, dyslipidemia, family history and post-menopausal state. Past treatments include ACE inhibitors. The current treatment provides moderate  improvement. Hypertensive end-organ damage includes a thyroid problem. There is no history of sleep apnea.  Congestive Heart Failure Pertinent negatives include no chest pain, palpitations or shortness of breath.  Hyperlipidemia This is a chronic problem. The current episode started more than 1 year ago. The problem is controlled. Recent lipid tests were reviewed and are normal. Exacerbating diseases include diabetes. She has no history of liver disease or obesity. There are no known factors aggravating her hyperlipidemia. Pertinent negatives include no chest pain or shortness of breath. Current antihyperlipidemic treatment includes statins. The current treatment provides moderate improvement of lipids. There are no compliance problems.  Risk factors for coronary artery disease include diabetes mellitus, dyslipidemia, family history, hypertension and post-menopausal.  Thyroid Problem Presents for follow-up visit. Symptoms include diarrhea. Patient reports no constipation, depressed mood, dry skin, leg swelling, palpitations, visual change or weight loss. The symptoms have been stable. Past treatments include beta blockers and levothyroxine. The treatment provided significant relief. Her past medical history is significant for diabetes and hyperlipidemia.  Gastrophageal Reflux She reports no chest pain, no coughing, no heartburn or no nausea. This is a chronic problem. The current episode started more than 1 year ago. The problem occurs rarely. The problem has been resolved. The symptoms are aggravated by certain foods. Pertinent negatives include no weight loss. She has tried a PPI for the symptoms. The treatment provided significant relief.  Depression Pt takes Celexa 20 mg-Pt tolerate well with no complaints Insomnia Pt taking Remeron 15 mg- Pt states it helps her sleep  * peripheral edema- patient wants to sit at kitchen table a lot and cannot prop legs up there and they swell. * she is  hallucinating at times and talking to people that are not there.   *Review of Systems  Constitutional: Negative for weight loss.  Eyes: Negative for blurred vision.  Respiratory: Negative for cough and shortness of breath.   Cardiovascular: Negative for chest pain and palpitations.  Gastrointestinal: Positive for diarrhea. Negative for heartburn, nausea and constipation.  Endocrine: Negative for polydipsia, polyphagia and polyuria.  Musculoskeletal: Negative for neck pain.  Neurological: Negative for headaches.  All other systems reviewed and are negative.      Objective:   Physical Exam  Vitals reviewed. Constitutional: She is oriented to person, place, and time. She appears well-developed and well-nourished.  HENT:  Head: Normocephalic.  Eyes: EOM are normal. Pupils are equal, round, and reactive to light.  Neck: Normal range of motion. Neck supple. No thyromegaly present.  Cardiovascular: Normal rate, regular rhythm, normal heart sounds and intact distal pulses.   No murmur heard. Pulmonary/Chest: Effort normal and breath sounds normal. No respiratory distress. She has no wheezes. She has no rales. She exhibits no tenderness.  Abdominal: Soft. Bowel sounds are normal. She exhibits no distension. There is no tenderness.  Musculoskeletal: Normal range of motion. She exhibits edema (2+ edema bil lower ext). She exhibits no tenderness.  Neurological: She is alert and oriented to person, place, and time.  Skin: Skin is warm and dry.  Psychiatric: She has a normal mood and affect. Her behavior is normal. Judgment and thought content normal.  Patient seems less talkative then usual answers most questions appropriately.    BP 162/80  Pulse 70  Temp(Src) 98.4 F (36.9 C) (Oral)  Ht '4\' 8"'  (1.422 m)  Wt 149 lb (67.586 kg)  BMI 33.42 kg/m2  LMP 02/04/1963  Mini mental exam 27/30  Results for orders placed in visit on 05/26/14  POCT GLYCOSYLATED HEMOGLOBIN (HGB A1C)      Result  Value Ref Range   Hemoglobin A1C 6.3%        Assessment & Plan:   1. Type 1 diabetes mellitus without complication   2. Hyperlipidemia   3. Essential hypertension   4. Stress incontinence, female   5. Osteoporosis   6. Other specified hypothyroidism   7. Hypokalemia    Orders Placed This Encounter  Procedures  . CMP14+EGFR  . NMR, lipoprofile  . POCT glycosylated hemoglobin (Hb A1C)    Labs pending Health maintenance reviewed Diet and exercise encouraged Continue all meds Follow up  In 3 month   [place], FNP

## 2014-05-26 NOTE — Telephone Encounter (Signed)
[facility] Ch[facility] contraindicated in patients on anticholinergic agent(Vesicare). Please advise [facility] what to do? I see on med list pt is taking both.

## 2014-05-28 LAB — NMR, LIPOPROFILE

## 2014-05-28 LAB — CMP14+EGFR
ALBUMIN: 4.1 g/dL (ref 3.5–4.7)
ALK PHOS: 72 IU/L (ref 39–117)
ALT: 13 IU/L (ref 0–32)
AST: 14 IU/L (ref 0–40)
Albumin/Globulin Ratio: 1.6 (ref 1.1–2.5)
BUN / CREAT RATIO: 30 — AB (ref 11–26)
BUN: 29 mg/dL — AB (ref 8–27)
CO2: 27 mmol/L (ref 18–29)
CREATININE: 0.98 mg/dL (ref 0.57–1.00)
Calcium: 9.5 mg/dL (ref 8.7–10.3)
Chloride: 98 mmol/L (ref 97–108)
GFR calc Af Amer: 63 mL/min/{1.73_m2} (ref >59)
GFR calc non Af Amer: 54 mL/min/{1.73_m2} — ABNORMAL LOW (ref >59)
GLOBULIN, TOTAL: 2.5 g/dL (ref 1.5–4.5)
Glucose: 116 mg/dL — ABNORMAL HIGH (ref 65–99)
Potassium: 4.3 mmol/L (ref 3.5–5.2)
Sodium: 141 mmol/L (ref 134–144)
Total Bilirubin: 0.3 mg/dL (ref 0.0–1.2)
Total Protein: 6.6 g/dL (ref 6.0–8.5)

## 2014-05-28 LAB — SPECIMEN STATUS REPORT

## 2014-05-29 ENCOUNTER — Telehealth: Payer: Self-pay | Admitting: Family Medicine

## 2014-05-29 LAB — LIPID PANEL WITH LDL/HDL RATIO
Cholesterol, Total: 192 mg/dL (ref 100–199)
HDL: 28 mg/dL — AB (ref >39)
LDL Calculated: 94 mg/dL (ref 0–99)
LDl/HDL Ratio: 3.4 ratio units — ABNORMAL HIGH (ref 0.0–3.2)
Triglycerides: 348 mg/dL — ABNORMAL HIGH (ref 0–149)
VLDL Cholesterol Cal: 70 mg/dL — ABNORMAL HIGH (ref 5–40)

## 2014-05-29 LAB — SPECIMEN STATUS REPORT

## 2014-05-29 NOTE — Telephone Encounter (Signed)
Message copied by Azalee CourseFULP, Lavella Myren on Fri May 29, 2014 10:20 AM ------      Message from: Bennie PieriniMARTIN, MARY-MARGARET      Created: Fri May 29, 2014  9:15 AM       Kidney and liver function stable      Hgba1c discussed at appointment      Trig are elevated- needs to decrease carbs/sweets in diet      Continue current meds- low fat diet and exercise and recheck in 3 months             ------

## 2014-06-02 ENCOUNTER — Encounter: Payer: Self-pay | Admitting: Nurse Practitioner

## 2014-06-15 ENCOUNTER — Other Ambulatory Visit: Payer: Self-pay | Admitting: Family Medicine

## 2014-06-15 ENCOUNTER — Other Ambulatory Visit: Payer: Self-pay | Admitting: Nurse Practitioner

## 2014-07-09 ENCOUNTER — Other Ambulatory Visit: Payer: Self-pay | Admitting: Nurse Practitioner

## 2014-07-20 ENCOUNTER — Other Ambulatory Visit: Payer: Self-pay | Admitting: Nurse Practitioner

## 2014-07-20 ENCOUNTER — Other Ambulatory Visit: Payer: Self-pay | Admitting: Family Medicine

## 2014-08-14 ENCOUNTER — Other Ambulatory Visit: Payer: Self-pay | Admitting: Nurse Practitioner

## 2014-08-27 ENCOUNTER — Other Ambulatory Visit: Payer: Self-pay | Admitting: Nurse Practitioner

## 2014-08-27 ENCOUNTER — Other Ambulatory Visit: Payer: Self-pay

## 2014-08-27 ENCOUNTER — Encounter: Payer: Self-pay | Admitting: Nurse Practitioner

## 2014-08-27 ENCOUNTER — Ambulatory Visit (INDEPENDENT_AMBULATORY_CARE_PROVIDER_SITE_OTHER): Payer: Medicare Other | Admitting: Nurse Practitioner

## 2014-08-27 VITALS — BP 142/88 | HR 69 | Temp 97.3°F | Ht <= 58 in | Wt 149.0 lb

## 2014-08-27 DIAGNOSIS — E038 Other specified hypothyroidism: Secondary | ICD-10-CM

## 2014-08-27 DIAGNOSIS — Z23 Encounter for immunization: Secondary | ICD-10-CM

## 2014-08-27 DIAGNOSIS — M6289 Other specified disorders of muscle: Secondary | ICD-10-CM

## 2014-08-27 DIAGNOSIS — I5022 Chronic systolic (congestive) heart failure: Secondary | ICD-10-CM

## 2014-08-27 DIAGNOSIS — N393 Stress incontinence (female) (male): Secondary | ICD-10-CM

## 2014-08-27 DIAGNOSIS — E876 Hypokalemia: Secondary | ICD-10-CM

## 2014-08-27 DIAGNOSIS — E785 Hyperlipidemia, unspecified: Secondary | ICD-10-CM

## 2014-08-27 DIAGNOSIS — Z1239 Encounter for other screening for malignant neoplasm of breast: Secondary | ICD-10-CM

## 2014-08-27 DIAGNOSIS — E109 Type 1 diabetes mellitus without complications: Secondary | ICD-10-CM

## 2014-08-27 DIAGNOSIS — R531 Weakness: Secondary | ICD-10-CM

## 2014-08-27 DIAGNOSIS — I10 Essential (primary) hypertension: Secondary | ICD-10-CM

## 2014-08-27 DIAGNOSIS — M810 Age-related osteoporosis without current pathological fracture: Secondary | ICD-10-CM

## 2014-08-27 LAB — POCT GLYCOSYLATED HEMOGLOBIN (HGB A1C): Hemoglobin A1C: 6.5

## 2014-08-27 LAB — POCT UA - MICROALBUMIN: MICROALBUMIN (UR) POC: NEGATIVE mg/L

## 2014-08-27 NOTE — Addendum Note (Signed)
Addended by: Bennie PieriniMARTIN, MARY-MARGARET on: 08/27/2014 03:07 PM   Modules accepted: Orders

## 2014-08-27 NOTE — Progress Notes (Signed)
Subjective:    Patient ID: Jamie Cochran, female    DOB: [birth year], 80 y.o.   MRN: [id number]  Patient here today for follow up- no changes since last visit- memory is worsening  Diabetes She presents for her follow-up diabetic visit. She has type 2 diabetes mellitus. There are no hypoglycemic associated symptoms. Pertinent negatives for hypoglycemia include no headaches. Pertinent negatives for diabetes include no blurred vision, no chest pain, no polydipsia, no polyphagia, no polyuria, no visual change and no weight loss. Hypoglycemia complications include hospitalization. (Blood sugar dropped to 30's and family paniced and called EMS.) There are no diabetic complications. Risk factors for coronary artery disease include diabetes mellitus, dyslipidemia, family history, hypertension and post-menopausal. Current diabetic treatment includes oral agent (dual therapy) and insulin injections. She is compliant with treatment all of the time. Her weight is fluctuating minimally. She is following a diabetic diet. She participates in exercise daily. Her breakfast blood glucose is taken between 8-9 am. Her breakfast blood glucose range is generally 90-110 mg/dl. Her lunch blood glucose is taken between 12-1 pm. Her lunch blood glucose range is generally 110-130 mg/dl. An ACE inhibitor/angiotensin II receptor blocker is being taken. She does not see a podiatrist.Eye exam is current (Sept 2014).  Hypertension This is a chronic problem. The current episode started more than 1 year ago. The problem has been gradually improving since onset. The problem is uncontrolled. Pertinent negatives include no blurred vision, chest pain, headaches, neck pain, palpitations, peripheral edema or shortness of breath. Risk factors for coronary artery disease include diabetes mellitus, dyslipidemia, family history and post-menopausal state. Past treatments include ACE inhibitors. The current treatment provides moderate  improvement. Hypertensive end-organ damage includes a thyroid problem. There is no history of sleep apnea.  Congestive Heart Failure Pertinent negatives include no chest pain, palpitations or shortness of breath.  Hyperlipidemia This is a chronic problem. The current episode started more than 1 year ago. The problem is controlled. Recent lipid tests were reviewed and are normal. Exacerbating diseases include diabetes. She has no history of liver disease or obesity. There are no known factors aggravating her hyperlipidemia. Pertinent negatives include no chest pain or shortness of breath. Current antihyperlipidemic treatment includes statins. The current treatment provides moderate improvement of lipids. There are no compliance problems.  Risk factors for coronary artery disease include diabetes mellitus, dyslipidemia, family history, hypertension and post-menopausal.  Thyroid Problem Presents for follow-up visit. Symptoms include diarrhea. Patient reports no constipation, depressed mood, dry skin, leg swelling, palpitations, visual change or weight loss. The symptoms have been stable. Past treatments include beta blockers and levothyroxine. The treatment provided significant relief. Her past medical history is significant for diabetes and hyperlipidemia.  Gastrophageal Reflux She reports no chest pain, no coughing, no heartburn or no nausea. This is a chronic problem. The current episode started more than 1 year ago. The problem occurs rarely. The problem has been resolved. The symptoms are aggravated by certain foods. Pertinent negatives include no weight loss. She has tried a PPI for the symptoms. The treatment provided significant relief.  Depression Pt takes Celexa 20 mg-Pt tolerate well with no complaints Insomnia Pt taking Remeron 15 mg- Pt states it helps her sleep    *Review of Systems  Constitutional: Negative for weight loss.  Eyes: Negative for blurred vision.  Respiratory: Negative for  cough and shortness of breath.   Cardiovascular: Negative for chest pain and palpitations.  Gastrointestinal: Positive for diarrhea. Negative for heartburn, nausea and constipation.  Endocrine: Negative for polydipsia, polyphagia and polyuria.  Musculoskeletal: Negative for neck pain.  Neurological: Negative for headaches.  All other systems reviewed and are negative.      Objective:   Physical Exam  Vitals reviewed. Constitutional: She is oriented to person, place, and time. She appears well-developed and well-nourished.  HENT:  Head: Normocephalic.  Eyes: EOM are normal. Pupils are equal, round, and reactive to light.  Neck: Normal range of motion. Neck supple. No thyromegaly present.  Cardiovascular: Normal rate, regular rhythm, normal heart sounds and intact distal pulses.   No murmur heard. Pulmonary/Chest: Effort normal and breath sounds normal. No respiratory distress. She has no wheezes. She has no rales. She exhibits no tenderness.  Abdominal: Soft. Bowel sounds are normal. She exhibits no distension. There is no tenderness.  Musculoskeletal: Normal range of motion. She exhibits edema (2+ edema bil lower ext). She exhibits no tenderness.  Neurological: She is alert and oriented to person, place, and time.  Skin: Skin is warm and dry.  Psychiatric: She has a normal mood and affect. Her behavior is normal. Judgment and thought content normal.  Patient seems less talkative then usual answers most questions appropriately.    BP 187/75  Pulse 69  Temp(Src) 97.3 F (36.3 C) (Oral)  Ht '4\' 8"'  (1.422 m)  Wt 149 lb (67.586 kg)  BMI 33.42 kg/m2  LMP 02/04/1963   Results for orders placed in visit on 08/27/14  POCT GLYCOSYLATED HEMOGLOBIN (HGB A1C)      Result Value Ref Range   Hemoglobin A1C 6.5        Assessment & Plan:   1. Type 1 diabetes mellitus without complication - POCT glycosylated hemoglobin (Hb A1C)  2. Hyperlipidemia - NMR, lipoprofile  3. Essential  hypertension - CMP14+EGFR  4. Other specified hypothyroidism - Thyroid Panel With TSH  5. Stress incontinence, female  6. Right sided weakness  7. Osteoporosis  8. Hypokalemia  9. Chronic systolic congestive heart failure    Patient to schedule mammogram Encouraged to get up and move around and do things - dont sit in chair all day long Labs pending Health maintenance reviewed Diet and exercise encouraged Continue all meds Follow up  In [place], [place]

## 2014-08-27 NOTE — Patient Instructions (Signed)

## 2014-08-28 LAB — CMP14+EGFR
A/G RATIO: 1.6 (ref 1.1–2.5)
ALT: 11 IU/L (ref 0–32)
AST: 17 IU/L (ref 0–40)
Albumin: 4 g/dL (ref 3.5–4.7)
Alkaline Phosphatase: 64 IU/L (ref 39–117)
BUN/Creatinine Ratio: 23 (ref 11–26)
BUN: 24 mg/dL (ref 8–27)
CO2: 28 mmol/L (ref 18–29)
CREATININE: 1.06 mg/dL — AB (ref 0.57–1.00)
Calcium: 9.3 mg/dL (ref 8.7–10.3)
Chloride: 100 mmol/L (ref 97–108)
GFR calc Af Amer: 57 mL/min/{1.73_m2} — ABNORMAL LOW (ref >59)
GFR, EST NON AFRICAN AMERICAN: 49 mL/min/{1.73_m2} — AB (ref >59)
GLOBULIN, TOTAL: 2.5 g/dL (ref 1.5–4.5)
GLUCOSE: 92 mg/dL (ref 65–99)
Potassium: 4.3 mmol/L (ref 3.5–5.2)
SODIUM: 141 mmol/L (ref 134–144)
TOTAL PROTEIN: 6.5 g/dL (ref 6.0–8.5)
Total Bilirubin: 0.3 mg/dL (ref 0.0–1.2)

## 2014-08-28 LAB — NMR, LIPOPROFILE
Cholesterol: 162 mg/dL (ref 100–199)
HDL CHOLESTEROL BY NMR: 29 mg/dL — AB (ref >39)
HDL PARTICLE NUMBER: 21 umol/L — AB (ref >=30.5)
LDL Particle Number: 1314 nmol/L — ABNORMAL HIGH (ref <1000)
LDL Size: 20.3 nm (ref >20.5)
LDLC SERPL CALC-MCNC: 92 mg/dL (ref 0–99)
LP-IR Score: 81 — ABNORMAL HIGH (ref <=45)
SMALL LDL PARTICLE NUMBER: 753 nmol/L — AB (ref <=527)
Triglycerides by NMR: 203 mg/dL — ABNORMAL HIGH (ref 0–149)

## 2014-08-28 LAB — THYROID PANEL WITH TSH
Free Thyroxine Index: 2.4 (ref 1.2–4.9)
T3 Uptake Ratio: 32 % (ref 24–39)
T4, Total: 7.5 ug/dL (ref 4.5–12.0)
TSH: 2.33 u[IU]/mL (ref 0.450–4.500)

## 2014-08-31 ENCOUNTER — Telehealth: Payer: Self-pay | Admitting: *Deleted

## 2014-08-31 NOTE — Telephone Encounter (Signed)
Aware. 

## 2014-08-31 NOTE — Telephone Encounter (Signed)
Message copied by Almeta MonasSTONE, JANIE M on Mon Aug 31, 2014  3:43 PM ------      Message from: Bennie PieriniMARTIN, MARY-MARGARET      Created: Fri Aug 28, 2014 12:56 PM       .microalbumin normal      Hgba1c discussed at appointment      Kidney and liver function stable      Cholesterol is improving      Thyroid normal      Continue current meds- low fat diet and exercise and recheck in 3 months       ------

## 2014-09-06 ENCOUNTER — Other Ambulatory Visit: Payer: Self-pay | Admitting: Nurse Practitioner

## 2014-09-08 ENCOUNTER — Ambulatory Visit: Payer: Medicare Other

## 2014-09-15 ENCOUNTER — Other Ambulatory Visit: Payer: Self-pay | Admitting: Nurse Practitioner

## 2014-09-18 ENCOUNTER — Telehealth: Payer: Self-pay | Admitting: Nurse Practitioner

## 2014-09-18 NOTE — Telephone Encounter (Signed)
OTC cold meds will be fine- no decongestant due to htn.

## 2014-09-18 NOTE — Telephone Encounter (Signed)
Family aware  

## 2014-09-21 ENCOUNTER — Other Ambulatory Visit: Payer: Self-pay | Admitting: Nurse Practitioner

## 2014-09-24 ENCOUNTER — Ambulatory Visit
Admission: RE | Admit: 2014-09-24 | Discharge: 2014-09-24 | Disposition: A | Discharge status: Home / Self Care | Payer: Medicare Other | Source: Ambulatory Visit

## 2014-09-24 ENCOUNTER — Telehealth: Payer: Self-pay | Admitting: Nurse Practitioner

## 2014-09-24 DIAGNOSIS — Z1239 Encounter for other screening for malignant neoplasm of breast: Secondary | ICD-10-CM

## 2014-09-24 NOTE — Telephone Encounter (Signed)
Appt given for tomorrow per patients caregiver request

## 2014-09-25 ENCOUNTER — Ambulatory Visit (INDEPENDENT_AMBULATORY_CARE_PROVIDER_SITE_OTHER): Payer: Medicare Other | Admitting: Family Medicine

## 2014-09-25 ENCOUNTER — Encounter: Payer: Self-pay | Admitting: Family Medicine

## 2014-09-25 VITALS — BP 153/69 | HR 61 | Temp 96.7°F | Ht <= 58 in | Wt 146.0 lb

## 2014-09-25 DIAGNOSIS — R05 Cough: Secondary | ICD-10-CM

## 2014-09-25 DIAGNOSIS — R059 Cough, unspecified: Secondary | ICD-10-CM

## 2014-09-25 DIAGNOSIS — H6121 Impacted cerumen, right ear: Secondary | ICD-10-CM

## 2014-09-25 DIAGNOSIS — J329 Chronic sinusitis, unspecified: Secondary | ICD-10-CM

## 2014-09-25 DIAGNOSIS — R0989 Other specified symptoms and signs involving the circulatory and respiratory systems: Secondary | ICD-10-CM

## 2014-09-25 DIAGNOSIS — J310 Chronic rhinitis: Secondary | ICD-10-CM

## 2014-09-25 DIAGNOSIS — R062 Wheezing: Secondary | ICD-10-CM

## 2014-09-25 DIAGNOSIS — J209 Acute bronchitis, unspecified: Secondary | ICD-10-CM

## 2014-09-25 LAB — POCT CBC
GRANULOCYTE PERCENT: 58.3 % (ref 37–80)
HEMATOCRIT: 35 % — AB (ref 37.7–47.9)
HEMOGLOBIN: 11 g/dL — AB (ref 12.2–16.2)
Lymph, poc: 2.1 (ref 0.6–3.4)
MCH, POC: 26.2 pg — AB (ref 27–31.2)
MCHC: 31.6 g/dL — AB (ref 31.8–35.4)
MCV: 82.9 fL (ref 80–97)
MPV: 7.2 fL (ref 0–99.8)
POC GRANULOCYTE: 3.8 (ref 2–6.9)
POC LYMPH PERCENT: 32.4 %L (ref 10–50)
Platelet Count, POC: 209 10*3/uL (ref 142–424)
RBC: 4.2 M/uL (ref 4.04–5.48)
RDW, POC: 15.5 %
WBC: 6.5 10*3/uL (ref 4.6–10.2)

## 2014-09-25 MED ORDER — BUDESONIDE-FORMOTEROL FUMARATE 80-4.5 MCG/ACT IN AERO: 2.0000 | INHALATION_SPRAY | Freq: Two times a day (BID) | RESPIRATORY_TRACT | Status: DC

## 2014-09-25 MED ORDER — AMOXICILLIN 500 MG PO CAPS: 500.0000 mg | ORAL_CAPSULE | Freq: Three times a day (TID) | ORAL | Status: DC

## 2014-09-25 MED ORDER — METHYLPREDNISOLONE ACETATE 80 MG/ML IJ SUSP
40.0000 mg | Freq: Once | INTRAMUSCULAR | Status: AC
  Administered 2014-09-25: 40 mg via INTRAMUSCULAR

## 2014-09-25 NOTE — Progress Notes (Signed)
Subjective:    Patient ID: Jamie Cochran, female    DOB: 04/14/1930, 80 y.o.   MRN: [id number]  HPI Patient here today for cough, congestion, and stopped up ears. She is accompanied today by her granddaughter.she is extremely hard of hearing and her hearing aids have been misplaced. She sees the nurse practitioner regularly and had a chest x-ray several months ago which showed chronic lung disease.          Patient Active Problem List   Diagnosis Date Noted  . Right sided weakness 05/28/2012  . Hypertension 02/02/2011  . Osteoporosis 02/02/2011  . Hyperlipidemia 02/02/2011  . Hypothyroidism 02/02/2011  . Diabetes mellitus 02/02/2011  . Stress incontinence, female 02/02/2011  . Osteoarthritis 02/02/2011  . Congestive heart failure 02/02/2011  . Hypokalemia 02/02/2011   Outpatient Encounter Prescriptions as of 09/25/2014  Medication Sig  . amLODipine (NORVASC) 5 MG tablet TAKE ONE TABLET BY MOUTH ONCE DAILY  . aspirin 81 MG tablet Take 81 mg by mouth daily.  . carvedilol (COREG) 25 MG tablet TAKE ONE TABLET BY MOUTH TWICE DAILY WITH MEALS  . Cinnamon 500 MG capsule Take 500 mg by mouth 2 (two) times daily.  . citalopram (CELEXA) 20 MG tablet TAKE ONE TABLET BY MOUTH ONCE DAILY  . cloNIDine (CATAPRES) 0.1 MG tablet Take 1 tablet (0.1 mg total) by mouth 2 (two) times daily.  . furosemide (LASIX) 40 MG tablet TAKE ONE TABLET BY MOUTH ONCE DAILY  . glucose blood (ONE TOUCH ULTRA TEST) test strip 1 each by Other route 4 (four) times daily - after meals and at bedtime.  . insulin glargine (LANTUS) 100 UNIT/ML injection Inject 20 Units into the skin at bedtime.  Marland Kitchen KOMBIGLYZE XR 2.03-999 MG TB24 TAKE ONE TABLET BY MOUTH TWICE DAILY  . LANTUS 100 UNIT/ML injection INJECT 0.24 MLS (24 UNITS TOTAL) INTO THE SKIN AT BEDTIME  . levothyroxine (SYNTHROID, LEVOTHROID) 75 MCG tablet TAKE ONE TABLET BY MOUTH ONCE DAILY  . lisinopril (PRINIVIL,ZESTRIL) 20 MG tablet TAKE ONE TABLET BY  MOUTH ONCE DAILY  . meloxicam (MOBIC) 15 MG tablet TAKE ONE TABLET BY MOUTH ONCE DAILY  . metolazone (ZAROXOLYN) 2.5 MG tablet Take on po q am for 2 days for severe swelling  . mirabegron ER (MYRBETRIQ) 25 MG TB24 tablet Take 1 tablet (25 mg total) by mouth daily.  . mirtazapine (REMERON) 15 MG tablet TAKE ONE TABLET BY MOUTH AT BEDTIME  . omeprazole (PRILOSEC) 40 MG capsule TAKE ONE CAPSULE BY MOUTH ONCE DAILY  . potassium chloride (K-DUR) 10 MEQ tablet TAKE TWO TABLETS BY MOUTH ONCE DAILY  . Saxagliptin-Metformin 2.03-999 MG TB24 Take by mouth.  . insulin aspart (NOVOLOG) 100 UNIT/ML injection Inject 0-15 Units into the skin 3 (three) times daily with meals.    Review of Systems  Constitutional: Negative.  Negative for fever.  HENT: Positive for congestion.   Eyes: Negative.   Respiratory: Positive for cough.   Cardiovascular: Negative.   Gastrointestinal: Negative.   Endocrine: Negative.   Genitourinary: Negative.   Musculoskeletal: Negative.   Skin: Negative.   Allergic/Immunologic: Negative.   Neurological: Negative.   Hematological: Negative.   Psychiatric/Behavioral: Negative.        Objective:   Physical Exam  Constitutional: She is oriented to person, place, and time. No distress.  Elderly white female who is extremely hearing impaired  HENT:  Head: Normocephalic and atraumatic.  Left Ear: External ear normal.  There is frontal and ethmoid sinus tenderness.The right ear  canal is impacted with cerumen. The throat is red posteriorly.there is nasal congestion bilaterally  Eyes: EOM are normal. Pupils are equal, round, and reactive to light. Right eye exhibits no discharge. Left eye exhibits no discharge. No scleral icterus.  There is a subtle conjunctival hemorrhage in the lateral aspect of the left eye  Neck: Normal range of motion. Neck supple. No thyromegaly present.  Cardiovascular: Normal rate, regular rhythm and normal heart sounds.   No murmur  heard. Pulmonary/Chest: Effort normal. No respiratory distress. She has wheezes. She has no rales. She exhibits no tenderness.  There are inspiratory and expiratory wheezes bilaterally and increased congestion with coughing  Abdominal: She exhibits no mass.  Musculoskeletal: Normal range of motion. She exhibits no edema.  Lymphadenopathy:    She has no cervical adenopathy.  Neurological: She is alert and oriented to person, place, and time.  Severely hearing impaired  Skin: Skin is warm and dry. No rash noted. No erythema.  Psychiatric: She has a normal mood and affect. Her behavior is normal. Judgment and thought content normal.  Nursing note and vitals reviewed.  BP 153/69 mmHg  Pulse 61  Temp(Src) 96.7 F (35.9 C) (Oral)  Ht 4\' 8"  (1.422 m)  Wt 146 lb (66.225 kg)  BMI 32.75 kg/m2  LMP 02/04/1963  Results for orders placed or performed in visit on 09/25/14  POCT CBC  Result Value Ref Range   WBC 6.5 4.6 - 10.2 K/uL   Lymph, poc 2.1 0.6 - 3.4   POC LYMPH PERCENT 32.4 10 - 50 %L   POC Granulocyte 3.8 2 - 6.9   Granulocyte percent 58.3 37 - 80 %G   RBC 4.2 4.04 - 5.48 M/uL   Hemoglobin 11.0 (A) 12.2 - 16.2 g/dL   HCT, POC 16.135.0 (A) 09.637.7 - 47.9 %   MCV 82.9 80 - 97 fL   MCH, POC 26.2 (A) 27 - 31.2 pg   MCHC 31.6 (A) 31.8 - 35.4 g/dL   RDW, POC 04.515.5 %   Platelet Count, POC 209.0 142 - 424 K/uL   MPV 7.2 0 - 99.8 fL   The patient and her granddaughter were made aware of these results before she left the office. The ear cerumen was successfully removed from the right ear canal with lavage       Assessment & Plan:   1. Cough - POCT CBC - methylPREDNISolone acetate (DEPO-MEDROL) injection 40 mg; Inject 0.5 mLs (40 mg total) into the muscle once. - amoxicillin (AMOXIL) 500 MG capsule; Take 1 capsule (500 mg total) by mouth 3 (three) times daily.  Dispense: 30 capsule; Refill: 0 - budesonide-formoterol (SYMBICORT) 80-4.5 MCG/ACT inhaler; Inhale 2 puffs into the lungs 2 (two)  times daily.  Dispense: 1 Inhaler; Refill: 3  2. Chest congestion - POCT CBC - methylPREDNISolone acetate (DEPO-MEDROL) injection 40 mg; Inject 0.5 mLs (40 mg total) into the muscle once. - budesonide-formoterol (SYMBICORT) 80-4.5 MCG/ACT inhaler; Inhale 2 puffs into the lungs 2 (two) times daily.  Dispense: 1 Inhaler; Refill: 3  3. Rhinosinusitis - amoxicillin (AMOXIL) 500 MG capsule; Take 1 capsule (500 mg total) by mouth 3 (three) times daily.  Dispense: 30 capsule; Refill: 0  4. Bronchospasm with bronchitis, acute - amoxicillin (AMOXIL) 500 MG capsule; Take 1 capsule (500 mg total) by mouth 3 (three) times daily.  Dispense: 30 capsule; Refill: 0  5. Wheezing  6. Cerumen impaction, right  Patient Instructions  Drink plenty of fluids Use saline nose spray frequently during the  day Use inhaler regularly 2 sprays or inhalations twice daily and rinse mouth after using Discontinue the meloxicam as this is not good for the kidneys. If medication is needed for pain extra strength Tylenol one 4 times a day would be more appropriate Take antibiotic regularly Monitor blood sugars more closely as they may go up some because of the cortisone injection that we are giving her today Debrox,ear drops, over-the-counter can be used to help soften earwax if used regularly on a monthly basis Please follow-up with a provider in a couple weeks to make sure breathing is improved and come back sooner if there is any worsening of this condition   Nyra Capeson W. Moore MD

## 2014-09-25 NOTE — Patient Instructions (Addendum)
Drink plenty of fluids Use saline nose spray frequently during the day Use inhaler regularly 2 sprays or inhalations twice daily and rinse mouth after using Discontinue the meloxicam as this is not good for the kidneys. If medication is needed for pain extra strength Tylenol one 4 times a day would be more appropriate Take antibiotic regularly Monitor blood sugars more closely as they may go up some because of the cortisone injection that we are giving her today Debrox,ear drops, over-the-counter can be used to help soften earwax if used regularly on a monthly basis Please follow-up with a provider in a couple weeks to make sure breathing is improved and come back sooner if there is any worsening of this condition

## 2014-10-03 ENCOUNTER — Other Ambulatory Visit: Payer: Self-pay | Admitting: Nurse Practitioner

## 2014-10-12 ENCOUNTER — Other Ambulatory Visit: Payer: Self-pay | Admitting: Nurse Practitioner

## 2014-11-09 ENCOUNTER — Other Ambulatory Visit: Payer: Self-pay | Admitting: Nurse Practitioner

## 2014-12-03 ENCOUNTER — Ambulatory Visit: Payer: Medicare Other | Admitting: Nurse Practitioner

## 2014-12-11 ENCOUNTER — Other Ambulatory Visit: Payer: Self-pay | Admitting: Family Medicine

## 2014-12-11 ENCOUNTER — Other Ambulatory Visit: Payer: Self-pay | Admitting: Nurse Practitioner

## 2015-01-05 ENCOUNTER — Inpatient Hospital Stay (HOSPITAL_COMMUNITY)
Admission: EM | Admit: 2015-01-05 | Discharge: 2015-01-07 | DRG: 149 | Disposition: A | Discharge status: Home / Self Care | Payer: Medicare Other | Attending: Internal Medicine | Admitting: Internal Medicine

## 2015-01-05 ENCOUNTER — Emergency Department (HOSPITAL_COMMUNITY): Payer: Medicare Other

## 2015-01-05 ENCOUNTER — Encounter (HOSPITAL_COMMUNITY): Payer: Self-pay | Admitting: Physical Medicine and Rehabilitation

## 2015-01-05 DIAGNOSIS — I2510 Atherosclerotic heart disease of native coronary artery without angina pectoris: Secondary | ICD-10-CM | POA: Diagnosis present

## 2015-01-05 DIAGNOSIS — Z91013 Allergy to seafood: Secondary | ICD-10-CM

## 2015-01-05 DIAGNOSIS — I509 Heart failure, unspecified: Secondary | ICD-10-CM | POA: Diagnosis present

## 2015-01-05 DIAGNOSIS — E119 Type 2 diabetes mellitus without complications: Secondary | ICD-10-CM | POA: Diagnosis present

## 2015-01-05 DIAGNOSIS — M810 Age-related osteoporosis without current pathological fracture: Secondary | ICD-10-CM | POA: Diagnosis present

## 2015-01-05 DIAGNOSIS — E039 Hypothyroidism, unspecified: Secondary | ICD-10-CM

## 2015-01-05 DIAGNOSIS — W06XXXA Fall from bed, initial encounter: Secondary | ICD-10-CM | POA: Diagnosis present

## 2015-01-05 DIAGNOSIS — Z7982 Long term (current) use of aspirin: Secondary | ICD-10-CM

## 2015-01-05 DIAGNOSIS — R42 Dizziness and giddiness: Principal | ICD-10-CM

## 2015-01-05 DIAGNOSIS — N390 Urinary tract infection, site not specified: Secondary | ICD-10-CM | POA: Diagnosis present

## 2015-01-05 DIAGNOSIS — H5509 Other forms of nystagmus: Secondary | ICD-10-CM | POA: Diagnosis present

## 2015-01-05 DIAGNOSIS — E876 Hypokalemia: Secondary | ICD-10-CM | POA: Diagnosis present

## 2015-01-05 DIAGNOSIS — Y92009 Unspecified place in unspecified non-institutional (private) residence as the place of occurrence of the external cause: Secondary | ICD-10-CM

## 2015-01-05 DIAGNOSIS — H9190 Unspecified hearing loss, unspecified ear: Secondary | ICD-10-CM | POA: Diagnosis present

## 2015-01-05 DIAGNOSIS — Z79899 Other long term (current) drug therapy: Secondary | ICD-10-CM

## 2015-01-05 DIAGNOSIS — Z794 Long term (current) use of insulin: Secondary | ICD-10-CM

## 2015-01-05 DIAGNOSIS — R1314 Dysphagia, pharyngoesophageal phase: Secondary | ICD-10-CM | POA: Diagnosis present

## 2015-01-05 DIAGNOSIS — R262 Difficulty in walking, not elsewhere classified: Secondary | ICD-10-CM | POA: Diagnosis present

## 2015-01-05 DIAGNOSIS — E785 Hyperlipidemia, unspecified: Secondary | ICD-10-CM | POA: Diagnosis present

## 2015-01-05 DIAGNOSIS — I10 Essential (primary) hypertension: Secondary | ICD-10-CM | POA: Diagnosis present

## 2015-01-05 DIAGNOSIS — R110 Nausea: Secondary | ICD-10-CM | POA: Diagnosis present

## 2015-01-05 LAB — COMPREHENSIVE METABOLIC PANEL
ALK PHOS: 54 U/L (ref 39–117)
ALT: 12 U/L (ref 0–35)
AST: 17 U/L (ref 0–37)
Albumin: 3.9 g/dL (ref 3.5–5.2)
Anion gap: 7 (ref 5–15)
BUN: 17 mg/dL (ref 6–23)
CALCIUM: 9.5 mg/dL (ref 8.4–10.5)
CO2: 27 mmol/L (ref 19–32)
Chloride: 103 mmol/L (ref 96–112)
Creatinine, Ser: 0.81 mg/dL (ref 0.50–1.10)
GFR calc Af Amer: 76 mL/min — ABNORMAL LOW (ref >90)
GFR, EST NON AFRICAN AMERICAN: 66 mL/min — AB (ref >90)
Glucose, Bld: 99 mg/dL (ref 70–99)
Potassium: 4.2 mmol/L (ref 3.5–5.1)
SODIUM: 137 mmol/L (ref 135–145)
Total Bilirubin: 0.6 mg/dL (ref 0.3–1.2)
Total Protein: 6.7 g/dL (ref 6.0–8.3)

## 2015-01-05 LAB — URINE MICROSCOPIC-ADD ON

## 2015-01-05 LAB — CBC WITH DIFFERENTIAL/PLATELET
Basophils Absolute: 0 10*3/uL (ref 0.0–0.1)
Basophils Relative: 0 % (ref 0–1)
Eosinophils Absolute: 0.1 10*3/uL (ref 0.0–0.7)
Eosinophils Relative: 1 % (ref 0–5)
HCT: 37.5 % (ref 36.0–46.0)
Hemoglobin: 12.2 g/dL (ref 12.0–15.0)
LYMPHS PCT: 35 % (ref 12–46)
Lymphs Abs: 1.8 10*3/uL (ref 0.7–4.0)
MCH: 26.9 pg (ref 26.0–34.0)
MCHC: 32.5 g/dL (ref 30.0–36.0)
MCV: 82.8 fL (ref 78.0–100.0)
MONO ABS: 0.5 10*3/uL (ref 0.1–1.0)
MONOS PCT: 9 % (ref 3–12)
NEUTROS ABS: 2.8 10*3/uL (ref 1.7–7.7)
Neutrophils Relative %: 55 % (ref 43–77)
PLATELETS: 162 10*3/uL (ref 150–400)
RBC: 4.53 MIL/uL (ref 3.87–5.11)
RDW: 14.4 % (ref 11.5–15.5)
WBC: 5.2 10*3/uL (ref 4.0–10.5)

## 2015-01-05 LAB — URINALYSIS, ROUTINE W REFLEX MICROSCOPIC
Bilirubin Urine: NEGATIVE
Glucose, UA: NEGATIVE mg/dL
Hgb urine dipstick: NEGATIVE
KETONES UR: NEGATIVE mg/dL
Nitrite: NEGATIVE
PROTEIN: NEGATIVE mg/dL
Specific Gravity, Urine: 1.016 (ref 1.005–1.030)
UROBILINOGEN UA: 0.2 mg/dL (ref 0.0–1.0)
pH: 5 (ref 5.0–8.0)

## 2015-01-05 LAB — GLUCOSE, CAPILLARY: GLUCOSE-CAPILLARY: 98 mg/dL (ref 70–99)

## 2015-01-05 LAB — I-STAT TROPONIN, ED: Troponin i, poc: 0 ng/mL (ref 0.00–0.08)

## 2015-01-05 MED ORDER — METOLAZONE 2.5 MG PO TABS: 2.5000 mg | ORAL_TABLET | Freq: Every day | ORAL | Status: DC | PRN

## 2015-01-05 MED ORDER — LEVOTHYROXINE SODIUM 50 MCG PO TABS
75.0000 ug | ORAL_TABLET | Freq: Every day | ORAL | Status: DC
  Administered 2015-01-06 – 2015-01-07 (×2): 75 ug via ORAL
  Filled 2015-01-05 (×4): qty 1

## 2015-01-05 MED ORDER — SAXAGLIPTIN-METFORMIN ER 2.5-1000 MG PO TB24: 1.0000 | ORAL_TABLET | Freq: Every day | ORAL | Status: DC

## 2015-01-05 MED ORDER — LISINOPRIL 20 MG PO TABS
20.0000 mg | ORAL_TABLET | Freq: Every day | ORAL | Status: DC
  Administered 2015-01-06 – 2015-01-07 (×2): 20 mg via ORAL
  Filled 2015-01-05 (×3): qty 1

## 2015-01-05 MED ORDER — SODIUM CHLORIDE 0.9 % IJ SOLN
3.0000 mL | Freq: Two times a day (BID) | INTRAMUSCULAR | Status: DC
  Administered 2015-01-05 – 2015-01-07 (×4): 3 mL via INTRAVENOUS

## 2015-01-05 MED ORDER — CARVEDILOL 12.5 MG PO TABS
25.0000 mg | ORAL_TABLET | Freq: Two times a day (BID) | ORAL | Status: DC
Start: 2015-01-06 — End: 2015-01-07
  Administered 2015-01-06 – 2015-01-07 (×4): 25 mg via ORAL
  Filled 2015-01-05 (×4): qty 2

## 2015-01-05 MED ORDER — CITALOPRAM HYDROBROMIDE 10 MG PO TABS
20.0000 mg | ORAL_TABLET | Freq: Every day | ORAL | Status: DC
  Administered 2015-01-06 – 2015-01-07 (×2): 20 mg via ORAL
  Filled 2015-01-05 (×2): qty 2

## 2015-01-05 MED ORDER — ASPIRIN EC 81 MG PO TBEC
81.0000 mg | DELAYED_RELEASE_TABLET | Freq: Every day | ORAL | Status: DC
  Administered 2015-01-06 – 2015-01-07 (×2): 81 mg via ORAL
  Filled 2015-01-05 (×2): qty 1

## 2015-01-05 MED ORDER — MIRABEGRON ER 25 MG PO TB24
25.0000 mg | ORAL_TABLET | Freq: Every day | ORAL | Status: DC
  Administered 2015-01-06 – 2015-01-07 (×2): 25 mg via ORAL
  Filled 2015-01-05 (×2): qty 1

## 2015-01-05 MED ORDER — FUROSEMIDE 40 MG PO TABS
40.0000 mg | ORAL_TABLET | Freq: Every day | ORAL | Status: DC
  Administered 2015-01-06 – 2015-01-07 (×2): 40 mg via ORAL
  Filled 2015-01-05 (×2): qty 1

## 2015-01-05 MED ORDER — PANTOPRAZOLE SODIUM 40 MG PO TBEC
40.0000 mg | DELAYED_RELEASE_TABLET | Freq: Every day | ORAL | Status: DC
  Administered 2015-01-06 – 2015-01-07 (×2): 40 mg via ORAL
  Filled 2015-01-05 (×2): qty 1

## 2015-01-05 MED ORDER — INSULIN GLARGINE 100 UNIT/ML ~~LOC~~ SOLN
24.0000 [IU] | Freq: Every day | SUBCUTANEOUS | Status: DC
  Administered 2015-01-06: 24 [IU] via SUBCUTANEOUS
  Filled 2015-01-05 (×3): qty 0.24

## 2015-01-05 MED ORDER — ENOXAPARIN SODIUM 40 MG/0.4ML ~~LOC~~ SOLN
40.0000 mg | SUBCUTANEOUS | Status: DC
  Administered 2015-01-05 – 2015-01-06 (×2): 40 mg via SUBCUTANEOUS
  Filled 2015-01-05 (×2): qty 0.4

## 2015-01-05 MED ORDER — LINAGLIPTIN 5 MG PO TABS
5.0000 mg | ORAL_TABLET | Freq: Every day | ORAL | Status: DC
  Administered 2015-01-06 – 2015-01-07 (×2): 5 mg via ORAL
  Filled 2015-01-05 (×2): qty 1

## 2015-01-05 MED ORDER — STROKE: EARLY STAGES OF RECOVERY BOOK
Freq: Once | Status: AC
  Administered 2015-01-05: 22:00:00

## 2015-01-05 MED ORDER — INSULIN ASPART 100 UNIT/ML ~~LOC~~ SOLN
0.0000 [IU] | Freq: Three times a day (TID) | SUBCUTANEOUS | Status: DC
  Administered 2015-01-06 – 2015-01-07 (×3): 1 [IU] via SUBCUTANEOUS

## 2015-01-05 MED ORDER — POTASSIUM CHLORIDE ER 10 MEQ PO TBCR
20.0000 meq | EXTENDED_RELEASE_TABLET | Freq: Every day | ORAL | Status: DC
  Administered 2015-01-06 – 2015-01-07 (×2): 20 meq via ORAL
  Filled 2015-01-05 (×3): qty 2

## 2015-01-05 MED ORDER — METFORMIN HCL ER 500 MG PO TB24
1000.0000 mg | ORAL_TABLET | Freq: Every day | ORAL | Status: DC
  Administered 2015-01-06 – 2015-01-07 (×2): 1000 mg via ORAL
  Filled 2015-01-05 (×2): qty 2

## 2015-01-05 MED ORDER — AMLODIPINE BESYLATE 5 MG PO TABS
5.0000 mg | ORAL_TABLET | Freq: Every day | ORAL | Status: DC
  Administered 2015-01-06 – 2015-01-07 (×2): 5 mg via ORAL
  Filled 2015-01-05 (×2): qty 1

## 2015-01-05 NOTE — Consult Note (Signed)
Referring Physician: Dr. Alvino Chapel    Chief Complaint: dizziness, falls, unsteady gait  HPI:                                                                                                                                         Jamie Cochran is an 81 y.o. female with a past medical history that is relevant for HTN, DM type 2, hyperlipidemia, non occlusive CAD, chronic congestive heart failure, subcortical right frontal and left thalamic infarcts without residual deficits, hypothyroidism, and hearing loss, comes in for further evaluation of the above stated symptoms. Patient lives with her granddaughter. She said that she has been having off and on dizziness for quite some time, but this morning the dizziness caused her to feel sick of her stomach, sweaty, fell twice, and it is unclear whether or not she experienced loss of consciousness. No associated chest pain, SOD, palpitations, HA, weakness, confusion, double vision, slurred speech, language or vision impairment. Has chronic ringing in her ears.  Noted to have unsteady gait, stumbling in the ED. Patient described her dizziness as " no really a spinning sensation but feeling lightheaded". It occurs mainly when she is standing up and walking, never when she is sitting or lying in bed. It last for a short period of time. Denies change in her medications, recent fever, or medical illnesses. BP 175/61 in the ED. CT brain reviewed by myself showed no acute intracranial abnormality. CBC and metabolic panel unremarkable.  Date last known well: uncertain Time last known well: uncertain tPA Given: no, out of the window   Past Medical History  Diagnosis Date  . Diabetes type 2, uncontrolled   . Non-occlusive coronary artery disease     40% first diagonal stenosis, 50% obtuse marginal stenosis  . Hypertension   . Stroke in 05/2012    deep right frontal perventricular deep white matter infarct + subacute left thalamic lacunar infarct  .  Hyperlipidemia   . Hypothyroidism   . Osteoporosis   . Hearing loss     history of right sided cholesteotoma s/p Right tympanoplasty with ossicular reconstruction. (03/2002)  . Urinary incontinence     s/p surgeyr for ureterocele  . CHF (congestive heart failure)     Past Surgical History  Procedure Laterality Date  . Abdominal hysterectomy    . Cesarean section    . Bladder repair    . Inner ear surgery    . Tonsillectomy and adenoidectomy      Family History  Problem Relation Age of Onset  . Heart disease Father   Family history: no epilepsy, brain tumors, or brain aneurysms. Social History:  reports that she has never smoked. She does not have any smokeless tobacco history on file. She reports that she does not drink alcohol or use illicit drugs.  Allergies:  Allergies  Allergen Reactions  . Sulfa Antibiotics Rash  Medications:                                                                                                                           I have reviewed the patient's current medications.  ROS:                                                                                                                                       History obtained from the patient, daughter, and chart review  General ROS: negative for - chills, fatigue, fever, night sweats, weight gain or weight loss Psychological ROS: negative for - behavioral disorder, hallucinations, memory difficulties, mood swings or suicidal ideation Ophthalmic ROS: negative for - blurry vision, double vision, eye pain or loss of vision ENT ROS: negative for - epistaxis, nasal discharge, oral lesions, sore throat, tinnitus or vertigo Allergy and Immunology ROS: negative for - hives or itchy/watery eyes Hematological and Lymphatic ROS: negative for - bleeding problems, bruising or swollen lymph nodes Endocrine ROS: negative for - galactorrhea, hair pattern changes, polydipsia/polyuria or temperature  intolerance Respiratory ROS: negative for - cough, hemoptysis, shortness of breath or wheezing Cardiovascular ROS: negative for - chest pain, dyspnea on exertion, edema or irregular heartbeat Gastrointestinal ROS: negative for - abdominal pain, diarrhea, hematemesis, nausea/vomiting or stool incontinence Genito-Urinary ROS: negative for - dysuria, hematuria, incontinence or urinary frequency/urgency Musculoskeletal ROS: negative for - joint swelling or muscular weakness Neurological ROS: as noted in HPI Dermatological ROS: negative for rash and skin lesion changes  Physical exam: pleasant female in no apparent distress. Blood pressure 175/61, pulse 59, temperature 97.6 F (36.4 C), resp. rate 12, last menstrual period 02/04/1963, SpO2 97 %. Head: normocephalic. Neck: supple, no bruits, no JVD. Cardiac: no murmurs. Lungs: clear. Abdomen: soft, no tender, no mass. Extremities: no edema. Skin: no rash  Neurologic Examination:  General: Mental Status: Alert, oriented, thought content appropriate.  Speech fluent without evidence of aphasia.  Able to follow 3 step commands without difficulty. Cranial Nerves: II: Discs flat bilaterally; Visual fields grossly normal, pupils equal, round, reactive to light and accommodation III,IV, VI: ptosis not present, mild end of gaze nystagmus mainly left eye V,VII: smile symmetric, facial light touch sensation normal bilaterally VIII: hearing normal bilaterally IX,X: gag reflex present XI: bilateral shoulder shrug XII: midline tongue extension without atrophy or fasciculations Motor: Right : Upper extremity   5/5    Left:     Upper extremity   5/5  Lower extremity   5/5     Lower extremity   5/5 Tone and bulk:normal tone throughout; no atrophy noted Sensory: Pinprick and light touch intact throughout, bilaterally Deep Tendon Reflexes:  Right: Upper  Extremity   Left: Upper extremity   biceps (C-5 to C-6) 2/4   biceps (C-5 to C-6) 2/4 tricep (C7) 2/4    triceps (C7) 2/4 Brachioradialis (C6) 2/4  Brachioradialis (C6) 2/4  Lower Extremity Lower Extremity  quadriceps (L-2 to L-4) 2/4   quadriceps (L-2 to L-4) 2/4 Achilles (S1) 2/4   Achilles (S1) 2/4  Plantars: Right: downgoing   Left: downgoing Cerebellar: normal finger-to-nose,  normal heel-to-shin test Gait:  Deferred due to safety reasons. CV: pulses palpable throughout    Results for orders placed or performed during the hospital encounter of 01/05/15 (from the past 48 hour(s))  CBC with Differential     Status: None   Collection Time: 01/05/15  4:18 PM  Result Value Ref Range   WBC 5.2 4.0 - 10.5 K/uL   RBC 4.53 3.87 - 5.11 MIL/uL   Hemoglobin 12.2 12.0 - 15.0 g/dL   HCT 37.5 36.0 - 46.0 %   MCV 82.8 78.0 - 100.0 fL   MCH 26.9 26.0 - 34.0 pg   MCHC 32.5 30.0 - 36.0 g/dL   RDW 14.4 11.5 - 15.5 %   Platelets 162 150 - 400 K/uL   Neutrophils Relative % 55 43 - 77 %   Neutro Abs 2.8 1.7 - 7.7 K/uL   Lymphocytes Relative 35 12 - 46 %   Lymphs Abs 1.8 0.7 - 4.0 K/uL   Monocytes Relative 9 3 - 12 %   Monocytes Absolute 0.5 0.1 - 1.0 K/uL   Eosinophils Relative 1 0 - 5 %   Eosinophils Absolute 0.1 0.0 - 0.7 K/uL   Basophils Relative 0 0 - 1 %   Basophils Absolute 0.0 0.0 - 0.1 K/uL  Comprehensive metabolic panel     Status: Abnormal   Collection Time: 01/05/15  4:18 PM  Result Value Ref Range   Sodium 137 135 - 145 mmol/L   Potassium 4.2 3.5 - 5.1 mmol/L   Chloride 103 96 - 112 mmol/L   CO2 27 19 - 32 mmol/L   Glucose, Bld 99 70 - 99 mg/dL   BUN 17 6 - 23 mg/dL   Creatinine, Ser 0.81 0.50 - 1.10 mg/dL   Calcium 9.5 8.4 - 10.5 mg/dL   Total Protein 6.7 6.0 - 8.3 g/dL   Albumin 3.9 3.5 - 5.2 g/dL   AST 17 0 - 37 U/L   ALT 12 0 - 35 U/L   Alkaline Phosphatase 54 39 - 117 U/L   Total Bilirubin 0.6 0.3 - 1.2 mg/dL   GFR calc non Af Amer 66 (L) >90 mL/min   GFR  calc Af Amer 76 (L) >90 mL/min  Comment: (NOTE) The eGFR has been calculated using the CKD EPI equation. This calculation has not been validated in all clinical situations. eGFR's persistently <90 mL/min signify possible Chronic Kidney Disease.    Anion gap 7 5 - 15  I-Stat Troponin, ED (not at [facility])     Status: None   Collection Time: 01/05/15  4:28 PM  Result Value Ref Range   Troponin i, poc 0.00 0.00 - 0.08 ng/mL   Comment 3            Comment: Due to the release kinetics of cTnI, a negative result within the first hours of the onset of symptoms does not rule out myocardial infarction with certainty. If myocardial infarction is still suspected, repeat the test at appropriate intervals.    Dg Chest 2 View  01/05/2015   CLINICAL DATA:  Syncope.  Dizziness.  Fall.  EXAM: CHEST  2 VIEW  COMPARISON:  02/17/2014  FINDINGS: The heart size and mediastinal contours are stable. Patient is partially rotated to the left. Both lungs are clear. No evidence of pleural effusion. Mild thoracic spine degenerative changes again seen as well as old upper lumbar vertebral body compression fracture deformity.  IMPRESSION: Stable exam.  No active disease.   Electronically Signed   By: Earle Gell M.D.   On: 01/05/2015 18:46   Ct Head Wo Contrast  01/05/2015   CLINICAL DATA:  Dizziness.  Near syncope.  Frequent falls.  EXAM: CT HEAD WITHOUT CONTRAST  TECHNIQUE: Contiguous axial images were obtained from the base of the skull through the vertex without intravenous contrast.  COMPARISON:  05/29/2012  FINDINGS: Mild low attenuation within the subcortical and periventricular white matter noted consistent with chronic microvascular disease. There is prominence of the sulci and ventricles consistent with brain atrophy. Similar appearance of asymmetric hypodensity within the right insular cortex and adjacent white matter, image 17/series 2. Chronic infarct within the posterior right corona radiata is again noted and  appears unchanged. No new areas of stroke identified. No acute intracranial hemorrhage or mass. No abnormal extra-axial fluid collections noted. The paranasal sinuses are clear. There is opacification of the mastoid air cells bilaterally. The calvarium appears intact.  IMPRESSION: 1. No acute intracranial abnormalities. 2. Chronic infarcts involving the right cerebral hemisphere are again noted. 3. Mastoid air cell opacification.   Electronically Signed   By: Kerby Moors M.D.   On: 01/05/2015 19:09    Assessment: 81 y.o. female with multiple medical problems including episodic dizziness, presents today after developing dizziness/lightheadeness with associated fall x 2, unsteady gait. Neuro-exam is not particularly revealing and CT brain showed no acute abnormality. The pattern of dizziness appears to be not indicative of brainstem involvement but agree with obtaining MRI brain to better elucidate her symptoms. Further neurology work up depending on MRI results. PT. Aspirin if no contraindication. Will continue to follow.  Dorian Pod, MD [facility] [phone]  01/05/2015, 8:10 PM

## 2015-01-05 NOTE — ED Notes (Signed)
Pt presents to department for evaluation of dizziness, near syncope, and frequent falls. Family states she fell out of bed this morning. Denies pain upon arrival. States "I feel nauseated and like I'm going to pass out." no neurological deficits noted. Pt is alert and oriented x4.

## 2015-01-05 NOTE — ED Notes (Addendum)
Pt ambulated with assistance, denies dizziness, however gait is slightly unsteady. Stumbled upon walking back into room.

## 2015-01-05 NOTE — ED Notes (Signed)
This RN called flow to have patients bed assignment changed. Bed assignment to be changed. Patient and family updated.

## 2015-01-05 NOTE — ED Provider Notes (Signed)
CSN: [id number]     Arrival date & time 01/05/15  1607 History   First MD Initiated Contact with Patient 01/05/15 1723     Chief Complaint  Patient presents with  . Dizziness  . Fall  . Near Syncope     (Consider location/radiation/quality/duration/timing/severity/associated sxs/prior Treatment) HPI   81 yo F with PMHx of T2DM, CAD, HTN, CVA, CHF who presents with four falls in the past 2-3 weeks, with 2 falls today due to dizziness and unstable gait. Per report from pt and daughter, pt has been complaining of intermittent dizziness over the past several weeks, which had occurred in the past but is increasing in frequency. Earlier today, she was found down beside her bed sleeping, with room disarray concerning for falling out of bed. She stood up, walked to the kitchen then got "dizzy" again and fell. She again "fell asleep" after the fall and has had difficulty walking since the episode. No headache. No visual changes or speech difficulty. She does have a h/o stroke but denies any prior h/o vertigo. No recent fever or chills. No cough or sputum production.   Past Medical History  Diagnosis Date  . Diabetes type 2, uncontrolled   . Non-occlusive coronary artery disease     40% first diagonal stenosis, 50% obtuse marginal stenosis  . Hypertension   . Stroke in 05/2012    deep right frontal perventricular deep white matter infarct + subacute left thalamic lacunar infarct  . Hyperlipidemia   . Hypothyroidism   . Osteoporosis   . Hearing loss     history of right sided cholesteotoma s/p Right tympanoplasty with ossicular reconstruction. (03/2002)  . Urinary incontinence     s/p surgeyr for ureterocele  . CHF (congestive heart failure)    Past Surgical History  Procedure Laterality Date  . Abdominal hysterectomy    . Cesarean section    . Bladder repair    . Inner ear surgery    . Tonsillectomy and adenoidectomy     Family History  Problem Relation Age of Onset  . Heart disease  Father    History  Substance Use Topics  . Smoking status: Never Smoker   . Smokeless tobacco: Not on file  . Alcohol Use: No   OB History    No data available     Review of Systems  Constitutional: Positive for fatigue. Negative for fever and chills.  HENT: Negative for congestion, rhinorrhea and sore throat.   Eyes: Negative for visual disturbance.  Respiratory: Negative for cough, shortness of breath and wheezing.   Cardiovascular: Negative for chest pain and leg swelling.  Gastrointestinal: Negative for nausea, vomiting, abdominal pain and diarrhea.  Genitourinary: Negative for dysuria and flank pain.  Musculoskeletal: Positive for gait problem. Negative for neck pain and neck stiffness.  Skin: Negative for rash.  Neurological: Positive for dizziness and weakness. Negative for speech difficulty and headaches.      Allergies  Sulfa antibiotics  Home Medications   Prior to Admission medications   Medication Sig Start Date End Date Taking? Authorizing Provider  amLODipine (NORVASC) 5 MG tablet TAKE ONE TABLET BY MOUTH ONCE DAILY 12/14/14   Mary-Margaret Daphine Deutscher, FNP  amoxicillin (AMOXIL) 500 MG capsule Take 1 capsule (500 mg total) by mouth 3 (three) times daily. 09/25/14   Ernestina Penna, MD  aspirin 81 MG tablet Take 81 mg by mouth daily.    Historical Provider, MD  budesonide-formoterol (SYMBICORT) 80-4.5 MCG/ACT inhaler Inhale 2 puffs into the lungs  2 (two) times daily. 09/25/14   Ernestina Pennaonald W Moore, MD  carvedilol (COREG) 25 MG tablet TAKE ONE TABLET BY MOUTH TWICE DAILY WITH MEALS 12/14/14   Mary-Margaret Daphine DeutscherMartin, FNP  Cinnamon 500 MG capsule Take 500 mg by mouth 2 (two) times daily.    Historical Provider, MD  citalopram (CELEXA) 20 MG tablet TAKE ONE TABLET BY MOUTH ONCE DAILY 11/10/14   Mary-Margaret Daphine DeutscherMartin, FNP  cloNIDine (CATAPRES) 0.1 MG tablet Take 1 tablet (0.1 mg total) by mouth 2 (two) times daily. 05/06/13   Mary-Margaret Daphine DeutscherMartin, FNP  furosemide (LASIX) 40 MG tablet  TAKE ONE TABLET BY MOUTH ONCE DAILY 10/13/14   Mary-Margaret Daphine DeutscherMartin, FNP  glucose blood (ONE TOUCH ULTRA TEST) test strip 1 each by Other route 4 (four) times daily - after meals and at bedtime. 12/01/13   Mary-Margaret Daphine DeutscherMartin, FNP  glucose blood (ONE TOUCH ULTRA TEST) test strip Test BS tid. E10.9 12/14/14   Mary-Margaret Daphine DeutscherMartin, FNP  insulin aspart (NOVOLOG) 100 UNIT/ML injection Inject 0-15 Units into the skin 3 (three) times daily with meals. 06/03/12 06/03/13  Emory Nani Skillern McTyre, MD  insulin glargine (LANTUS) 100 UNIT/ML injection Inject 20 Units into the skin at bedtime. 12/12/13   Mary-Margaret Daphine DeutscherMartin, FNP  KOMBIGLYZE XR 2.03-999 MG TB24 TAKE ONE TABLET BY MOUTH TWICE DAILY 09/22/14   Mary-Margaret Daphine DeutscherMartin, FNP  LANTUS 100 UNIT/ML injection INJECT 0.24 MLS (24 UNITS) INTO THE SKIN AT BEDTIME 12/14/14   Ernestina Pennaonald W Moore, MD  levothyroxine (SYNTHROID, LEVOTHROID) 75 MCG tablet TAKE ONE TABLET BY MOUTH ONCE DAILY 10/13/14   Mary-Margaret Daphine DeutscherMartin, FNP  lisinopril (PRINIVIL,ZESTRIL) 20 MG tablet TAKE ONE TABLET BY MOUTH ONCE DAILY 12/14/14   Mary-Margaret Daphine DeutscherMartin, FNP  meloxicam (MOBIC) 15 MG tablet TAKE ONE TABLET BY MOUTH ONCE DAILY 09/16/14   Mary-Margaret Daphine DeutscherMartin, FNP  metolazone (ZAROXOLYN) 2.5 MG tablet Take on po q am for 2 days for severe swelling 05/07/14   Deatra CanterWilliam J Oxford, FNP  mirtazapine (REMERON) 15 MG tablet TAKE ONE TABLET BY MOUTH AT BEDTIME 10/05/14   Mary-Margaret Martin, FNP  MYRBETRIQ 25 MG TB24 tablet TAKE ONE TABLET BY MOUTH ONCE DAILY 12/14/14   Mary-Margaret Daphine DeutscherMartin, FNP  omeprazole (PRILOSEC) 40 MG capsule TAKE ONE CAPSULE BY MOUTH ONCE DAILY 09/08/14   Mary-Margaret Daphine DeutscherMartin, FNP  potassium chloride (K-DUR) 10 MEQ tablet TAKE TWO TABLETS BY MOUTH ONCE DAILY 12/14/14   Mary-Margaret Daphine DeutscherMartin, FNP  Saxagliptin-Metformin 2.03-999 MG TB24 Take by mouth.    Historical Provider, MD   BP 175/61 mmHg  Pulse 59  Temp(Src) 97.6 F (36.4 C)  Resp 12  SpO2 97%  LMP 02/04/1963 Physical Exam  Constitutional:  She is oriented to person, place, and time. She appears well-developed and well-nourished. No distress.  HENT:  Head: Normocephalic and atraumatic.  Mouth/Throat: No oropharyngeal exudate.  Eyes: Conjunctivae are normal. Pupils are equal, round, and reactive to light.  Neck: Normal range of motion. Neck supple.  Cardiovascular: Normal rate and normal heart sounds.  Exam reveals no friction rub.   No murmur heard. Pulmonary/Chest: Effort normal and breath sounds normal. No respiratory distress. She has no wheezes. She has no rales.  Abdominal: Soft. Bowel sounds are normal. She exhibits no distension. There is no tenderness.  Musculoskeletal: She exhibits no edema.  Neurological: She is alert and oriented to person, place, and time.  Skin: Skin is warm. No rash noted.  Nursing note and vitals reviewed.   Neurological Exam:  - Mental Status: Alert and oriented to person, place, and  time. Attention and concentration normal. Speech clear. Recent memory is intact. - Cranial Nerves: EOMI and PERRLA. Bidirectional horizontal nystagmus, worse with left-beating nystagmus when in far left lateral gaze. No vertical or rotational/torsional nystagmus. Facial sensation intact at forehead, maxillary cheek, and chin/mandible bilaterally. No weakness of masticatory muscles. No facial asymmetry or weakness. Hearing impaired bilaterally, with hearing aid on left. Uvula is midline, and palate elevates symmetrically. Normal SCM and trapezius strength. Tongue midline without fasciculations - Motor: Muscle strength 5/5 in proximal and distal UE and LE bilaterally. No pronator drift. Muscle tone normal. - Reflexes: 2+ and symmetrical in all four extremities.  - Sensation: Intact to light touch in upper and lower extremities distally bilaterally.  - Gait: Wide-based and unsteady - Coordination: Normal FTN bilaterally.  ED Course  Procedures (including critical care time) Labs Review Labs Reviewed  COMPREHENSIVE  METABOLIC PANEL - Abnormal; Notable for the following:    GFR calc non Af Amer 66 (*)    GFR calc Af Amer 76 (*)    All other components within normal limits  CBC WITH DIFFERENTIAL/PLATELET  URINALYSIS, ROUTINE W REFLEX MICROSCOPIC  I-STAT TROPOININ, ED    Imaging Review Dg Chest 2 View  01/05/2015   CLINICAL DATA:  Syncope.  Dizziness.  Fall.  EXAM: CHEST  2 VIEW  COMPARISON:  02/17/2014  FINDINGS: The heart size and mediastinal contours are stable. Patient is partially rotated to the left. Both lungs are clear. No evidence of pleural effusion. Mild thoracic spine degenerative changes again seen as well as old upper lumbar vertebral body compression fracture deformity.  IMPRESSION: Stable exam.  No active disease.   Electronically Signed   By: Myles Rosenthal M.D.   On: 01/05/2015 18:46   Ct Head Wo Contrast  01/05/2015   CLINICAL DATA:  Dizziness.  Near syncope.  Frequent falls.  EXAM: CT HEAD WITHOUT CONTRAST  TECHNIQUE: Contiguous axial images were obtained from the base of the skull through the vertex without intravenous contrast.  COMPARISON:  05/29/2012  FINDINGS: Mild low attenuation within the subcortical and periventricular white matter noted consistent with chronic microvascular disease. There is prominence of the sulci and ventricles consistent with brain atrophy. Similar appearance of asymmetric hypodensity within the right insular cortex and adjacent white matter, image 17/series 2. Chronic infarct within the posterior right corona radiata is again noted and appears unchanged. No new areas of stroke identified. No acute intracranial hemorrhage or mass. No abnormal extra-axial fluid collections noted. The paranasal sinuses are clear. There is opacification of the mastoid air cells bilaterally. The calvarium appears intact.  IMPRESSION: 1. No acute intracranial abnormalities. 2. Chronic infarcts involving the right cerebral hemisphere are again noted. 3. Mastoid air cell opacification.    Electronically Signed   By: Signa Kell M.D.   On: 01/05/2015 19:09     EKG Interpretation   Date/Time:  Tuesday January 05 2015 16:16:25 EST Ventricular Rate:  57 PR Interval:  162 QRS Duration: 72 QT Interval:  456 QTC Calculation: 443 R Axis:   47 Text Interpretation:  Sinus bradycardia Cannot rule out Anterior infarct ,  age undetermined Abnormal ECG Confirmed by Rubin Payor  MD, Harrold Donath [phone])  on 01/05/2015 5:24:23 PM      MDM   Final diagnoses:  None    81 yo F with PMHx of T2DM, CAD, HTN, CVA, CHF who presents with four falls in the past 2-3 weeks, with 2 falls today due to dizziness and unstable gait. See HPI above. Exam  as above, pt overall well-appearing and in NAD. Neuro exam remarkable for bidirectional, L>R nystagmus with unsteady gait, but intact FTN, no focal neuro deficits. Remainder as above.  Pt's presentation is most c/w likely peripheral vertigo. Pt has known peripheral ear disease s/p surgery and tympanic membrane removal (per report) on right, with hearing aid on left. However, she has multiple risk factors for central vertigo/CVA and wiill send CT Head. Well out of tPA/intervention window. No othre focal deficits. Will also check screening labs for eval of occult infection, electrolyte abnormalities leading to possible weakness/gait instbaility. No other signs of cerebellar dysfunction.  Labs reviewed as above. CBC with no leukocytosis or anemia. CMP unremarkable. UA without UTI. Troponin negative. CT head shows NAICA, chronic infarcts but no acute changes. CXR clear. Pt requiring 2-person assist with ambulation. Suspect peripheral but cannot r/o central vertigo. Will c/s Neurology and plan to admit. VSS.  Neuro has evaluated as well as Medicine. Will admit for further management and rehab. Pt in agreement.  Clinical Impression: 1. Dizziness     Disposition: Admit  Condition: Stable  Pt seen in conjunction with Dr. Bethann Punches,  MD 01/06/15 1629  Juliet Rude. Rubin Payor, MD 01/09/15 0130

## 2015-01-05 NOTE — Progress Notes (Signed)
Pt admitted to 4N31 from ED.  Pt is alert and oriented and has no complaints.  Orthostatic vital signs taken.  Pt oriented to room and call bell within reach. Will continue to monitor.   Estanislado Emmsshley Schwarz, RN 01/05/2015 9:57 PM

## 2015-01-05 NOTE — ED Notes (Signed)
Pt in x-ray at this time

## 2015-01-05 NOTE — ED Notes (Signed)
Report attempted 

## 2015-01-05 NOTE — H&P (Signed)
[facility] History and Physical  Jamie Cochran ZOX:[id number] DOB: 1929/12/15 DOA: 01/05/2015  Referring physician: ER physician. PCP: Bennie Pierini, FNP  Chief Complaint: Dizziness and fall.  HPI: Jamie Cochran is a 81 y.o. female with history of hypertension, diabetes mellitus type 2, hypothyroidism and CHF was brought to the ER after patient had 2 episodes of dizziness and fall. Patient also had one episode 2 weeks ago. Patient states that she was walking and felt dizzy and suddenly fell but did not lose consciousness or hit her head. In the ER patient was found to have horizontal nystagmus and on trying to make wall patient still feels dizzy. Patient denies any palpitations chest pain shortness of breath nausea vomiting abdominal pain dysuria discharges or diarrhea. EKG shows sinus bradycardia at 57 bpm. On call neurologist has been consulted and patient has been admitted for further observation and management. On exam patient is nonfocal.  Review of Systems: As presented in the history of presenting illness, rest negative.  Past Medical History  Diagnosis Date  . Diabetes type 2, uncontrolled   . Non-occlusive coronary artery disease     40% first diagonal stenosis, 50% obtuse marginal stenosis  . Hypertension   . Stroke in 05/2012    deep right frontal perventricular deep white matter infarct + subacute left thalamic lacunar infarct  . Hyperlipidemia   . Hypothyroidism   . Osteoporosis   . Hearing loss     history of right sided cholesteotoma s/p Right tympanoplasty with ossicular reconstruction. (03/2002)  . Urinary incontinence     s/p surgeyr for ureterocele  . CHF (congestive heart failure)    Past Surgical History  Procedure Laterality Date  . Abdominal hysterectomy    . Cesarean section    . Bladder repair    . Inner ear surgery    . Tonsillectomy and adenoidectomy     Social History:  reports that she has never smoked. She does not have  any smokeless tobacco history on file. She reports that she does not drink alcohol or use illicit drugs. Where does patient live home. Can patient participate in ADLs? Yes.  Allergies  Allergen Reactions  . Sulfa Antibiotics Rash    Family History:  Family History  Problem Relation Age of Onset  . Heart disease Father       Prior to Admission medications   Medication Sig Start Date End Date Taking? Authorizing Provider  amLODipine (NORVASC) 5 MG tablet TAKE ONE TABLET BY MOUTH ONCE DAILY 12/14/14  Yes Mary-Margaret Daphine Deutscher, FNP  aspirin 81 MG tablet Take 81 mg by mouth daily.   Yes Historical Provider, MD  carvedilol (COREG) 25 MG tablet TAKE ONE TABLET BY MOUTH TWICE DAILY WITH MEALS 12/14/14  Yes Mary-Margaret Daphine Deutscher, FNP  Cinnamon 500 MG capsule Take 500 mg by mouth 2 (two) times daily.   Yes Historical Provider, MD  citalopram (CELEXA) 20 MG tablet TAKE ONE TABLET BY MOUTH ONCE DAILY 11/10/14  Yes Mary-Margaret Daphine Deutscher, FNP  furosemide (LASIX) 40 MG tablet TAKE ONE TABLET BY MOUTH ONCE DAILY 10/13/14  Yes Mary-Margaret Daphine Deutscher, FNP  glucose blood (ONE TOUCH ULTRA TEST) test strip 1 each by Other route 4 (four) times daily - after meals and at bedtime. 12/01/13  Yes Mary-Margaret Daphine Deutscher, FNP  KOMBIGLYZE XR 2.03-999 MG TB24 TAKE ONE TABLET BY MOUTH TWICE DAILY Patient taking differently: TAKE ONE TABLET BY MOUTH DAILY 09/22/14  Yes Mary-Margaret Daphine Deutscher, FNP  LANTUS 100 UNIT/ML injection INJECT 0.24 MLS (24 UNITS)  INTO THE SKIN AT BEDTIME 12/14/14  Yes Ernestina Penna, MD  levothyroxine (SYNTHROID, LEVOTHROID) 75 MCG tablet TAKE ONE TABLET BY MOUTH ONCE DAILY 10/13/14  Yes Mary-Margaret Daphine Deutscher, FNP  lisinopril (PRINIVIL,ZESTRIL) 20 MG tablet TAKE ONE TABLET BY MOUTH ONCE DAILY 12/14/14  Yes Mary-Margaret Daphine Deutscher, FNP  metolazone (ZAROXOLYN) 2.5 MG tablet Take on po q am for 2 days for severe swelling Patient taking differently: Take 2.5 mg by mouth daily as needed (swelling).  05/07/14  Yes Deatra Canter, FNP  mirtazapine (REMERON) 15 MG tablet TAKE ONE TABLET BY MOUTH AT BEDTIME Patient taking differently: TAKE ONE HALF TABLET BY MOUTH AT BEDTIME AS NEEDED 10/05/14  Yes Mary-Margaret Daphine Deutscher, FNP  MYRBETRIQ 25 MG TB24 tablet TAKE ONE TABLET BY MOUTH ONCE DAILY 12/14/14  Yes Mary-Margaret Daphine Deutscher, FNP  omeprazole (PRILOSEC) 40 MG capsule TAKE ONE CAPSULE BY MOUTH ONCE DAILY 09/08/14  Yes Mary-Margaret Daphine Deutscher, FNP  potassium chloride (K-DUR) 10 MEQ tablet TAKE TWO TABLETS BY MOUTH ONCE DAILY 12/14/14  Yes Mary-Margaret Daphine Deutscher, FNP  amoxicillin (AMOXIL) 500 MG capsule Take 1 capsule (500 mg total) by mouth 3 (three) times daily. Patient not taking: Reported on 01/05/2015 09/25/14   Ernestina Penna, MD  budesonide-formoterol [facility]) 80-4.5 MCG/ACT inhaler Inhale 2 puffs into the lungs 2 (two) times daily. Patient not taking: Reported on 01/05/2015 09/25/14   Ernestina Penna, MD  cloNIDine (CATAPRES) 0.1 MG tablet Take 1 tablet (0.1 mg total) by mouth 2 (two) times daily. Patient not taking: Reported on 01/05/2015 05/06/13   Mary-Margaret Daphine Deutscher, FNP  glucose blood (ONE TOUCH ULTRA TEST) test strip Test BS tid. E10.9 12/14/14   Mary-Margaret Daphine Deutscher, FNP  insulin aspart (NOVOLOG) 100 UNIT/ML injection Inject 0-15 Units into the skin 3 (three) times daily with meals. 06/03/12 06/03/13  Elfredia Nevins, MD  meloxicam (MOBIC) 15 MG tablet TAKE ONE TABLET BY MOUTH ONCE DAILY Patient not taking: Reported on 01/05/2015 09/16/14   Mary-Margaret Daphine Deutscher, FNP    Physical Exam: Filed Vitals:   01/05/15 1749 01/05/15 1800 01/05/15 1845 01/05/15 2031  BP: 159/75 175/78 175/61   Pulse: 59 58 59   Temp:    98.1 F (36.7 C)  Resp: SpO2: 96% 96% 97%      General:  Moderately built and nourished.  Eyes: Anicteric no pallor. Horizontal nystagmus.  ENT: No discharge from the ears eyes nose and mouth.  Neck: No mass felt.  Cardiovascular: S1-S2 heard.  Respiratory: No rhonchi or  crepitations.  Abdomen: Soft nontender bowel sounds present.  Skin: No rash.  Musculoskeletal: No edema.  Psychiatric: Appears normal.  Neurologic: Alert awake oriented to time place and person. Moves all extremities.  Labs on Admission:  Basic Metabolic Panel:  Recent Labs Lab 01/05/15 1618  NA 137  K 4.2  CL 103  CO2 27  GLUCOSE 99  BUN 17  CREATININE 0.81  CALCIUM 9.5   Liver Function Tests:  Recent Labs Lab 01/05/15 1618  AST 17  ALT 12  ALKPHOS 54  BILITOT 0.6  PROT 6.7  ALBUMIN 3.9   No results for input(s): LIPASE, AMYLASE in the last 168 hours. No results for input(s): AMMONIA in the last 168 hours. CBC:  Recent Labs Lab 01/05/15 1618  WBC 5.2  NEUTROABS 2.8  HGB 12.2  HCT 37.5  MCV 82.8  PLT 162   Cardiac Enzymes: No results for input(s): CKTOTAL, CKMB, CKMBINDEX, TROPONINI in the last 168 hours.  BNP (last  3 results) No results for input(s): BNP in the last 8760 hours.  ProBNP (last 3 results) No results for input(s): PROBNP in the last 8760 hours.  CBG: No results for input(s): GLUCAP in the last 168 hours.  Radiological Exams on Admission: Dg Chest 2 View  01/05/2015   CLINICAL DATA:  Syncope.  Dizziness.  Fall.  EXAM: CHEST  2 VIEW  COMPARISON:  02/17/2014  FINDINGS: The heart size and mediastinal contours are stable. Patient is partially rotated to the left. Both lungs are clear. No evidence of pleural effusion. Mild thoracic spine degenerative changes again seen as well as old upper lumbar vertebral body compression fracture deformity.  IMPRESSION: Stable exam.  No active disease.   Electronically Signed   By: Myles RosenthalJohn  Stahl M.D.   On: 01/05/2015 18:46   Ct Head Wo Contrast  01/05/2015   CLINICAL DATA:  Dizziness.  Near syncope.  Frequent falls.  EXAM: CT HEAD WITHOUT CONTRAST  TECHNIQUE: Contiguous axial images were obtained from the base of the skull through the vertex without intravenous contrast.  COMPARISON:  05/29/2012  FINDINGS:  Mild low attenuation within the subcortical and periventricular white matter noted consistent with chronic microvascular disease. There is prominence of the sulci and ventricles consistent with brain atrophy. Similar appearance of asymmetric hypodensity within the right insular cortex and adjacent white matter, image 17/series 2. Chronic infarct within the posterior right corona radiata is again noted and appears unchanged. No new areas of stroke identified. No acute intracranial hemorrhage or mass. No abnormal extra-axial fluid collections noted. The paranasal sinuses are clear. There is opacification of the mastoid air cells bilaterally. The calvarium appears intact.  IMPRESSION: 1. No acute intracranial abnormalities. 2. Chronic infarcts involving the right cerebral hemisphere are again noted. 3. Mastoid air cell opacification.   Electronically Signed   By: Signa Kellaylor  Stroud M.D.   On: 01/05/2015 19:09    EKG: Independently reviewed. Sinus bradycardia at 57 bpm.  Assessment/Plan Principal Problem:   Dizziness Active Problems:   Hypertension   Hypothyroidism   Diabetes mellitus type 2, controlled   1. Dizziness - patient does have mild bradycardia and is on Coreg. I have placed holding orders for Coreg of heart beat is less than 60 bpm. The patient tends to be bradycardic then Coreg dose may need to be decreased. Check orthostatics. If patient is orthostatic then patient's diuretic dose may need to be withheld. Appreciate neurology consult and at this time MRI brain has been ordered to rule out any intracranial causes. Check 2-D echo. Physical therapy consult. 2. Diabetes mellitus type 2 controlled - continue home medications with close follow-up of CBGs with sliding scale coverage. 3. Hypertension - see #1 regarding bradycardia. 4. History of CHF unspecified - no 2-D echo seen in the system. Check 2-D echo given patient's dizziness. With regarding to diuretic see #1. 5. Hypothyroidism - continue  Synthroid. Check TSH. 6. History of CVA.   DVT Prophylaxis Lovenox.  Code Status: Full code.  Family Communication: Patient's daughter at the bedside.  Disposition Plan: Admit for observation.    Jonae Renshaw N. [facility] Pager [phone].  If 7PM-7AM, please contact night-coverage [email or web address] Password [email or web address] 01/05/2015, 8:58 PM

## 2015-01-06 ENCOUNTER — Inpatient Hospital Stay (HOSPITAL_COMMUNITY): Payer: Medicare Other

## 2015-01-06 ENCOUNTER — Ambulatory Visit: Payer: Medicare Other | Admitting: Nurse Practitioner

## 2015-01-06 DIAGNOSIS — R1314 Dysphagia, pharyngoesophageal phase: Secondary | ICD-10-CM | POA: Diagnosis present

## 2015-01-06 DIAGNOSIS — N390 Urinary tract infection, site not specified: Secondary | ICD-10-CM | POA: Diagnosis present

## 2015-01-06 DIAGNOSIS — R05 Cough: Secondary | ICD-10-CM | POA: Insufficient documentation

## 2015-01-06 DIAGNOSIS — R55 Syncope and collapse: Secondary | ICD-10-CM | POA: Diagnosis not present

## 2015-01-06 DIAGNOSIS — R059 Cough, unspecified: Secondary | ICD-10-CM | POA: Insufficient documentation

## 2015-01-06 LAB — GLUCOSE, CAPILLARY
Glucose-Capillary: 105 mg/dL — ABNORMAL HIGH (ref 70–99)
Glucose-Capillary: 147 mg/dL — ABNORMAL HIGH (ref 70–99)
Glucose-Capillary: 108 mg/dL — ABNORMAL HIGH (ref 70–99)
Glucose-Capillary: 153 mg/dL — ABNORMAL HIGH (ref 70–99)

## 2015-01-06 LAB — LIPID PANEL
CHOL/HDL RATIO: 6.4 ratio
Cholesterol: 192 mg/dL (ref 0–200)
Cholesterol: 186 mg/dL (ref 0–200)
HDL: 30 mg/dL — ABNORMAL LOW (ref >39)
HDL: 27 mg/dL — ABNORMAL LOW (ref >39)
LDL CALC: 126 mg/dL — AB (ref 0–99)
LDL Cholesterol: 108 mg/dL — ABNORMAL HIGH (ref 0–99)
Total CHOL/HDL Ratio: 6.9 RATIO
Triglycerides: 179 mg/dL — ABNORMAL HIGH (ref <150)
Triglycerides: 254 mg/dL — ABNORMAL HIGH (ref <150)
VLDL: 36 mg/dL (ref 0–40)
VLDL: 51 mg/dL — AB (ref 0–40)

## 2015-01-06 LAB — COMPREHENSIVE METABOLIC PANEL
ALBUMIN: 3.6 g/dL (ref 3.5–5.2)
ALT: 11 U/L (ref 0–35)
ANION GAP: 7 (ref 5–15)
AST: 15 U/L (ref 0–37)
Alkaline Phosphatase: 48 U/L (ref 39–117)
BILIRUBIN TOTAL: 0.7 mg/dL (ref 0.3–1.2)
BUN: 15 mg/dL (ref 6–23)
CALCIUM: 9.1 mg/dL (ref 8.4–10.5)
CHLORIDE: 102 mmol/L (ref 96–112)
CO2: 29 mmol/L (ref 19–32)
Creatinine, Ser: 0.72 mg/dL (ref 0.50–1.10)
GFR calc Af Amer: >90 mL/min (ref >90)
GFR calc non Af Amer: 78 mL/min — ABNORMAL LOW (ref >90)
Glucose, Bld: 90 mg/dL (ref 70–99)
Potassium: 3.2 mmol/L — ABNORMAL LOW (ref 3.5–5.1)
Sodium: 138 mmol/L (ref 135–145)
Total Protein: 6.4 g/dL (ref 6.0–8.3)

## 2015-01-06 LAB — TROPONIN I: Troponin I: <0.03 ng/mL (ref <0.031)

## 2015-01-06 LAB — TSH: TSH: 1.084 u[IU]/mL (ref 0.350–4.500)

## 2015-01-06 LAB — MAGNESIUM: MAGNESIUM: 1.3 mg/dL — AB (ref 1.5–2.5)

## 2015-01-06 MED ORDER — POTASSIUM CHLORIDE 20 MEQ/15ML (10%) PO SOLN
40.0000 meq | Freq: Once | ORAL | Status: AC
  Administered 2015-01-06: 40 meq via ORAL
  Filled 2015-01-06: qty 30

## 2015-01-06 MED ORDER — CEFTRIAXONE SODIUM IN DEXTROSE 20 MG/ML IV SOLN
1.0000 g | INTRAVENOUS | Status: DC
  Administered 2015-01-06: 1 g via INTRAVENOUS
  Filled 2015-01-06 (×2): qty 50

## 2015-01-06 NOTE — Progress Notes (Signed)
Addendum PT evaluation--Vestibular Assessment  Significant findings:  -pt reports the dizziness feels like "a churning in my stomache" and then loses her balance; has had similar episodes in the past -in the past has progressed to vomiting (not on 2/23);  -"usually I wake up dizzy" (on 2/23 she noted sensation when first sat EOB in the morning and fell off bed, 2nd            fall happened after breakfast when walking into bathroom);  -denies LOC and denies any sense of movement/vertigo during spells (all testing for BPPV negative--no symptom reproduction or nystagmus) -has gaze-evoked nystagmus with Left gaze (beats to Lt) -has + head thrust test to Rt (indicative of Rt hypofunction, however ? present since Rt ear surgery) -deficient vestibulo-ocular reflex (also ? related to above Rt ear surgery)  Unknown if Rt vestibular hypofunction is new. Unsure if pt will benefit from vestibular rehab if this has been present since 2003 surgery, however may benefit patient and recommend attempting vestibular rehab for Rt hypofunction)    01/06/15 0949  Symptom Behavior  Type of Dizziness Imbalance ("stomach starts churning")  Duration of Dizziness seconds  Aggravating Factors Mornings;Forward bending (when rinsing her hair under tub faucet (on her knees!))  Relieving Factors Rest  Occulomotor Exam  Occulomotor Alignment Abnormal  Spontaneous Absent  Gaze-induced Left beating nystagmus with L gaze  Smooth Pursuits Intact  Vestibulo-Occular Reflex  VOR to Slow Head Movement Positive right  Auditory  Comments deaf Rt ear  Positional Testing  Dix-Hallpike Dix-Hallpike Right;Dix-Hallpike Left  Horizontal Canal Testing Horizontal Canal Right;Horizontal Canal Left  Dix-Hallpike Right  Dix-Hallpike Right Duration negative  Dix-Hallpike Right Symptoms No nystagmus  Dix-Hallpike Left  Dix-Hallpike Left Duration negative  Dix-Hallpike Left Symptoms No nystagmus  Horizontal Canal Right   Horizontal Canal Right Symptoms Normal  Horizontal Canal Left  Horizontal Canal Left Symptoms Normal  Cognition  Cognition Orientation Level Oriented x 4  Positional Sensitivities  Sit to Supine 0  Supine to Sitting 0  Right Hallpike 0  Up from Right Hallpike 0  Up from Left Hallpike 0  Pivot Left in Standing 3  Rolling Right 0  Rolling Left 0  Orthostatics  Orthostatics Comment negative    01/06/2015 Jamie Cochran, PT Pager: [phone]

## 2015-01-06 NOTE — Evaluation (Addendum)
Physical Therapy Evaluation Patient Details Name: Jamie Cochran MRN: [id number] DOB: 11/16/1929 Today's Date: 01/06/2015   History of Present Illness  Adm s/p 2 episodes of dizziness with fall (?LOC, pt denies). +bradycardia on admit MRI brain negative for acute event  PMHx- hearing loss Rt with tympanoplassy and ossicular reconstruction '03, CVA, DM, HTN, CHF    Clinical Impression  Pt admitted with above diagnosis. She denies any sense of vertigo prior to falls. (See other PT note with Vestibular evaluation results). Although some of her descriptions point towards BPPV (spontaneous, lasting seconds, wakes up dizzy), all testing for BPPV was negative (no nystagmus and no symptoms reproduced). Pt currently with functional limitations due to the deficits listed below (see PT Problem List). Pt will benefit from skilled PT to increase their independence and safety with mobility to allow discharge to the venue listed below.       Follow Up Recommendations Home health PT (including Vestibular rehab);Supervision/Assistance - 24 hour    Equipment Recommendations  None recommended by PT    Recommendations for Other Services OT consult     Precautions / Restrictions Precautions Precautions: Fall      Mobility  Bed Mobility Overal bed mobility: Needs Assistance Bed Mobility: Supine to Sit;Sit to Supine     Supine to sit: Min assist Sit to supine: Modified independent (Device/Increase time)   General bed mobility comments: pt exiting bed opposite side from normal for her; required min assist to raise torso and regain balance  Transfers Overall transfer level: Needs assistance Equipment used: Rolling walker (2 wheeled) Transfers: Sit to/from Stand Sit to Stand: Min assist         General transfer comment: vc for safe use of RW (pt has not used previously); assist for balance; x2  Ambulation/Gait Ambulation/Gait assistance: Min assist Ambulation Distance (Feet): 10 Feet  (toileted; 10 ft back to bed) Assistive device: Rolling walker (2 wheeled) Gait Pattern/deviations: Step-through pattern;Decreased stride length;Staggering right;Drifts right/left     General Gait Details: stagger with loss of balance when turning Lt required min assist to recover  Stairs            Wheelchair Mobility    Modified Rankin (Stroke Patients Only) Modified Rankin (Stroke Patients Only) Pre-Morbid Rankin Score: Moderate disability (from prior CVA) Modified Rankin: Moderate disability     Balance Overall balance assessment: History of Falls;Needs assistance Sitting-balance support: Bilateral upper extremity supported;Feet supported Sitting balance-Leahy Scale: Fair Sitting balance - Comments: denied dizziness supine to sit;    Standing balance support: No upper extremity supported;During functional activity Standing balance-Leahy Scale: Fair Standing balance comment: stood at sink washing hands without LOB                             Pertinent Vitals/Pain Pain Assessment: No/denies pain    Home Living Family/patient expects to be discharged to:: Private residence Living Arrangements: Other relatives Available Help at Discharge: Family;Available 24 hours/day Type of Home: House Home Access: Stairs to enter Entrance Stairs-Rails: Right Entrance Stairs-Number of Steps: 3 Home Layout: One level Home Equipment: Walker - 2 wheels;Bedside commode;Wheelchair - manual      Prior Function Level of Independence: Needs assistance   Gait / Transfers Assistance Needed: independent  ADL's / Homemaking Assistance Needed: assist with housework; sits in tub to bathe, gets on her knees to put head under faucet to rinse hair        Hand Dominance  Dominant Hand: Right    Extremity/Trunk Assessment   Upper Extremity Assessment: Defer to OT evaluation           Lower Extremity Assessment: Overall WFL for tasks assessed      Cervical / Trunk  Assessment: Kyphotic  Communication   Communication: HOH  Cognition Arousal/Alertness: Awake/alert Behavior During Therapy: WFL for tasks assessed/performed Overall Cognitive Status: Within Functional Limits for tasks assessed                      General Comments      Exercises        Assessment/Plan    PT Assessment Patient needs continued PT services  PT Diagnosis Difficulty walking   PT Problem List Decreased balance;Decreased mobility;Decreased knowledge of use of DME;Decreased safety awareness  PT Treatment Interventions DME instruction;Gait training;Stair training;Functional mobility training;Therapeutic activities;Therapeutic exercise;Balance training;Patient/family education   PT Goals (Current goals can be found in the Care Plan section) Acute Rehab PT Goals Patient Stated Goal: return home PT Goal Formulation: With patient Time For Goal Achievement: 01/13/15 Potential to Achieve Goals: Good    Frequency Min 3X/week   Barriers to discharge        Co-evaluation               End of Session Equipment Utilized During Treatment: Gait belt Activity Tolerance: Patient tolerated treatment well Patient left: in bed;with call bell/phone within reach;with bed alarm set           Time: 0981-1914 PT Time Calculation (min) (ACUTE ONLY): 38 min   Charges:   PT Evaluation $Initial PT Evaluation Tier I: 1 Procedure PT Treatments $Gait Training: 8-22 mins $Therapeutic Activity: 8-22 mins   PT G Codes:        Jamie Cochran 26-Jan-2015, 10:18 AM Pager [phone]

## 2015-01-06 NOTE — Progress Notes (Signed)
Subjective: No further symptoms while in hospital.  Feels that she is back to baseline.  Objective: Current vital signs: BP 146/67 mmHg  Pulse 66  Temp(Src) 98.2 F (36.8 C) (Oral)  Resp 18  Ht 4\' 8"  (1.422 m)  Wt 64.91 kg (143 lb 1.6 oz)  BMI 32.10 kg/m2  SpO2 93%  LMP 02/04/1963 Vital signs in last 24 hours: Temp:  [97.6 F (36.4 C)-98.8 F (37.1 C)] 98.2 F (36.8 C) (02/24 0600) Pulse Rate:  [57-66] 66 (02/24 0600) Resp:  [12-18] 18 (02/24 0600) BP: (146-196)/(61-85) 146/67 mmHg (02/24 0600) SpO2:  [93 %-99 %] 93 % (02/24 0600) FiO2 (%):  [0 %] 0 % (02/23 2149) Weight:  [64.91 kg (143 lb 1.6 oz)] 64.91 kg (143 lb 1.6 oz) (02/23 2149)  Intake/Output from previous day: 02/23 0701 - 02/24 0700 In: 3 [I.V.:3] Out: -  Intake/Output this shift:   Nutritional status: Diet NPO time specified  Neurologic Exam: General: NAD Mental Status: Alert, oriented, thought content appropriate.  Speech fluent without evidence of aphasia.  Able to follow 3 step commands without difficulty. Cranial Nerves: II: ; Visual fields grossly normal, pupils equal, round, reactive to light and accommodation III,IV, VI: ptosis not present, extra-ocular motions intact bilaterally V,VII: smile symmetric, facial light touch sensation normal bilaterally VIII: hearing decreased IX,X: gag reflex present XI: bilateral shoulder shrug XII: midline tongue extension without atrophy or fasciculations  Motor: Right : Upper extremity   5/5    Left:     Upper extremity   5/5  Lower extremity   5/5     Lower extremity   5/5 Tone and bulk:normal tone throughout; no atrophy noted Sensory: Pinprick and light touch intact throughout, bilaterally Deep Tendon Reflexes:  Right: Upper Extremity   Left: Upper extremity   biceps (C-5 to C-6) 2/4   biceps (C-5 to C-6) 2/4 tricep (C7) 2/4    triceps (C7) 2/4 Brachioradialis (C6) 2/4  Brachioradialis (C6) 2/4  Lower Extremity Lower Extremity  quadriceps (L-2 to  L-4) 2/4   quadriceps (L-2 to L-4) 2/4 Achilles (S1) 2/4   Achilles (S1) 2/4  Plantars: Right: downgoing   Left: downgoing Cerebellar: normal finger-to-nose,  normal heel-to-shin test Gait: normal CV: pulses palpable throughout    Lab Results: Basic Metabolic Panel:  Recent Labs Lab 01/05/15 1618 01/06/15 0500  NA 137 138  K 4.2 3.2*  CL 103 102  CO2 27 29  GLUCOSE 99 90  BUN 17 15  CREATININE 0.81 0.72  CALCIUM 9.5 9.1    Liver Function Tests:  Recent Labs Lab 01/05/15 1618 01/06/15 0500  AST 17 15  ALT 12 11  ALKPHOS 54 48  BILITOT 0.6 0.7  PROT 6.7 6.4  ALBUMIN 3.9 3.6   No results for input(s): LIPASE, AMYLASE in the last 168 hours. No results for input(s): AMMONIA in the last 168 hours.  CBC:  Recent Labs Lab 01/05/15 1618  WBC 5.2  NEUTROABS 2.8  HGB 12.2  HCT 37.5  MCV 82.8  PLT 162    Cardiac Enzymes:  Recent Labs Lab 01/05/15 2230  TROPONINI <0.03    Lipid Panel:  Recent Labs Lab 01/05/15 2230 01/06/15 0430  CHOL 192 186  TRIG 179* 254*  HDL 30* 27*  CHOLHDL 6.4 6.9  VLDL 36 51*  LDLCALC 126* 108*    CBG:  Recent Labs Lab 01/05/15 2159 01/06/15 0650  GLUCAP 98 105*    Microbiology: Results for orders placed or performed during the hospital  encounter of 02/17/14  Urine culture     Status: None   Collection Time: 02/17/14  9:25 PM  Result Value Ref Range Status   Specimen Description URINE, RANDOM  Final   Special Requests NONE  Final   Culture  Setup Time   Final    02/18/2014 08:43 Performed at [facility] Count   Final    >=100,000 COLONIES/ML Performed at [facility]   Culture   Final    ESCHERICHIA COLI Performed at [facility]   Report Status 02/20/2014 FINAL  Final   Organism ID, Bacteria ESCHERICHIA COLI  Final      Susceptibility   Escherichia coli - MIC*    AMPICILLIN >=32 RESISTANT Resistant     CEFAZOLIN 8 SENSITIVE Sensitive     CEFTRIAXONE <=1  SENSITIVE Sensitive     CIPROFLOXACIN >=4 RESISTANT Resistant     GENTAMICIN >=16 RESISTANT Resistant     LEVOFLOXACIN >=8 RESISTANT Resistant     NITROFURANTOIN <=16 SENSITIVE Sensitive     TOBRAMYCIN 4 SENSITIVE Sensitive     TRIMETH/SULFA >=320 RESISTANT Resistant     PIP/TAZO <=4 SENSITIVE Sensitive     * ESCHERICHIA COLI    Coagulation Studies: No results for input(s): LABPROT, INR in the last 72 hours.  Imaging: Dg Chest 2 View  01/05/2015   CLINICAL DATA:  Syncope.  Dizziness.  Fall.  EXAM: CHEST  2 VIEW  COMPARISON:  02/17/2014  FINDINGS: The heart size and mediastinal contours are stable. Patient is partially rotated to the left. Both lungs are clear. No evidence of pleural effusion. Mild thoracic spine degenerative changes again seen as well as old upper lumbar vertebral body compression fracture deformity.  IMPRESSION: Stable exam.  No active disease.   Electronically Signed   By: Myles Rosenthal M.D.   On: 01/05/2015 18:46   Ct Head Wo Contrast  01/05/2015   CLINICAL DATA:  Dizziness.  Near syncope.  Frequent falls.  EXAM: CT HEAD WITHOUT CONTRAST  TECHNIQUE: Contiguous axial images were obtained from the base of the skull through the vertex without intravenous contrast.  COMPARISON:  05/29/2012  FINDINGS: Mild low attenuation within the subcortical and periventricular white matter noted consistent with chronic microvascular disease. There is prominence of the sulci and ventricles consistent with brain atrophy. Similar appearance of asymmetric hypodensity within the right insular cortex and adjacent white matter, image 17/series 2. Chronic infarct within the posterior right corona radiata is again noted and appears unchanged. No new areas of stroke identified. No acute intracranial hemorrhage or mass. No abnormal extra-axial fluid collections noted. The paranasal sinuses are clear. There is opacification of the mastoid air cells bilaterally. The calvarium appears intact.  IMPRESSION: 1. No  acute intracranial abnormalities. 2. Chronic infarcts involving the right cerebral hemisphere are again noted. 3. Mastoid air cell opacification.   Electronically Signed   By: Signa Kell M.D.   On: 01/05/2015 19:09   Mr Brain Wo Contrast  01/06/2015   CLINICAL DATA:  Longstanding intermittent dizziness, acute worsening today, resulting in nausea, fall, possible loss of consciousness. History of diabetes, hypertension, stroke, hyperlipidemia.  EXAM: MRI HEAD WITHOUT CONTRAST  TECHNIQUE: Multiplanar, multiecho pulse sequences of the brain and surrounding structures were obtained without intravenous contrast.  COMPARISON:  CT of the head September 05, 2015 at 1824 hours an MRI of the brain May 29, 2012  FINDINGS: Moderate to severe ventriculomegaly, likely on the basis of global parenchymal brain volume loss  as there is overall commensurate enlargement of cerebral sulci and cerebellar folia, slightly progressed from prior MRI. Patchy supratentorial white matter T2 hyperintensities, including cystic white matter changes RIGHT periventricular, progressed from prior MRI. No midline shift, mass effect or mass lesions. Remote small LEFT thalamus lacunar infarct, new from prior MRI. Tiny inferior basal ganglia perivascular spaces.  No abnormal extra-axial fluid collections. Normal major intracranial vascular flow voids. Status post LEFT ocular lens implant. Trace paranasal sinus mucosal thickening without air-fluid levels. Moderate LEFT mastoid effusions. Postoperative change of RIGHT mastoid air cells, consistent with the wall down mastoidectomy seen on prior CT. No abnormal sellar expansion. No cerebellar tonsillar ectopia. Patient appears edentulous. No suspicious calvarial bone marrow signal.  IMPRESSION: No acute intracranial process, specifically no acute ischemia.  Moderate to severe brain atrophy. Moderate white matter changes may reflect chronic small vessel ischemic disease, progressed from May 29, 2012.    Electronically Signed   By: Awilda Metro   On: 01/06/2015 01:45    Medications:  Scheduled: . amLODipine  5 mg Oral Daily  . aspirin EC  81 mg Oral Daily  . carvedilol  25 mg Oral BID WC  . citalopram  20 mg Oral Daily  . enoxaparin (LOVENOX) injection  40 mg Subcutaneous Q24H  . furosemide  40 mg Oral Daily  . insulin aspart  0-9 Units Subcutaneous TID WC  . insulin glargine  24 Units Subcutaneous QHS  . levothyroxine  75 mcg Oral QAC breakfast  . linagliptin  5 mg Oral Q breakfast   And  . metFORMIN  1,000 mg Oral Q breakfast  . lisinopril  20 mg Oral Daily  . mirabegron ER  25 mg Oral Daily  . pantoprazole  40 mg Oral Daily  . potassium chloride  20 mEq Oral Daily  . sodium chloride  3 mL Intravenous Q12H   Jamie Morn PA-C [facility] [phone]  01/06/2015, 10:22 AM   Patient seen and examined.  Clinical course and management discussed.  Necessary edits performed.  I agree with the above.  Assessment and plan of care developed and discussed below.    Assessment/Plan: 81 y.o. female with multiple medical problems including episodic dizziness, presents yesterday after developing dizziness/lightheadeness with associated fall x 2, unsteady gait. Neuro exam shows no focal findings.  MRI personally reviewed and shows no acute changes. Do not suspect ischemic disease.  Recommendations: 1.  Would consider ENT evaluation as an outpatient 2.  Cardiology may need to evaluate secondary to bradycardia which may be contributing to dizziness/lightheadedness as well.      Thana Farr, MD [facility] [phone]  01/06/2015  11:56 AM

## 2015-01-06 NOTE — Evaluation (Signed)
Clinical/Bedside Swallow Evaluation Patient Details  Name: Jamie Cochran MRN: [id number] Date of Birth: 06/13/1930  Today's Date: 01/06/2015 Time: SLP Start Time (ACUTE ONLY): 1046 SLP Stop Time (ACUTE ONLY): 1057 SLP Time Calculation (min) (ACUTE ONLY): 11 min  Past Medical History:  Past Medical History  Diagnosis Date  . Diabetes type 2, uncontrolled   . Non-occlusive coronary artery disease     40% first diagonal stenosis, 50% obtuse marginal stenosis  . Hypertension   . Stroke in 05/2012    deep right frontal perventricular deep white matter infarct + subacute left thalamic lacunar infarct  . Hyperlipidemia   . Hypothyroidism   . Osteoporosis   . Hearing loss     history of right sided cholesteotoma s/p Right tympanoplasty with ossicular reconstruction. (03/2002)  . Urinary incontinence     s/p surgeyr for ureterocele  . CHF (congestive heart failure)    Past Surgical History:  Past Surgical History  Procedure Laterality Date  . Abdominal hysterectomy    . Cesarean section    . Bladder repair    . Inner ear surgery    . Tonsillectomy and adenoidectomy     HPI:  81 y.o. female with history of CVA 05/2012 (left), hypertension, diabetes mellitus type 2, hypothyroidism and CHF admitted with dizziness and fall. MRI No acute intracranial process, specifically no acute ischemia. Moderate to severe brain atrophy. CXR Stable exam.No active disease. BSE 11/30/11 without indications aspiration, recommended Dys 1 (puree) due to inavailability to dentures.   Assessment / Plan / Recommendation Clinical Impression  No evidence of pharyngeal impairments across consistencies. Slight delayed mastication with solid, therefore recommend Dys 3 diet texture, thin liquids, straws allowed, pills whole in applesauce. Aspiration risk is low, no acute abnormality on MRI. No follow up needed during this stay; pt can upgrade to regular at home with dentures donned. Order for speech-cognitive  assessment; pt screened and appears at baseline and pt agrees.      Aspiration Risk  Mild    Diet Recommendation Dysphagia 3 (Mechanical Soft);Thin liquid   Liquid Administration via: Straw;Cup Medication Administration: Whole meds with liquid Supervision: Patient able to self feed Compensations: Slow rate;Small sips/bites Postural Changes and/or Swallow Maneuvers: Seated upright 90 degrees    Other  Recommendations Oral Care Recommendations: Oral care BID   Follow Up Recommendations  None    Frequency and Duration        Pertinent Vitals/Pain none         Swallow Study Prior Functional Status  Type of Home: House Available Help at Discharge: Family;Available 24 hours/day       Oral/Motor/Sensory Function Overall Oral Motor/Sensory Function: Appears within functional limits for tasks assessed   Ice Chips Ice chips: Not tested   Thin Liquid Thin Liquid: Within functional limits Presentation: Cup;Straw    Nectar Thick Nectar Thick Liquid: Not tested   Honey Thick Honey Thick Liquid: Not tested   Puree Puree: Within functional limits   Solid       Solid: Impaired Oral Phase Functional Implications:  (delay)       Royce MacadamiaLitaker, Ova Gillentine Willis 01/06/2015,11:14 AM   Breck CoonsLisa Willis Lonell FaceLitaker M.Ed [employer] Pager [phone]

## 2015-01-06 NOTE — Progress Notes (Signed)
[facility] PROGRESS NOTE  Jamie Cochran AVW:[id number] DOB: 07/26/1930 DOA: 01/05/2015 PCP: Bennie Pierini, FNP  Summary I have seen and examined Jamie Cochran at bedside and reviewed her chart. Appreciate Neurology. Jamie Cochran is a pleasant 81 y.o. female with history of hypertension, diabetes mellitus type 2, hypothyroidism who was brought to the ER after patient had 2 episodes of dizziness and fall at home, and she was found to have horizontal nystagmus in the ER. Stroke workup, including brain MRI has been unremarkable but she is found to have UTI which likely contributed to her presentation. Will therefore add ceftriaxone and continue neurology recommendations. Will also check vitamin B12 level. Plan Dizziness/UTI (urinary tract infection)/Hypokalemia  Check vitamin B12 level  Ceftriaxone  Replenish potassium  Follow neurology recommendations. Hypertension/Hypothyroidism/Diabetes mellitus type 2, controlled/Dysphagia, pharyngoesophageal phase  No acute changes  Continue current management Code Status: Full Code. Family Communication: None at bedside Disposition Plan: Eventually home, hopefully in the next 24-48 hours   Consultants:  Neurology  Procedures:  None  Antibiotics:  Ceftriaxone 01/06/2015>  HPI/Subjective: Feels okay. No complaints.  Objective: Filed Vitals:   01/06/15 1817  BP: 145/54  Pulse: 72  Temp: 97.9 F (36.6 C)  Resp: 16    Intake/Output Summary (Last 24 hours) at 01/06/15 1838 Last data filed at 01/06/15 1821  Gross per 24 hour  Intake    363 ml  Output      0 ml  Net    363 ml   Filed Weights   01/05/15 2149  Weight: 64.91 kg (143 lb 1.6 oz)    Exam:   General:  Comfortable at rest.  Cardiovascular: S1-S2 normal. No murmurs. Pulse regular.  Respiratory: Good air entry bilaterally. No rhonchi or rales.  Abdomen: Soft and nontender. Normal bowel sounds. No organomegaly.  Musculoskeletal:  No pedal edema   Neurological: Intact  Data Reviewed: Basic Metabolic Panel:  Recent Labs Lab 01/05/15 1618 01/06/15 0500  NA 137 138  K 4.2 3.2*  CL 103 102  CO2 27 29  GLUCOSE 99 90  BUN 17 15  CREATININE 0.81 0.72  CALCIUM 9.5 9.1   Liver Function Tests:  Recent Labs Lab 01/05/15 1618 01/06/15 0500  AST 17 15  ALT 12 11  ALKPHOS 54 48  BILITOT 0.6 0.7  PROT 6.7 6.4  ALBUMIN 3.9 3.6   No results for input(s): LIPASE, AMYLASE in the last 168 hours. No results for input(s): AMMONIA in the last 168 hours. CBC:  Recent Labs Lab 01/05/15 1618  WBC 5.2  NEUTROABS 2.8  HGB 12.2  HCT 37.5  MCV 82.8  PLT 162   Cardiac Enzymes:  Recent Labs Lab 01/05/15 2230  TROPONINI <0.03   BNP (last 3 results) No results for input(s): BNP in the last 8760 hours.  ProBNP (last 3 results) No results for input(s): PROBNP in the last 8760 hours.  CBG:  Recent Labs Lab 01/05/15 2159 01/06/15 0650 01/06/15 1125 01/06/15 1610  GLUCAP 98 105* 147* 108*    No results found for this or any previous visit (from the past 240 hour(s)).   Studies: Dg Chest 2 View  01/05/2015   CLINICAL DATA:  Syncope.  Dizziness.  Fall.  EXAM: CHEST  2 VIEW  COMPARISON:  02/17/2014  FINDINGS: The heart size and mediastinal contours are stable. Patient is partially rotated to the left. Both lungs are clear. No evidence of pleural effusion. Mild thoracic spine degenerative changes again seen as well as old upper  lumbar vertebral body compression fracture deformity.  IMPRESSION: Stable exam.  No active disease.   Electronically Signed   By: Myles Rosenthal M.D.   On: 01/05/2015 18:46   Ct Head Wo Contrast  01/05/2015   CLINICAL DATA:  Dizziness.  Near syncope.  Frequent falls.  EXAM: CT HEAD WITHOUT CONTRAST  TECHNIQUE: Contiguous axial images were obtained from the base of the skull through the vertex without intravenous contrast.  COMPARISON:  05/29/2012  FINDINGS: Mild low attenuation within  the subcortical and periventricular white matter noted consistent with chronic microvascular disease. There is prominence of the sulci and ventricles consistent with brain atrophy. Similar appearance of asymmetric hypodensity within the right insular cortex and adjacent white matter, image 17/series 2. Chronic infarct within the posterior right corona radiata is again noted and appears unchanged. No new areas of stroke identified. No acute intracranial hemorrhage or mass. No abnormal extra-axial fluid collections noted. The paranasal sinuses are clear. There is opacification of the mastoid air cells bilaterally. The calvarium appears intact.  IMPRESSION: 1. No acute intracranial abnormalities. 2. Chronic infarcts involving the right cerebral hemisphere are again noted. 3. Mastoid air cell opacification.   Electronically Signed   By: Signa Kell M.D.   On: 01/05/2015 19:09   Mr Brain Wo Contrast  01/06/2015   CLINICAL DATA:  Longstanding intermittent dizziness, acute worsening today, resulting in nausea, fall, possible loss of consciousness. History of diabetes, hypertension, stroke, hyperlipidemia.  EXAM: MRI HEAD WITHOUT CONTRAST  TECHNIQUE: Multiplanar, multiecho pulse sequences of the brain and surrounding structures were obtained without intravenous contrast.  COMPARISON:  CT of the head September 05, 2015 at 1824 hours an MRI of the brain May 29, 2012  FINDINGS: Moderate to severe ventriculomegaly, likely on the basis of global parenchymal brain volume loss as there is overall commensurate enlargement of cerebral sulci and cerebellar folia, slightly progressed from prior MRI. Patchy supratentorial white matter T2 hyperintensities, including cystic white matter changes RIGHT periventricular, progressed from prior MRI. No midline shift, mass effect or mass lesions. Remote small LEFT thalamus lacunar infarct, new from prior MRI. Tiny inferior basal ganglia perivascular spaces.  No abnormal extra-axial fluid  collections. Normal major intracranial vascular flow voids. Status post LEFT ocular lens implant. Trace paranasal sinus mucosal thickening without air-fluid levels. Moderate LEFT mastoid effusions. Postoperative change of RIGHT mastoid air cells, consistent with the wall down mastoidectomy seen on prior CT. No abnormal sellar expansion. No cerebellar tonsillar ectopia. Patient appears edentulous. No suspicious calvarial bone marrow signal.  IMPRESSION: No acute intracranial process, specifically no acute ischemia.  Moderate to severe brain atrophy. Moderate white matter changes may reflect chronic small vessel ischemic disease, progressed from May 29, 2012.   Electronically Signed   By: Awilda Metro   On: 01/06/2015 01:45    Scheduled Meds: . amLODipine  5 mg Oral Daily  . aspirin EC  81 mg Oral Daily  . carvedilol  25 mg Oral BID WC  . cefTRIAXone (ROCEPHIN)  IV  1 g Intravenous Q24H  . citalopram  20 mg Oral Daily  . enoxaparin (LOVENOX) injection  40 mg Subcutaneous Q24H  . furosemide  40 mg Oral Daily  . insulin aspart  0-9 Units Subcutaneous TID WC  . insulin glargine  24 Units Subcutaneous QHS  . levothyroxine  75 mcg Oral QAC breakfast  . linagliptin  5 mg Oral Q breakfast   And  . metFORMIN  1,000 mg Oral Q breakfast  . lisinopril  20  mg Oral Daily  . mirabegron ER  25 mg Oral Daily  . pantoprazole  40 mg Oral Daily  . potassium chloride  20 mEq Oral Daily  . potassium chloride  40 mEq Oral Once  . sodium chloride  3 mL Intravenous Q12H   Continuous Infusions:    Time spent: 25 minutes    Tsuyako Jolley  [facility] Pager [phone]. If 7PM-7AM, please contact night-coverage at [email or web address], password [email or web address] 01/06/2015, 6:38 PM  LOS: 1 day

## 2015-01-06 NOTE — Evaluation (Addendum)
Occupational Therapy Evaluation Patient Details Name: Jamie Cochran MRN: [id number] DOB: 1930-02-14 Today's Date: 01/06/2015    History of Present Illness Adm s/p 2 episodes of dizziness with fall (?LOC, pt denies). 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 Pt denies dizziness but presents with impaired balance. Pt required min A with use of RW for functional mobility due to balance deficits and [facility]. Pt will benefit from acute OT to address safety and independence with ADLs.     Follow Up Recommendations  Home health OT;Supervision/Assistance - 24 hour    Equipment Recommendations  None recommended by OT    Recommendations for Other Services       Precautions / Restrictions Precautions Precautions: Fall Restrictions Weight Bearing Restrictions: No      Mobility Bed Mobility               General bed mobility comments: Pt sitting in recliner chair when OT arrived.   Transfers Overall transfer level: Needs assistance Equipment used: Rolling walker (2 wheeled) Transfers: Sit to/from Stand Sit to Stand: Min guard         General transfer comment: No physical assist needed to stand from recliner or toilet. VC's for hand placement and safe use of RW.     Balance Overall balance assessment: History of Falls;Needs assistance Sitting-balance support: No upper extremity supported;Feet supported Sitting balance-Leahy Scale: Fair     Standing balance support: Bilateral upper extremity supported;During functional activity Standing balance-Leahy Scale: Fair Standing balance comment: stood at sink for hand washing without LOB. Requiers  UE support for dynamic balance.                             ADL Overall ADL's : Needs assistance/impaired Eating/Feeding: Independent;Sitting   Grooming: Min guard;Standing   Upper Body Bathing: Set up;Sitting   Lower Body Bathing: Min guard;Sit to/from stand   Upper Body Dressing : Set up;Sitting   Lower Body Dressing: Min guard;Sit to/from stand Lower Body Dressing Details (indicate cue type and reason): pt able to reach Bil feet and doff/don socks. Min guard to stand Toilet Transfer: Minimal assistance;Ambulation;RW;Comfort height toilet Toilet Transfer Details (indicate cue type and reason): min a due to balance deficits when walking Toileting- ArchitectClothing Manipulation and Hygiene: Min guard;Sit to/from stand Toileting - Clothing Manipulation Details (indicate cue type and reason): pt changed pad, which was soaked with urine. Provided with washcloth, pt cleaned perineal area. Min guard for sit<>stand   Tub/Shower Transfer Details (indicate cue type and reason): Discussed alternatives to getting down into bathtub, including use of shower seat/3N1 but pt declines and states that "this is how I do it." Functional mobility during ADLs: Minimal assistance;Rolling walker General ADL Comments: Pt denies any dizziness at this time. Reports that she feels at baseline. Pt demonstrates balance deficits. Pt did not recall working with PT today, but did recall SLP. Pt demonstrates unsafe use of RW by picking it up and carrying it over boundaries.      Vision  Pt reports no change from baseline.           Pertinent Vitals/Pain Pain Assessment: No/denies pain  Hand Dominance Right   Extremity/Trunk Assessment Upper Extremity Assessment Upper Extremity Assessment: Overall WFL for tasks assessed   Lower Extremity Assessment Lower Extremity Assessment: Overall WFL for tasks assessed   Cervical / Trunk Assessment Cervical / Trunk Assessment: Kyphotic   Communication  Communication Communication: HOH   Cognition Arousal/Alertness: Awake/alert Behavior During Therapy: WFL for tasks assessed/performed Overall Cognitive Status: No family/caregiver present to determine baseline cognitive functioning (mildly impulsive) Area of Impairment: Memory;Following commands;Safety/judgement     Memory: Decreased short-term memory Following Commands: Follows one step commands with increased time Safety/Judgement: Decreased awareness of safety;Decreased awareness of deficits                    Home Living Family/patient expects to be discharged to:: Private residence Living Arrangements: Other relatives (grandchildren) Available Help at Discharge: Family;Available 24 hours/day Type of Home: House Home Access: Stairs to enter [employer] of Steps: 3 Entrance Stairs-Rails: Right Home Layout: One level     Bathroom Shower/Tub: Tub/shower unit Shower/tub characteristics: Curtain Firefighter: Standard     Home Equipment: Environmental consultant - 2 wheels;Bedside commode;Wheelchair - manual          Prior Functioning/Environment Level of Independence: Needs assistance  Gait / Transfers Assistance Needed: independent ADL's / Homemaking Assistance Needed: assist with housework; sits in tub to bathe, gets on her knees to put head under faucet to rinse hair        OT Diagnosis: Generalized weakness   OT Problem List: Decreased activity tolerance;Impaired balance (sitting and/or standing);Decreased safety awareness;Decreased knowledge of use of DME or AE   OT Treatment/Interventions: Self-care/ADL training;Therapeutic exercise;Energy conservation;DME and/or AE instruction;Therapeutic activities;Patient/family education;Balance training;Cognitive remediation/compensation    OT Goals(Current goals can be found in the care plan section) Acute Rehab OT Goals Patient Stated Goal: to get back home OT Goal Formulation: With patient Time For Goal Achievement:  01/20/15 Potential to Achieve Goals: Good ADL Goals Pt Will Perform Grooming: with modified independence;standing Pt Will Perform Lower Body Bathing: with modified independence;sit to/from stand Pt Will Perform Lower Body Dressing: with modified independence;sit to/from stand Pt Will Transfer to Toilet: with modified independence;ambulating Pt Will Perform Toileting - Clothing Manipulation and hygiene: with modified independence;sit to/from stand Pt Will Perform Tub/Shower Transfer: Tub transfer;with modified independence;ambulating;rolling walker  OT Frequency: Min 1X/week    End of Session Equipment Utilized During Treatment: Gait belt;Rolling walker  Activity Tolerance: Patient tolerated treatment well Patient left: in chair;with call bell/phone within reach;with family/visitor present;with chair alarm set   Time: 1339-1401 OT Time Calculation (min): 22 min Charges:  OT General Charges $OT Visit: 1 Procedure OT Evaluation $Initial OT Evaluation Tier I: 1 Procedure G-Codes:     2015-01-24 1400  OT G-codes **NOT FOR INPATIENT CLASS**  Functional Assessment Tool Used clinical judgement  Functional Limitation Self care  Self Care Current Status (Y8657) CI  Self Care Goal Status (Q4696) CH    Nena Jordan M 01-24-2015, 2:26 PM  Carney Living, OTR/L Occupational Therapist [phone] (pager)

## 2015-01-06 NOTE — Progress Notes (Signed)
  Echocardiogram 2D Echocardiogram has been performed.  Arvil ChacoFoster, Bhakti Labella 01/06/2015, 1:52 PM

## 2015-01-07 DIAGNOSIS — R1314 Dysphagia, pharyngoesophageal phase: Secondary | ICD-10-CM | POA: Diagnosis present

## 2015-01-07 DIAGNOSIS — H5509 Other forms of nystagmus: Secondary | ICD-10-CM | POA: Diagnosis present

## 2015-01-07 DIAGNOSIS — Z91013 Allergy to seafood: Secondary | ICD-10-CM | POA: Diagnosis not present

## 2015-01-07 DIAGNOSIS — Y92009 Unspecified place in unspecified non-institutional (private) residence as the place of occurrence of the external cause: Secondary | ICD-10-CM | POA: Diagnosis not present

## 2015-01-07 DIAGNOSIS — H9190 Unspecified hearing loss, unspecified ear: Secondary | ICD-10-CM | POA: Diagnosis present

## 2015-01-07 DIAGNOSIS — Z794 Long term (current) use of insulin: Secondary | ICD-10-CM | POA: Diagnosis not present

## 2015-01-07 DIAGNOSIS — E119 Type 2 diabetes mellitus without complications: Secondary | ICD-10-CM | POA: Diagnosis present

## 2015-01-07 DIAGNOSIS — I509 Heart failure, unspecified: Secondary | ICD-10-CM | POA: Diagnosis present

## 2015-01-07 DIAGNOSIS — R110 Nausea: Secondary | ICD-10-CM | POA: Diagnosis present

## 2015-01-07 DIAGNOSIS — E039 Hypothyroidism, unspecified: Secondary | ICD-10-CM | POA: Diagnosis present

## 2015-01-07 DIAGNOSIS — E785 Hyperlipidemia, unspecified: Secondary | ICD-10-CM | POA: Diagnosis present

## 2015-01-07 DIAGNOSIS — R262 Difficulty in walking, not elsewhere classified: Secondary | ICD-10-CM | POA: Diagnosis present

## 2015-01-07 DIAGNOSIS — I2510 Atherosclerotic heart disease of native coronary artery without angina pectoris: Secondary | ICD-10-CM | POA: Diagnosis present

## 2015-01-07 DIAGNOSIS — Z79899 Other long term (current) drug therapy: Secondary | ICD-10-CM | POA: Diagnosis not present

## 2015-01-07 DIAGNOSIS — Z7982 Long term (current) use of aspirin: Secondary | ICD-10-CM | POA: Diagnosis not present

## 2015-01-07 DIAGNOSIS — N390 Urinary tract infection, site not specified: Secondary | ICD-10-CM | POA: Diagnosis present

## 2015-01-07 DIAGNOSIS — E876 Hypokalemia: Secondary | ICD-10-CM | POA: Diagnosis present

## 2015-01-07 DIAGNOSIS — M810 Age-related osteoporosis without current pathological fracture: Secondary | ICD-10-CM | POA: Diagnosis present

## 2015-01-07 DIAGNOSIS — R42 Dizziness and giddiness: Secondary | ICD-10-CM | POA: Diagnosis present

## 2015-01-07 DIAGNOSIS — I10 Essential (primary) hypertension: Secondary | ICD-10-CM | POA: Diagnosis present

## 2015-01-07 DIAGNOSIS — W06XXXA Fall from bed, initial encounter: Secondary | ICD-10-CM | POA: Diagnosis present

## 2015-01-07 LAB — VITAMIN B12: Vitamin B-12: 524 pg/mL (ref 211–911)

## 2015-01-07 LAB — COMPREHENSIVE METABOLIC PANEL
ALBUMIN: 3.4 g/dL — AB (ref 3.5–5.2)
ALK PHOS: 56 U/L (ref 39–117)
ALT: 12 U/L (ref 0–35)
ANION GAP: 12 (ref 5–15)
AST: 19 U/L (ref 0–37)
BUN: 19 mg/dL (ref 6–23)
CALCIUM: 9.7 mg/dL (ref 8.4–10.5)
CO2: 28 mmol/L (ref 19–32)
Chloride: 98 mmol/L (ref 96–112)
Creatinine, Ser: 1.01 mg/dL (ref 0.50–1.10)
GFR calc Af Amer: 58 mL/min — ABNORMAL LOW (ref >90)
GFR calc non Af Amer: 50 mL/min — ABNORMAL LOW (ref >90)
Glucose, Bld: 116 mg/dL — ABNORMAL HIGH (ref 70–99)
POTASSIUM: 3.7 mmol/L (ref 3.5–5.1)
SODIUM: 138 mmol/L (ref 135–145)
Total Bilirubin: 0.4 mg/dL (ref 0.3–1.2)
Total Protein: 6.5 g/dL (ref 6.0–8.3)

## 2015-01-07 LAB — CBC WITH DIFFERENTIAL/PLATELET
Basophils Absolute: 0 10*3/uL (ref 0.0–0.1)
Basophils Relative: 0 % (ref 0–1)
EOS PCT: 1 % (ref 0–5)
Eosinophils Absolute: 0.1 10*3/uL (ref 0.0–0.7)
HCT: 35.7 % — ABNORMAL LOW (ref 36.0–46.0)
Hemoglobin: 11.5 g/dL — ABNORMAL LOW (ref 12.0–15.0)
LYMPHS PCT: 48 % — AB (ref 12–46)
Lymphs Abs: 2.7 10*3/uL (ref 0.7–4.0)
MCH: 27.1 pg (ref 26.0–34.0)
MCHC: 32.2 g/dL (ref 30.0–36.0)
MCV: 84 fL (ref 78.0–100.0)
Monocytes Absolute: 0.6 10*3/uL (ref 0.1–1.0)
Monocytes Relative: 11 % (ref 3–12)
NEUTROS ABS: 2.3 10*3/uL (ref 1.7–7.7)
Neutrophils Relative %: 40 % — ABNORMAL LOW (ref 43–77)
PLATELETS: 167 10*3/uL (ref 150–400)
RBC: 4.25 MIL/uL (ref 3.87–5.11)
RDW: 14.6 % (ref 11.5–15.5)
WBC: 5.7 10*3/uL (ref 4.0–10.5)

## 2015-01-07 LAB — GLUCOSE, CAPILLARY
GLUCOSE-CAPILLARY: 122 mg/dL — AB (ref 70–99)
GLUCOSE-CAPILLARY: 110 mg/dL — AB (ref 70–99)
Glucose-Capillary: 134 mg/dL — ABNORMAL HIGH (ref 70–99)

## 2015-01-07 LAB — MAGNESIUM: Magnesium: 1.4 mg/dL — ABNORMAL LOW (ref 1.5–2.5)

## 2015-01-07 LAB — HEMOGLOBIN A1C
Hgb A1c MFr Bld: 6.8 % — ABNORMAL HIGH (ref 4.8–5.6)
Mean Plasma Glucose: 148 mg/dL

## 2015-01-07 MED ORDER — CEPHALEXIN 500 MG PO CAPS: 500.0000 mg | ORAL_CAPSULE | Freq: Three times a day (TID) | ORAL | Status: DC

## 2015-01-07 MED ORDER — CEPHALEXIN 500 MG PO CAPS
500.0000 mg | ORAL_CAPSULE | Freq: Once | ORAL | Status: AC
  Administered 2015-01-07: 500 mg via ORAL
  Filled 2015-01-07: qty 1

## 2015-01-07 NOTE — Progress Notes (Signed)
Physical Therapy Treatment Patient Details Name: Jamie Cochran MRN: [id number] DOB: September 12, 1930 Today's Date: 01/07/2015    History of Present Illness Adm s/p 2 episodes of dizziness with fall (?LOC, pt denies). 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 Static standing activities and walking without loss of balance; higher level balance activities did cause LOB x 2 requiring min assist to prevent fall. Pt's gaze-evoked nystagmus to the Lt and decr VOR still present, however pt asymptomatic. Likely pt has accomodated to Rt hypofunction over past many years and these episodes either exclusively due to UTI or pt with relapse of hypofunction symptoms because of UTI/acute illness. Continue to recommend HHPT.   Follow Up Recommendations  Home health PT;Supervision/Assistance - 24 hour     Equipment Recommendations  None recommended by PT    Recommendations for Other Services OT consult     Precautions / Restrictions Precautions Precautions: Fall    Mobility  Bed Mobility Overal bed mobility: Modified Independent             General bed mobility comments: up on EOB on arrival, HOB flat  Transfers Overall transfer level: Needs assistance Equipment used: None Transfers: Sit to/from Stand Sit to Stand: Min guard         General transfer comment: x2; no imbalance denies dizziness  Ambulation/Gait Ambulation/Gait assistance: Min guard Ambulation Distance (Feet): 150 Feet Assistive device: None Gait Pattern/deviations: Step-through pattern;Decreased stride length   Gait velocity interpretation: at or above normal speed for age/gender General Gait Details: no LOB even with head turns, directional turns, sudden stops   Stairs Stairs: Yes Stairs assistance: Min assist Stair Management: Two rails;Alternating  pattern;Forwards Number of Stairs: 5 General stair comments: no unsteadiness  Wheelchair Mobility    Modified Rankin (Stroke Patients Only) Modified Rankin (Stroke Patients Only) Pre-Morbid Rankin Score: Moderate disability (from prior CVA) Modified Rankin: Moderate disability     Balance Overall balance assessment: History of Falls;Needs assistance   Sitting balance-Leahy Scale: Good Sitting balance - Comments: able to reach to feet without imbalance; + back pain since fall   Standing balance support: No upper extremity supported Standing balance-Leahy Scale: Fair Standing balance comment: dynamic step taps alternating feet at 7" step pt with LOB to her Left when stepping RLE x 2 (on ~6th repetitrion both times)         Rhomberg - Eyes Opened: 30 Rhomberg - Eyes Closed: 5 (incr sway and opens eyes x 2 attempts)   High Level Balance Comments: decr Rhomberg with eyes closed likely due to Rt hypofunction from prior ear surgery; eyes opened she seems to have compensated    Cognition Arousal/Alertness: Awake/alert Behavior During Therapy: WFL for tasks assessed/performed Overall Cognitive Status: Within Functional Limits for tasks assessed                      Exercises      General Comments General comments (skin integrity, edema, etc.): incontinent of urine as walking to bathroom; uses "pullup" at home      Pertinent Vitals/Pain Pain Assessment: No/denies pain    Home Living                      Prior Function            PT Goals (current goals  can now be found in the care plan section) Acute Rehab PT Goals Patient Stated Goal: return home PT Goal Formulation: With patient Time For Goal Achievement: 01/13/15 Potential to Achieve Goals: Good    Frequency  Min 3X/week    PT Plan      Co-evaluation             End of Session Equipment Utilized During Treatment: Gait belt Activity Tolerance: Patient tolerated treatment  well Patient left: in bed;with call bell/phone within reach;with bed alarm set     Time: 1005-1027 PT Time Calculation (min) (ACUTE ONLY): 22 min  Charges:  $Gait Training: 8-22 mins                    G Codes:      Cass Vandermeulen 01/07/2015, 10:38 AM Pager [phone]

## 2015-01-07 NOTE — Discharge Summary (Addendum)
Jamie Cochran, is a 81 y.o. female  DOB [birth year]  MRN [id number].  Admission date:  01/05/2015  Admitting Physician  Eduard Clos, MD  Discharge Date:  01/07/2015   Primary MD  Bennie Pierini, FNP  Recommendations for primary care physician for things to follow:  Please follow complete resolution of UTI. Refer to ENT if gait instability persists despite treatment of UTI.   Admission Diagnosis   Dizziness;Nausea   Discharge Diagnosis  Dizziness;Nausea   Active Problems:   Hypertension   Hypothyroidism   Diabetes mellitus type 2, controlled   Dysphagia, pharyngoesophageal phase      Hospital Course  Jamie Cochran is a pleasant 81 y.o. female with history of hypertension, diabetes mellitus type 2, hypothyroidism who was brought to the ER after patient had 2 episodes of dizziness and a fall at home, and she was found to have horizontal nystagmus in the ER. Stroke workup, including brain MRI was unremarkable but she was found to have UTI which likely contributed to her clinical presentation. She was started on Ceftriaxone and was also seen by neurology who recommended outpatient ENT referral as necessary if dizziness continued. Of note is that her vitamin B12 level was normal. She will d/c on Keflex to complete 5 more days. She is discharged in stable position. Will request HHS including PT/RN/Aide. Called patient's granddaughter Eugene Garnet at [phone], to give her a heads up and left message for her to have an update from me before her grandma is discharged.   Discharge Condition Stable.  Consults obtained  Neurology  Follow UP PCP ?ENT as necessary.   Discharge Instructions  and  Discharge Medications  Discharge Instructions    Diet - low sodium heart healthy     Complete by:  As directed      Diet Carb Modified    Complete by:  As directed      Increase activity slowly    Complete by:  As directed             Medication List    STOP taking these medications        amoxicillin 500 MG capsule  Commonly known as:  AMOXIL     cloNIDine 0.1 MG tablet  Commonly known as:  CATAPRES     meloxicam 15 MG tablet  Commonly known as:  MOBIC      TAKE these medications        amLODipine 5 MG tablet  Commonly known as:  NORVASC  TAKE ONE TABLET BY MOUTH ONCE DAILY     aspirin 81 MG tablet  Take 81 mg by mouth daily.     budesonide-formoterol 80-4.5 MCG/ACT inhaler  Commonly known as:  SYMBICORT  Inhale 2 puffs into the lungs 2 (two) times daily.     carvedilol 25 MG tablet  Commonly known as:  COREG  TAKE ONE TABLET BY MOUTH TWICE DAILY WITH MEALS     cephALEXin 500 MG capsule  Commonly known as:  KEFLEX  Take  1 capsule (500 mg total) by mouth 3 (three) times daily.     Cinnamon 500 MG capsule  Take 500 mg by mouth 2 (two) times daily.     citalopram 20 MG tablet  Commonly known as:  CELEXA  TAKE ONE TABLET BY MOUTH ONCE DAILY     furosemide 40 MG tablet  Commonly known as:  LASIX  TAKE ONE TABLET BY MOUTH ONCE DAILY     glucose blood test strip  Commonly known as:  ONE TOUCH ULTRA TEST  1 each by Other route 4 (four) times daily - after meals and at bedtime.     glucose blood test strip  Commonly known as:  ONE TOUCH ULTRA TEST  Test BS tid. E10.9     insulin aspart 100 UNIT/ML injection  Commonly known as:  novoLOG  Inject 0-15 Units into the skin 3 (three) times daily with meals.     KOMBIGLYZE XR 2.03-999 MG Tb24  Generic drug:  Saxagliptin-Metformin  TAKE ONE TABLET BY MOUTH TWICE DAILY     LANTUS 100 UNIT/ML injection  Generic drug:  insulin glargine  INJECT 0.24 MLS (24 UNITS) INTO THE SKIN AT BEDTIME     levothyroxine 75 MCG tablet  Commonly known as:  SYNTHROID, LEVOTHROID  TAKE ONE TABLET BY MOUTH ONCE  DAILY     lisinopril 20 MG tablet  Commonly known as:  PRINIVIL,ZESTRIL  TAKE ONE TABLET BY MOUTH ONCE DAILY     metolazone 2.5 MG tablet  Commonly known as:  ZAROXOLYN  Take on po q am for 2 days for severe swelling     mirtazapine 15 MG tablet  Commonly known as:  REMERON  TAKE ONE TABLET BY MOUTH AT BEDTIME     MYRBETRIQ 25 MG Tb24 tablet  Generic drug:  mirabegron ER  TAKE ONE TABLET BY MOUTH ONCE DAILY     omeprazole 40 MG capsule  Commonly known as:  PRILOSEC  TAKE ONE CAPSULE BY MOUTH ONCE DAILY     potassium chloride 10 MEQ tablet  Commonly known as:  K-DUR  TAKE TWO TABLETS BY MOUTH ONCE DAILY        Diet and Activity recommendation: See Discharge Instructions above  Major procedures and Radiology Reports - PLEASE review detailed and final reports for all details, in brief -    Dg Chest 2 View  01/05/2015   CLINICAL DATA:  Syncope.  Dizziness.  Fall.  EXAM: CHEST  2 VIEW  COMPARISON:  02/17/2014  FINDINGS: The heart size and mediastinal contours are stable. Patient is partially rotated to the left. Both lungs are clear. No evidence of pleural effusion. Mild thoracic spine degenerative changes again seen as well as old upper lumbar vertebral body compression fracture deformity.  IMPRESSION: Stable exam.  No active disease.   Electronically Signed   By: Myles Rosenthal M.D.   On: 01/05/2015 18:46   Ct Head Wo Contrast  01/05/2015   CLINICAL DATA:  Dizziness.  Near syncope.  Frequent falls.  EXAM: CT HEAD WITHOUT CONTRAST  TECHNIQUE: Contiguous axial images were obtained from the base of the skull through the vertex without intravenous contrast.  COMPARISON:  05/29/2012  FINDINGS: Mild low attenuation within the subcortical and periventricular white matter noted consistent with chronic microvascular disease. There is prominence of the sulci and ventricles consistent with brain atrophy. Similar appearance of asymmetric hypodensity within the right insular cortex and adjacent  white matter, image 17/series 2. Chronic infarct within the posterior right corona  radiata is again noted and appears unchanged. No new areas of stroke identified. No acute intracranial hemorrhage or mass. No abnormal extra-axial fluid collections noted. The paranasal sinuses are clear. There is opacification of the mastoid air cells bilaterally. The calvarium appears intact.  IMPRESSION: 1. No acute intracranial abnormalities. 2. Chronic infarcts involving the right cerebral hemisphere are again noted. 3. Mastoid air cell opacification.   Electronically Signed   By: Signa Kellaylor  Stroud M.D.   On: 01/05/2015 19:09   Mr Brain Wo Contrast  01/06/2015   CLINICAL DATA:  Longstanding intermittent dizziness, acute worsening today, resulting in nausea, fall, possible loss of consciousness. History of diabetes, hypertension, stroke, hyperlipidemia.  EXAM: MRI HEAD WITHOUT CONTRAST  TECHNIQUE: Multiplanar, multiecho pulse sequences of the brain and surrounding structures were obtained without intravenous contrast.  COMPARISON:  CT of the head September 05, 2015 at 1824 hours an MRI of the brain May 29, 2012  FINDINGS: Moderate to severe ventriculomegaly, likely on the basis of global parenchymal brain volume loss as there is overall commensurate enlargement of cerebral sulci and cerebellar folia, slightly progressed from prior MRI. Patchy supratentorial white matter T2 hyperintensities, including cystic white matter changes RIGHT periventricular, progressed from prior MRI. No midline shift, mass effect or mass lesions. Remote small LEFT thalamus lacunar infarct, new from prior MRI. Tiny inferior basal ganglia perivascular spaces.  No abnormal extra-axial fluid collections. Normal major intracranial vascular flow voids. Status post LEFT ocular lens implant. Trace paranasal sinus mucosal thickening without air-fluid levels. Moderate LEFT mastoid effusions. Postoperative change of RIGHT mastoid air cells, consistent with the wall  down mastoidectomy seen on prior CT. No abnormal sellar expansion. No cerebellar tonsillar ectopia. Patient appears edentulous. No suspicious calvarial bone marrow signal.  IMPRESSION: No acute intracranial process, specifically no acute ischemia.  Moderate to severe brain atrophy. Moderate white matter changes may reflect chronic small vessel ischemic disease, progressed from May 29, 2012.   Electronically Signed   By: Awilda Metroourtnay  Bloomer   On: 01/06/2015 01:45    Micro Results   No results found for this or any previous visit (from the past 240 hour(s)).     Today   Subjective:   Jamie Bucyhyllis Roe today has no headache,no chest abdominal pain,no new weakness tingling or numbness, feels much better wants to go home today.   Objective:   Blood pressure 135/55, pulse 59, temperature 97.8 F (36.6 C), temperature source Oral, resp. rate 17, height 4\' 8"  (1.422 m), weight 64.91 kg (143 lb 1.6 oz), last menstrual period 02/04/1963, SpO2 99 %.   Intake/Output Summary (Last 24 hours) at 01/07/15 1644 Last data filed at 01/07/15 1248  Gross per 24 hour  Intake    843 ml  Output      0 ml  Net    843 ml    Exam Awake Alert, Oriented x 3, No new F.N deficits, Normal affect Caro.AT,PERRAL Supple Neck,No JVD, No cervical lymphadenopathy appriciated.  Symmetrical Chest wall movement, Good air movement bilaterally, CTAB RRR,No Gallops,Rubs or new Murmurs, No Parasternal Heave +ve B.Sounds, Abd Soft, Non tender, No organomegaly appriciated, No rebound -guarding or rigidity. No Cyanosis, Clubbing or edema, No new Rash or bruise  Data Review   CBC w Diff: Lab Results  Component Value Date   WBC 5.7 01/07/2015   WBC 6.5 09/25/2014   WBC 6.9 01/06/2013   HGB 11.5* 01/07/2015   HGB 11.0* 09/25/2014   HCT 35.7* 01/07/2015   HCT 35.0* 09/25/2014   PLT 167  01/07/2015   LYMPHOPCT 48* 01/07/2015   MONOPCT 11 01/07/2015   EOSPCT 1 01/07/2015   BASOPCT 0 01/07/2015    CMP: Lab Results    Component Value Date   NA 138 01/07/2015   NA 141 08/27/2014   K 3.7 01/07/2015   CL 98 01/07/2015   CO2 28 01/07/2015   BUN 19 01/07/2015   BUN 24 08/27/2014   CREATININE 1.01 01/07/2015   CREATININE 0.73 05/13/2013   CREATININE 0.8 01/06/2013   GLU 55 01/06/2013   PROT 6.5 01/07/2015   PROT 6.5 08/27/2014   ALBUMIN 3.4* 01/07/2015   BILITOT 0.4 01/07/2015   ALKPHOS 56 01/07/2015   AST 19 01/07/2015   ALT 12 01/07/2015  .   Total Time in preparing paper work, data evaluation and todays exam - 35 minutes  Kasie Leccese M.D on 01/07/2015 at 4:44 PM  [facility] Office  [phone]

## 2015-01-07 NOTE — Progress Notes (Signed)
Pt discharged with family at this time taking all personal belongings. Administered po Keflex per Md verbal order before she left. Tolerated well. Telemetry discontinued. IV discontinued, dry dressing applied. Discharge instructions provided to pt and daughter [facility](POA) with verbal understanding.

## 2015-01-07 NOTE — Progress Notes (Signed)
UR completed 

## 2015-01-08 NOTE — Progress Notes (Signed)
Received call from patient's granddaughter stating that her grandmother wants HHC services; choice offered, granddaughter chose [facility]; Miranda with [facility] called for arrangements; Alexis GoodellB Hady Niemczyk RN,BSN,MHA [phone]

## 2015-01-08 NOTE — Progress Notes (Signed)
[facility]; VM left for patient to return call for home health care services; awaiting call back; Alexis GoodellB Dalyah Pla RN,BSN,MHA [phone]

## 2015-01-12 NOTE — Progress Notes (Signed)
Physical Therapy Evaluation Addendum for G-codes    01/06/15 0938  PT G-Codes **NOT FOR INPATIENT CLASS**  Functional Assessment Tool Used clinical judgement  Functional Limitation Mobility: Walking and moving around  Mobility: Walking and Moving Around Current Status [phone](G8978) CJ  Mobility: Walking and Moving Around Goal Status [phone](G8979) CJ    01/12/2015 Veda CanningLynn Mikiah Cochran, PT Pager: [phone]

## 2015-01-18 ENCOUNTER — Other Ambulatory Visit: Payer: Self-pay | Admitting: Nurse Practitioner

## 2015-02-07 ENCOUNTER — Other Ambulatory Visit: Payer: Self-pay | Admitting: Family Medicine

## 2015-02-25 ENCOUNTER — Other Ambulatory Visit: Payer: Self-pay | Admitting: Family Medicine

## 2015-03-13 ENCOUNTER — Encounter (INDEPENDENT_AMBULATORY_CARE_PROVIDER_SITE_OTHER): Payer: Medicare Other | Admitting: Family Medicine

## 2015-03-13 DIAGNOSIS — I2510 Atherosclerotic heart disease of native coronary artery without angina pectoris: Secondary | ICD-10-CM

## 2015-03-13 DIAGNOSIS — N390 Urinary tract infection, site not specified: Secondary | ICD-10-CM | POA: Diagnosis not present

## 2015-03-13 DIAGNOSIS — I10 Essential (primary) hypertension: Secondary | ICD-10-CM | POA: Diagnosis not present

## 2015-03-13 DIAGNOSIS — E119 Type 2 diabetes mellitus without complications: Secondary | ICD-10-CM

## 2015-03-20 ENCOUNTER — Other Ambulatory Visit: Payer: Self-pay | Admitting: Family Medicine

## 2015-03-20 ENCOUNTER — Other Ambulatory Visit: Payer: Self-pay | Admitting: Nurse Practitioner

## 2015-03-23 ENCOUNTER — Other Ambulatory Visit: Payer: Self-pay | Admitting: *Deleted

## 2015-03-23 MED ORDER — GLUCOSE BLOOD VI STRP: ORAL_STRIP | Status: DC

## 2015-03-30 ENCOUNTER — Other Ambulatory Visit: Payer: Self-pay | Admitting: Nurse Practitioner

## 2015-04-18 ENCOUNTER — Other Ambulatory Visit: Payer: Self-pay | Admitting: Nurse Practitioner

## 2015-04-28 ENCOUNTER — Other Ambulatory Visit: Payer: Self-pay | Admitting: Family Medicine

## 2015-04-28 NOTE — Telephone Encounter (Signed)
Last seen 09/25/14 DWM   

## 2015-05-10 ENCOUNTER — Other Ambulatory Visit: Payer: Self-pay | Admitting: Family Medicine

## 2015-05-10 ENCOUNTER — Other Ambulatory Visit: Payer: Self-pay | Admitting: Nurse Practitioner

## 2015-05-11 NOTE — Telephone Encounter (Signed)
Last seen 11/13/165 DWM  Last glucose 01/07/15

## 2015-05-13 ENCOUNTER — Ambulatory Visit: Payer: Medicare Other | Admitting: Nurse Practitioner

## 2015-05-29 ENCOUNTER — Other Ambulatory Visit: Payer: Self-pay | Admitting: Family Medicine

## 2015-05-29 ENCOUNTER — Other Ambulatory Visit: Payer: Self-pay | Admitting: Nurse Practitioner

## 2015-06-01 ENCOUNTER — Other Ambulatory Visit: Payer: Self-pay | Admitting: Family Medicine

## 2015-06-01 NOTE — Telephone Encounter (Signed)
Last seen 09/25/14 DWM

## 2015-06-10 ENCOUNTER — Encounter: Payer: Self-pay | Admitting: Nurse Practitioner

## 2015-06-10 ENCOUNTER — Ambulatory Visit (INDEPENDENT_AMBULATORY_CARE_PROVIDER_SITE_OTHER): Payer: Medicare Other | Admitting: Nurse Practitioner

## 2015-06-10 ENCOUNTER — Other Ambulatory Visit: Payer: Self-pay | Admitting: Family Medicine

## 2015-06-10 VITALS — BP 127/73 | HR 73 | Temp 97.0°F | Ht <= 58 in | Wt 141.0 lb

## 2015-06-10 DIAGNOSIS — E039 Hypothyroidism, unspecified: Secondary | ICD-10-CM

## 2015-06-10 DIAGNOSIS — F329 Major depressive disorder, single episode, unspecified: Secondary | ICD-10-CM

## 2015-06-10 DIAGNOSIS — N393 Stress incontinence (female) (male): Secondary | ICD-10-CM

## 2015-06-10 DIAGNOSIS — E876 Hypokalemia: Secondary | ICD-10-CM

## 2015-06-10 DIAGNOSIS — I5022 Chronic systolic (congestive) heart failure: Secondary | ICD-10-CM

## 2015-06-10 DIAGNOSIS — E785 Hyperlipidemia, unspecified: Secondary | ICD-10-CM | POA: Diagnosis not present

## 2015-06-10 DIAGNOSIS — R0989 Other specified symptoms and signs involving the circulatory and respiratory systems: Secondary | ICD-10-CM

## 2015-06-10 DIAGNOSIS — R05 Cough: Secondary | ICD-10-CM

## 2015-06-10 DIAGNOSIS — K219 Gastro-esophageal reflux disease without esophagitis: Secondary | ICD-10-CM

## 2015-06-10 DIAGNOSIS — I10 Essential (primary) hypertension: Secondary | ICD-10-CM

## 2015-06-10 DIAGNOSIS — E109 Type 1 diabetes mellitus without complications: Secondary | ICD-10-CM

## 2015-06-10 DIAGNOSIS — R059 Cough, unspecified: Secondary | ICD-10-CM

## 2015-06-10 DIAGNOSIS — M810 Age-related osteoporosis without current pathological fracture: Secondary | ICD-10-CM | POA: Diagnosis not present

## 2015-06-10 DIAGNOSIS — F32A Depression, unspecified: Secondary | ICD-10-CM

## 2015-06-10 LAB — POCT GLYCOSYLATED HEMOGLOBIN (HGB A1C): HEMOGLOBIN A1C: 6.3

## 2015-06-10 MED ORDER — BUDESONIDE-FORMOTEROL FUMARATE 80-4.5 MCG/ACT IN AERO: 2.0000 | INHALATION_SPRAY | Freq: Two times a day (BID) | RESPIRATORY_TRACT | Status: DC

## 2015-06-10 MED ORDER — SOLIFENACIN SUCCINATE 10 MG PO TABS: 10.0000 mg | ORAL_TABLET | Freq: Every day | ORAL | Status: DC

## 2015-06-10 MED ORDER — CITALOPRAM HYDROBROMIDE 20 MG PO TABS: ORAL_TABLET | ORAL | Status: DC

## 2015-06-10 MED ORDER — CARVEDILOL 25 MG PO TABS: ORAL_TABLET | ORAL | Status: DC

## 2015-06-10 MED ORDER — LISINOPRIL 20 MG PO TABS: 20.0000 mg | ORAL_TABLET | Freq: Every day | ORAL | Status: DC

## 2015-06-10 MED ORDER — LEVOTHYROXINE SODIUM 75 MCG PO TABS: 75.0000 ug | ORAL_TABLET | Freq: Every day | ORAL | Status: DC

## 2015-06-10 MED ORDER — SAXAGLIPTIN-METFORMIN ER 2.5-1000 MG PO TB24: 1.0000 | ORAL_TABLET | Freq: Two times a day (BID) | ORAL | Status: DC

## 2015-06-10 MED ORDER — INSULIN GLARGINE 100 UNIT/ML ~~LOC~~ SOLN: SUBCUTANEOUS | Status: DC

## 2015-06-10 MED ORDER — POTASSIUM CHLORIDE ER 10 MEQ PO TBCR: 20.0000 meq | EXTENDED_RELEASE_TABLET | Freq: Every day | ORAL | Status: DC

## 2015-06-10 MED ORDER — OMEPRAZOLE 40 MG PO CPDR: 40.0000 mg | DELAYED_RELEASE_CAPSULE | Freq: Every day | ORAL | Status: DC

## 2015-06-10 MED ORDER — FUROSEMIDE 40 MG PO TABS: 40.0000 mg | ORAL_TABLET | Freq: Every day | ORAL | Status: DC

## 2015-06-10 MED ORDER — AMLODIPINE BESYLATE 5 MG PO TABS: ORAL_TABLET | ORAL | Status: DC

## 2015-06-10 NOTE — Addendum Note (Signed)
Addended by: Cleda Daub on: 06/10/2015 04:59 PM   Modules accepted: Orders

## 2015-06-10 NOTE — Progress Notes (Signed)
Subjective:    Patient ID: Jamie Cochran, female    DOB: [birth year], 80 y.o.   MRN: [id number]  Patient here today for follow up- no changes since last visit- memory is worsening- she lives with her granddaughter whom is her primary care giver- their only complaint to day is patients personal hygiene- she does not want to take baths and keep herself clean. We have had this discussion with heron many occasions.  Diabetes She presents for her follow-up diabetic visit. She has type 2 diabetes mellitus. Her disease course has been fluctuating. Pertinent negatives for hypoglycemia include no headaches. Pertinent negatives for diabetes include no chest pain, no fatigue, no polydipsia, no polyphagia, no polyuria and no visual change. Diabetic complications include a CVA. Pertinent negatives for diabetic complications include no retinopathy. When asked about meal planning, she reported none. She has not had a previous visit with a dietitian. She rarely participates in exercise. Her home blood glucose trend is fluctuating minimally. Her breakfast blood glucose is taken between 8-9 am. Her breakfast blood glucose range is generally 140-180 mg/dl. Her highest blood glucose is >200 mg/dl. She sees a podiatrist.Eye exam is not current.  Hypertension This is a chronic problem. The current episode started more than 1 year ago. The problem has been waxing and waning since onset. Pertinent negatives include no chest pain, headaches, neck pain, palpitations or shortness of breath. Risk factors for coronary artery disease include diabetes mellitus, dyslipidemia, family history, obesity, sedentary lifestyle and post-menopausal state. Past treatments include ACE inhibitors, calcium channel blockers and beta blockers. The current treatment provides moderate improvement. Compliance problems include diet and exercise.  Hypertensive end-organ damage includes CAD/MI, CVA and a thyroid problem. There is no history of  retinopathy.  Congestive Heart Failure Presents for follow-up visit. Pertinent negatives include no chest pain, fatigue, palpitations or shortness of breath. The symptoms have been stable. Past treatments include ACE inhibitors (she also takes lasix daily and zaroxlyn as needed when swelling worsens and she has SOB.). Her past medical history is significant for CAD, CVA, DM and HTN.  Hyperlipidemia This is a chronic problem. The current episode started more than 1 year ago. The problem is uncontrolled. Recent lipid tests were reviewed and are variable. Pertinent negatives include no chest pain or shortness of breath. She is currently on no antihyperlipidemic treatment.  Thyroid Problem Presents for follow-up visit. Patient reports no constipation, fatigue, hair loss, heat intolerance, palpitations or visual change. The symptoms have been stable. Her past medical history is significant for hyperlipidemia.  Gastrophageal Reflux She reports no chest pain, no coughing or no nausea. Pertinent negatives include no fatigue. She has tried a PPI for the symptoms. The treatment provided moderate relief. Past procedures include an EGD.  Depression Pt takes Celexa 20 mg-Pt tolerate well with no complaints Insomnia Pt taking Remeron 15 mg- Pt states it helps her sleep hypokalemia K+ supplements daily- no c/o lower ext cramping Stress urinary incontinence Is currently on myrbetriq which helps some but has occasssional accidents COPD Hx of smoking- on symbicort daily- helps with her wheezing and chronic cough- does not have need for rescue inhaler.   *Review of Systems  Constitutional: Negative for fatigue.  Respiratory: Negative for cough and shortness of breath.   Cardiovascular: Negative for chest pain and palpitations.  Gastrointestinal: Negative for nausea and constipation.  Endocrine: Negative for heat intolerance, polydipsia, polyphagia and polyuria.  Musculoskeletal: Negative for neck pain.   Neurological: Negative for headaches.  All other systems  reviewed and are negative.      Objective:   Physical Exam  Constitutional: She is oriented to person, place, and time. She appears well-developed and well-nourished.  HENT:  Head: Normocephalic.  Eyes: EOM are normal. Pupils are equal, round, and reactive to light.  Neck: Normal range of motion. Neck supple. No thyromegaly present.  Cardiovascular: Normal rate, regular rhythm, normal heart sounds and intact distal pulses.   No murmur heard. Pulmonary/Chest: Effort normal and breath sounds normal. No respiratory distress. She has no wheezes. She has no rales. She exhibits no tenderness.  Abdominal: Soft. Bowel sounds are normal. She exhibits no distension. There is no tenderness.  Musculoskeletal: Normal range of motion. She exhibits edema (2+ edema bil lower ext). She exhibits no tenderness.  Neurological: She is alert and oriented to person, place, and time.  Skin: Skin is warm and dry.  Psychiatric: She has a normal mood and affect. Her behavior is normal. Judgment and thought content normal.  Patient seems less talkative then usual answers most questions appropriately.  Vitals reviewed.    BP 127/73 mmHg  Pulse 73  Temp(Src) 97 F (36.1 C) (Oral)  Ht '4\' 8"'  (1.422 m)  Wt 141 lb (63.957 kg)  BMI 31.63 kg/m2  LMP 02/04/1963   Results for orders placed or performed in visit on 06/10/15  POCT glycosylated hemoglobin (Hb A1C)  Result Value Ref Range   Hemoglobin A1C 6.3        Assessment & Plan:   1. Type 1 diabetes mellitus without complication Continue to watch carbs - POCT glycosylated hemoglobin (Hb A1C) - Saxagliptin-Metformin (KOMBIGLYZE XR) 2.03-999 MG TB24; Take 1 tablet by mouth 2 (two) times daily.  Dispense: 60 tablet; Refill: 5 - insulin glargine (LANTUS) 100 UNIT/ML injection; INJECT 24 UNITS SUBCUTANEOUSLY AT BEDTIME  Dispense: 10 vial; Refill: 5  2. Hyperlipidemia Low fat diet - Lipid  panel  3. Essential hypertension Do not add salt to diet - CMP14+EGFR - lisinopril (PRINIVIL,ZESTRIL) 20 MG tablet; Take 1 tablet (20 mg total) by mouth daily.  Dispense: 30 tablet; Refill: 5 - carvedilol (COREG) 25 MG tablet; TAKE ONE TABLET BY MOUTH TWICE DAILY WITH MEALS MUST  BE  SEEN  FOR  ADDITIONAL  REFILLS  Dispense: 60 tablet; Refill: 5 - amLODipine (NORVASC) 5 MG tablet; TAKE ONE TABLET BY MOUTH ONCE DAILY MUST  BE  SEEN  FOR  ADDITIONAL  REFILLS  Dispense: 30 tablet; Refill: 5  4. Chronic systolic congestive heart failure - furosemide (LASIX) 40 MG tablet; Take 1 tablet (40 mg total) by mouth daily.  Dispense: 30 tablet; Refill: 5  5. Hypothyroidism, unspecified hypothyroidism type - levothyroxine (SYNTHROID, LEVOTHROID) 75 MCG tablet; Take 1 tablet (75 mcg total) by mouth daily.  Dispense: 30 tablet; Refill: 5  6. Osteoporosis Weight bearing exercises  7. Stress incontinence, female Changed myrbetriq to vesicare  8. Cough - budesonide-formoterol (SYMBICORT) 80-4.5 MCG/ACT inhaler; Inhale 2 puffs into the lungs 2 (two) times daily.  Dispense: 1 Inhaler; Refill: 3  9. Chest congestion  10. Hypokalemia - potassium chloride (K-DUR) 10 MEQ tablet; Take 2 tablets (20 mEq total) by mouth daily.  Dispense: 60 tablet; Refill: 5  11. Gastroesophageal reflux disease without esophagitis Avoid spicy foods Do not eat 2 hours prior to bedtime  - omeprazole (PRILOSEC) 40 MG capsule; Take 1 capsule (40 mg total) by mouth daily.  Dispense: 30 capsule; Refill: 5  12. Depression Stress management - citalopram (CELEXA) 20 MG tablet; TAKE ONE TABLET BY  MOUTH ONCE DAILY MUST  BE  SEEN  FOR  ADDITIONAL  REFILLS  Dispense: 30 tablet; Refill: 5  13. Stress incontinence  solifenacin (VESICARE) 10 MG tablet; Take 1 tablet (10 mg total) by mouth daily.  Dispense: 30 tablet; Refill: 5    Labs pending Health maintenance reviewed Diet and exercise encouraged Continue all meds Follow up   In 3 months   [place], FNP

## 2015-06-10 NOTE — Patient Instructions (Signed)
Urinary Incontinence Urinary incontinence is the involuntary loss of urine from your bladder. CAUSES  There are many causes of urinary incontinence. They include:  Medicines.  Infections.  Prostatic enlargement, leading to overflow of urine from your bladder.  Surgery.  Neurological diseases.  Emotional factors. SIGNS AND SYMPTOMS Urinary Incontinence can be divided into four types: 1. Urge incontinence. Urge incontinence is the involuntary loss of urine before you have the opportunity to go to the bathroom. There is a sudden urge to void but not enough time to reach a bathroom. 2. Stress incontinence. Stress incontinence is the sudden loss of urine with any activity that forces urine to pass. It is commonly caused by anatomical changes to the pelvis and sphincter areas of your body. 3. Overflow incontinence. Overflow incontinence is the loss of urine from an obstructed opening to your bladder. This results in a backup of urine and a resultant buildup of pressure within the bladder. When the pressure within the bladder exceeds the closing pressure of the sphincter, the urine overflows, which causes incontinence, similar to water overflowing a dam. 4. Total incontinence. Total incontinence is the loss of urine as a result of the inability to store urine within your bladder. DIAGNOSIS  Evaluating the cause of incontinence may require:  A thorough and complete medical and obstetric history.  A complete physical exam.  Laboratory tests such as a urine culture and sensitivities. When additional tests are indicated, they can include:  An ultrasound exam.  Kidney and bladder X-rays.  Cystoscopy. This is an exam of the bladder using a narrow scope.  Urodynamic testing to test the nerve function to the bladder and sphincter areas. TREATMENT  Treatment for urinary incontinence depends on the cause:  For urge incontinence caused by a bacterial infection, antibiotics will be prescribed.  If the urge incontinence is related to medicines you take, your health care provider may have you change the medicine.  For stress incontinence, surgery to re-establish anatomical support to the bladder or sphincter, or both, will often correct the condition.  For overflow incontinence caused by an enlarged prostate, an operation to open the channel through the enlarged prostate will allow the flow of urine out of the bladder. In women with fibroids, a hysterectomy may be recommended.  For total incontinence, surgery on your urinary sphincter may help. An artificial urinary sphincter (an inflatable cuff placed around the urethra) may be required. In women who have developed a hole-like passage between their bladder and vagina (vesicovaginal fistula), surgery to close the fistula often is required. HOME CARE INSTRUCTIONS  Normal daily hygiene and the use of pads or adult diapers that are changed regularly will help prevent odors and skin damage.  Avoid caffeine. It can overstimulate your bladder.  Use the bathroom regularly. Try about every 2-3 hours to go to the bathroom, even if you do not feel the need to do so. Take time to empty your bladder completely. After urinating, wait a minute. Then try to urinate again.  For causes involving nerve dysfunction, keep a log of the medicines you take and a journal of the times you go to the bathroom. SEEK MEDICAL CARE IF:  You experience worsening of pain instead of improvement in pain after your procedure.  Your incontinence becomes worse instead of better. SEE IMMEDIATE MEDICAL CARE IF:  You experience fever or shaking chills.  You are unable to pass your urine.  You have redness spreading into your groin or down into your thighs. MAKE SURE   YOU:   Understand these instructions.   Will watch your condition.  Will get help right away if you are not doing well or get worse. Document Released: 12/07/2004 Document Revised: 08/20/2013 Document  Reviewed: 04/08/2013 [employer] Patient Information 2015 [employer]. This information is not intended to replace advice given to you by your health care provider. Make sure you discuss any questions you have with your health care provider.  

## 2015-06-11 LAB — CMP14+EGFR
ALK PHOS: 60 IU/L (ref 39–117)
ALT: 9 IU/L (ref 0–32)
AST: 13 IU/L (ref 0–40)
Albumin/Globulin Ratio: 1.6 (ref 1.1–2.5)
Albumin: 4 g/dL (ref 3.5–4.7)
BILIRUBIN TOTAL: 0.3 mg/dL (ref 0.0–1.2)
BUN/Creatinine Ratio: 22 (ref 11–26)
BUN: 18 mg/dL (ref 8–27)
CO2: 28 mmol/L (ref 18–29)
CREATININE: 0.82 mg/dL (ref 0.57–1.00)
Calcium: 9.3 mg/dL (ref 8.7–10.3)
Chloride: 95 mmol/L — ABNORMAL LOW (ref 97–108)
GFR calc Af Amer: 77 mL/min/{1.73_m2} (ref >59)
GFR calc non Af Amer: 67 mL/min/{1.73_m2} (ref >59)
Globulin, Total: 2.5 g/dL (ref 1.5–4.5)
Glucose: 74 mg/dL (ref 65–99)
Potassium: 4.3 mmol/L (ref 3.5–5.2)
SODIUM: 139 mmol/L (ref 134–144)
Total Protein: 6.5 g/dL (ref 6.0–8.5)

## 2015-06-11 LAB — LIPID PANEL
CHOL/HDL RATIO: 6 ratio — AB (ref 0.0–4.4)
Cholesterol, Total: 191 mg/dL (ref 100–199)
HDL: 32 mg/dL — ABNORMAL LOW (ref >39)
LDL Calculated: 117 mg/dL — ABNORMAL HIGH (ref 0–99)
TRIGLYCERIDES: 209 mg/dL — AB (ref 0–149)
VLDL Cholesterol Cal: 42 mg/dL — ABNORMAL HIGH (ref 5–40)

## 2015-08-21 ENCOUNTER — Other Ambulatory Visit: Payer: Self-pay | Admitting: Nurse Practitioner

## 2015-09-07 ENCOUNTER — Ambulatory Visit (INDEPENDENT_AMBULATORY_CARE_PROVIDER_SITE_OTHER): Payer: Medicare Other | Admitting: Nurse Practitioner

## 2015-09-07 ENCOUNTER — Encounter: Payer: Self-pay | Admitting: Nurse Practitioner

## 2015-09-07 VITALS — BP 130/68 | HR 60 | Temp 96.8°F | Ht <= 58 in | Wt 136.0 lb

## 2015-09-07 DIAGNOSIS — E785 Hyperlipidemia, unspecified: Secondary | ICD-10-CM

## 2015-09-07 DIAGNOSIS — E109 Type 1 diabetes mellitus without complications: Secondary | ICD-10-CM | POA: Diagnosis not present

## 2015-09-07 DIAGNOSIS — F0280 Dementia in other diseases classified elsewhere without behavioral disturbance: Secondary | ICD-10-CM | POA: Diagnosis not present

## 2015-09-07 DIAGNOSIS — G3183 Dementia with Lewy bodies: Secondary | ICD-10-CM

## 2015-09-07 DIAGNOSIS — I10 Essential (primary) hypertension: Secondary | ICD-10-CM

## 2015-09-07 DIAGNOSIS — M810 Age-related osteoporosis without current pathological fracture: Secondary | ICD-10-CM

## 2015-09-07 DIAGNOSIS — Z23 Encounter for immunization: Secondary | ICD-10-CM | POA: Diagnosis not present

## 2015-09-07 DIAGNOSIS — I5022 Chronic systolic (congestive) heart failure: Secondary | ICD-10-CM | POA: Diagnosis not present

## 2015-09-07 DIAGNOSIS — E039 Hypothyroidism, unspecified: Secondary | ICD-10-CM | POA: Diagnosis not present

## 2015-09-07 DIAGNOSIS — N393 Stress incontinence (female) (male): Secondary | ICD-10-CM | POA: Diagnosis not present

## 2015-09-07 MED ORDER — DONEPEZIL HCL 10 MG PO TABS: 10.0000 mg | ORAL_TABLET | Freq: Every day | ORAL | Status: DC

## 2015-09-07 NOTE — Progress Notes (Signed)
Subjective:    Patient ID: KAITLAN BIN, female    DOB: 1930/02/27, 81 y.o.   MRN: [id number]  Patient here today for follow up- No complaints today  Diabetes She presents for her follow-up diabetic visit. She has type 2 diabetes mellitus. Her disease course has been fluctuating. Pertinent negatives for hypoglycemia include no headaches. Pertinent negatives for diabetes include no chest pain, no fatigue, no polydipsia, no polyphagia, no polyuria and no visual change. Diabetic complications include a CVA. Pertinent negatives for diabetic complications include no retinopathy. Current diabetic treatment includes insulin injections and oral agent (dual therapy) (has not needed sliding scale but maybe 1x a week.). When asked about meal planning, she reported none. She has not had a previous visit with a dietitian. She rarely participates in exercise. Her home blood glucose trend is fluctuating minimally. Her breakfast blood glucose is taken between 8-9 am. Her breakfast blood glucose range is generally 140-180 mg/dl. Her highest blood glucose is >200 mg/dl. She sees a podiatrist.Eye exam is not current.  Hypertension This is a chronic problem. The current episode started more than 1 year ago. The problem has been waxing and waning since onset. Pertinent negatives include no chest pain, headaches, neck pain, palpitations or shortness of breath. Risk factors for coronary artery disease include diabetes mellitus, dyslipidemia, family history, obesity, sedentary lifestyle and post-menopausal state. Past treatments include ACE inhibitors, calcium channel blockers and beta blockers. The current treatment provides moderate improvement. Compliance problems include diet and exercise.  Hypertensive end-organ damage includes CAD/MI, CVA and a thyroid problem. There is no history of retinopathy.  Congestive Heart Failure Presents for follow-up visit. Pertinent negatives include no chest pain, fatigue,  palpitations or shortness of breath. The symptoms have been stable. Past treatments include ACE inhibitors (she also takes lasix daily and zaroxlyn as needed when swelling worsens and she has SOB.). Her past medical history is significant for CAD, CVA, DM and HTN.  Hyperlipidemia This is a chronic problem. The current episode started more than 1 year ago. The problem is uncontrolled. Recent lipid tests were reviewed and are variable. Pertinent negatives include no chest pain or shortness of breath. She is currently on no antihyperlipidemic treatment.  Thyroid Problem Presents for follow-up visit. Patient reports no constipation, fatigue, hair loss, heat intolerance, palpitations or visual change. The symptoms have been stable. Her past medical history is significant for hyperlipidemia.  Gastroesophageal Reflux She reports no chest pain, no coughing or no nausea. Pertinent negatives include no fatigue. She has tried a PPI for the symptoms. The treatment provided moderate relief. Past procedures include an EGD.  Depression Pt takes Celexa 20 mg-Pt tolerate well with no complaints Insomnia Pt taking Remeron 15 mg- Pt states it helps her sleep hypokalemia K+ supplements daily- no c/o lower ext cramping Stress urinary incontinence Is currently on myrbetriq which helps some but has occasssional accidents COPD Hx of smoking- on symbicort daily- helps with her wheezing and chronic cough- does not have need for rescue inhaler.  * starting to have some trouble recognizing people tat she sees on a regular basis  *Review of Systems  Constitutional: Negative for fatigue.  Respiratory: Negative for cough and shortness of breath.   Cardiovascular: Negative for chest pain and palpitations.  Gastrointestinal: Negative for nausea and constipation.  Endocrine: Negative for heat intolerance, polydipsia, polyphagia and polyuria.  Musculoskeletal: Negative for neck pain.  Neurological: Negative for headaches.   All other systems reviewed and are negative.  Objective:   Physical Exam  Constitutional: She is oriented to person, place, and time. She appears well-developed and well-nourished.  HENT:  Head: Normocephalic.  Eyes: EOM are normal. Pupils are equal, round, and reactive to light.  Neck: Normal range of motion. Neck supple. No thyromegaly present.  Cardiovascular: Normal rate, regular rhythm, normal heart sounds and intact distal pulses.   No murmur heard. Pulmonary/Chest: Effort normal and breath sounds normal. No respiratory distress. She has no wheezes. She has no rales. She exhibits no tenderness.  Abdominal: Soft. Bowel sounds are normal. She exhibits no distension. There is no tenderness.  Musculoskeletal: Normal range of motion. She exhibits no tenderness. Edema: 2+ edema bil lower ext.  Neurological: She is alert and oriented to person, place, and time.  Skin: Skin is warm and dry.  Psychiatric: She has a normal mood and affect. Her behavior is normal. Judgment and thought content normal.  Vitals reviewed.    BP 130/68 mmHg  Pulse 60  Temp(Src) 96.8 F (36 C) (Oral)  Ht _0  (1.422 m)  Wt 136 lb (61.689 kg)  BMI 30.51 kg/m2  LMP 02/04/1963   Assessment & Plan:   1. Essential hypertension No added salt to diet Dash Diet  Exercise as tolerated - CMP14+EGFR  2. Chronic systolic congestive heart failure ([place]) Continue with current medication regimen No edema noted at this visit  3. Hypothyroidism, unspecified hypothyroidism type Continue with current medication  4. Type 1 diabetes mellitus without complication ([facility]) Use insulin as directed and continue to check CBGs 3 times a day  5. Osteoporosis Weight bearing exercises  6. Hyperlipidemia Low fat diet and exercise - Lipid panel  7. Stress incontinence, female Bathroom regimen Continue with vesicare  8. Dementia - aricept 36m 1 po Qd #30 3 refills    Flu shot given Continue all meds Labs  pending Health Maintenance reviewed Diet and exercise encouraged RTO 3 months  Mary-Margaret MHassell Done FNP

## 2015-09-07 NOTE — Patient Instructions (Signed)

## 2015-09-08 LAB — CMP14+EGFR
A/G RATIO: 1.6 (ref 1.1–2.5)
ALBUMIN: 4 g/dL (ref 3.5–4.7)
ALK PHOS: 55 IU/L (ref 39–117)
ALT: 9 IU/L (ref 0–32)
AST: 15 IU/L (ref 0–40)
BUN/Creatinine Ratio: 22 (ref 11–26)
BUN: 19 mg/dL (ref 8–27)
Bilirubin Total: 0.3 mg/dL (ref 0.0–1.2)
CO2: 29 mmol/L (ref 18–29)
CREATININE: 0.85 mg/dL (ref 0.57–1.00)
Calcium: 9.5 mg/dL (ref 8.7–10.3)
Chloride: 100 mmol/L (ref 97–106)
GFR calc Af Amer: 73 mL/min/{1.73_m2} (ref >59)
GFR calc non Af Amer: 64 mL/min/{1.73_m2} (ref >59)
GLOBULIN, TOTAL: 2.5 g/dL (ref 1.5–4.5)
Glucose: 109 mg/dL — ABNORMAL HIGH (ref 65–99)
Potassium: 4.2 mmol/L (ref 3.5–5.2)
Sodium: 145 mmol/L — ABNORMAL HIGH (ref 136–144)
Total Protein: 6.5 g/dL (ref 6.0–8.5)

## 2015-09-08 LAB — LIPID PANEL
CHOLESTEROL TOTAL: 173 mg/dL (ref 100–199)
Chol/HDL Ratio: 6.2 ratio units — ABNORMAL HIGH (ref 0.0–4.4)
HDL: 28 mg/dL — ABNORMAL LOW (ref >39)
LDL CALC: 90 mg/dL (ref 0–99)
TRIGLYCERIDES: 273 mg/dL — AB (ref 0–149)
VLDL CHOLESTEROL CAL: 55 mg/dL — AB (ref 5–40)

## 2015-09-09 ENCOUNTER — Other Ambulatory Visit: Payer: Self-pay | Admitting: Nurse Practitioner

## 2015-09-09 MED ORDER — FENOFIBRATE 145 MG PO TABS: 145.0000 mg | ORAL_TABLET | Freq: Every day | ORAL | Status: DC

## 2015-09-13 ENCOUNTER — Ambulatory Visit: Payer: Medicare Other | Admitting: Nurse Practitioner

## 2015-09-14 DIAGNOSIS — N390 Urinary tract infection, site not specified: Secondary | ICD-10-CM

## 2015-09-14 HISTORY — DX: Urinary tract infection, site not specified: N39.0

## 2015-09-17 ENCOUNTER — Encounter (HOSPITAL_COMMUNITY): Payer: Self-pay

## 2015-09-17 ENCOUNTER — Emergency Department (HOSPITAL_COMMUNITY): Payer: Medicare Other

## 2015-09-17 ENCOUNTER — Inpatient Hospital Stay (HOSPITAL_COMMUNITY)
Admission: EM | Admit: 2015-09-17 | Discharge: 2015-09-21 | DRG: 071 | Disposition: A | Discharge status: Home / Self Care | Payer: Medicare Other | Attending: Internal Medicine | Admitting: Internal Medicine

## 2015-09-17 DIAGNOSIS — I5022 Chronic systolic (congestive) heart failure: Secondary | ICD-10-CM

## 2015-09-17 DIAGNOSIS — I5032 Chronic diastolic (congestive) heart failure: Secondary | ICD-10-CM | POA: Diagnosis present

## 2015-09-17 DIAGNOSIS — R404 Transient alteration of awareness: Secondary | ICD-10-CM

## 2015-09-17 DIAGNOSIS — G9340 Encephalopathy, unspecified: Secondary | ICD-10-CM | POA: Diagnosis present

## 2015-09-17 DIAGNOSIS — M810 Age-related osteoporosis without current pathological fracture: Secondary | ICD-10-CM | POA: Diagnosis present

## 2015-09-17 DIAGNOSIS — E11649 Type 2 diabetes mellitus with hypoglycemia without coma: Secondary | ICD-10-CM | POA: Diagnosis present

## 2015-09-17 DIAGNOSIS — E039 Hypothyroidism, unspecified: Secondary | ICD-10-CM | POA: Diagnosis present

## 2015-09-17 DIAGNOSIS — I110 Hypertensive heart disease with heart failure: Secondary | ICD-10-CM | POA: Diagnosis present

## 2015-09-17 DIAGNOSIS — H9190 Unspecified hearing loss, unspecified ear: Secondary | ICD-10-CM | POA: Diagnosis present

## 2015-09-17 DIAGNOSIS — I5030 Unspecified diastolic (congestive) heart failure: Secondary | ICD-10-CM | POA: Diagnosis present

## 2015-09-17 DIAGNOSIS — R05 Cough: Secondary | ICD-10-CM

## 2015-09-17 DIAGNOSIS — R059 Cough, unspecified: Secondary | ICD-10-CM | POA: Insufficient documentation

## 2015-09-17 DIAGNOSIS — N390 Urinary tract infection, site not specified: Secondary | ICD-10-CM | POA: Diagnosis present

## 2015-09-17 DIAGNOSIS — Z8673 Personal history of transient ischemic attack (TIA), and cerebral infarction without residual deficits: Secondary | ICD-10-CM

## 2015-09-17 DIAGNOSIS — E785 Hyperlipidemia, unspecified: Secondary | ICD-10-CM | POA: Diagnosis present

## 2015-09-17 DIAGNOSIS — F0390 Unspecified dementia without behavioral disturbance: Secondary | ICD-10-CM | POA: Diagnosis present

## 2015-09-17 DIAGNOSIS — I10 Essential (primary) hypertension: Secondary | ICD-10-CM | POA: Diagnosis present

## 2015-09-17 DIAGNOSIS — B9620 Unspecified Escherichia coli [E. coli] as the cause of diseases classified elsewhere: Secondary | ICD-10-CM | POA: Diagnosis present

## 2015-09-17 DIAGNOSIS — R509 Fever, unspecified: Secondary | ICD-10-CM

## 2015-09-17 DIAGNOSIS — I2510 Atherosclerotic heart disease of native coronary artery without angina pectoris: Secondary | ICD-10-CM | POA: Diagnosis present

## 2015-09-17 DIAGNOSIS — R4781 Slurred speech: Secondary | ICD-10-CM | POA: Diagnosis present

## 2015-09-17 HISTORY — DX: Urinary tract infection, site not specified: N39.0

## 2015-09-17 LAB — COMPREHENSIVE METABOLIC PANEL
ALT: 16 U/L (ref 14–54)
AST: 31 U/L (ref 15–41)
Albumin: 3.5 g/dL (ref 3.5–5.0)
Alkaline Phosphatase: 42 U/L (ref 38–126)
Anion gap: 12 (ref 5–15)
BUN: 20 mg/dL (ref 6–20)
CHLORIDE: 97 mmol/L — AB (ref 101–111)
CO2: 33 mmol/L — AB (ref 22–32)
CREATININE: 1.23 mg/dL — AB (ref 0.44–1.00)
Calcium: 9.9 mg/dL (ref 8.9–10.3)
GFR calc non Af Amer: 39 mL/min — ABNORMAL LOW (ref >60)
GFR, EST AFRICAN AMERICAN: 46 mL/min — AB (ref >60)
Glucose, Bld: 129 mg/dL — ABNORMAL HIGH (ref 65–99)
POTASSIUM: 3.7 mmol/L (ref 3.5–5.1)
SODIUM: 142 mmol/L (ref 135–145)
Total Bilirubin: 0.7 mg/dL (ref 0.3–1.2)
Total Protein: 6.8 g/dL (ref 6.5–8.1)

## 2015-09-17 LAB — CBC WITH DIFFERENTIAL/PLATELET
BASOS ABS: 0 10*3/uL (ref 0.0–0.1)
Basophils Relative: 0 %
EOS ABS: 0.1 10*3/uL (ref 0.0–0.7)
EOS PCT: 1 %
HCT: 37 % (ref 36.0–46.0)
Hemoglobin: 12.2 g/dL (ref 12.0–15.0)
Lymphocytes Relative: 31 %
Lymphs Abs: 2.8 10*3/uL (ref 0.7–4.0)
MCH: 29.5 pg (ref 26.0–34.0)
MCHC: 33 g/dL (ref 30.0–36.0)
MCV: 89.6 fL (ref 78.0–100.0)
MONO ABS: 1.2 10*3/uL — AB (ref 0.1–1.0)
Monocytes Relative: 13 %
Neutro Abs: 5 10*3/uL (ref 1.7–7.7)
Neutrophils Relative %: 55 %
PLATELETS: 146 10*3/uL — AB (ref 150–400)
RBC: 4.13 MIL/uL (ref 3.87–5.11)
RDW: 14.6 % (ref 11.5–15.5)
WBC: 9.1 10*3/uL (ref 4.0–10.5)

## 2015-09-17 LAB — URINE MICROSCOPIC-ADD ON

## 2015-09-17 LAB — URINALYSIS, ROUTINE W REFLEX MICROSCOPIC
Bilirubin Urine: NEGATIVE
Glucose, UA: NEGATIVE mg/dL
Ketones, ur: NEGATIVE mg/dL
Nitrite: POSITIVE — AB
PROTEIN: NEGATIVE mg/dL
SPECIFIC GRAVITY, URINE: 1.019 (ref 1.005–1.030)
UROBILINOGEN UA: 1 mg/dL (ref 0.0–1.0)
pH: 5.5 (ref 5.0–8.0)

## 2015-09-17 MED ORDER — DEXTROSE 5 % IV SOLN
1.0000 g | Freq: Once | INTRAVENOUS | Status: AC
  Administered 2015-09-17: 1 g via INTRAVENOUS
  Filled 2015-09-17: qty 10

## 2015-09-17 MED ORDER — ACETAMINOPHEN 325 MG PO TABS
650.0000 mg | ORAL_TABLET | Freq: Once | ORAL | Status: AC
Start: 2015-09-18 — End: 2015-09-18
  Administered 2015-09-18: 650 mg via ORAL
  Filled 2015-09-17: qty 2

## 2015-09-17 NOTE — ED Notes (Signed)
Patient transported to CT 

## 2015-09-17 NOTE — ED Notes (Signed)
Pt here for dry, non productive cough that started today, tiredness and "runny nose." Pts granddaughter reports it took her 2 1/2 hours to get her out of bed this morning.

## 2015-09-17 NOTE — ED Provider Notes (Signed)
CSN: [id number]     Arrival date & time 09/17/15  1958 History   First MD Initiated Contact with Patient 09/17/15 2012     Chief Complaint  Patient presents with  . Cough   Level V caveat: Altered mental status  (Consider location/radiation/quality/duration/timing/severity/associated sxs/prior Treatment) HPI Jamie Cochran Mode is a 81 y.o. female patient with a history of dementia, insulin-dependent type 2 diabetes, hypertension, CVA, CHF, comes in for evaluation of cough.Granddaughter is at bedside and both are overall poor historians. Granddaughter lives with patient. Reports patient began coughing yesterday, seemed incredibly tired and "talking out of her head". Also reports yesterday patient was complaining of headaches, went to bed at 9:00pm and that was last time seen normal. Granddaughter reports associated runny nose. Patient reports left-sided flank pain, worse with movement. Cough is nonproductive, no hemoptysis. No fevers, chills, chest pain or shortness of breath, abdominal pain, nausea or vomiting, diaphoresis, leg swelling.  Past Medical History  Diagnosis Date  . Diabetes type 2, uncontrolled (HCC)   . Non-occlusive coronary artery disease     40% first diagonal stenosis, 50% obtuse marginal stenosis  . Hypertension   . Stroke (HCC) in 05/2012    deep right frontal perventricular deep white matter infarct + subacute left thalamic lacunar infarct  . Hyperlipidemia   . Hypothyroidism   . Osteoporosis   . Hearing loss     history of right sided cholesteotoma s/p Right tympanoplasty with ossicular reconstruction. (03/2002)  . Urinary incontinence     s/p surgeyr for ureterocele  . CHF (congestive heart failure) [facility](HCC)    Past Surgical History  Procedure Laterality Date  . Abdominal hysterectomy    . Cesarean section    . Bladder repair    . Inner ear surgery    . Tonsillectomy and adenoidectomy     Family History  Problem Relation Age of Onset  . Heart disease  Father    Social History  Substance Use Topics  . Smoking status: Never Smoker   . Smokeless tobacco: None  . Alcohol Use: No   OB History    No data available     Review of Systems  Unable to perform ROS: Mental status change      Allergies  Sulfa antibiotics  Home Medications   Prior to Admission medications   Medication Sig Start Date End Date Taking? Authorizing Provider  amLODipine (NORVASC) 5 MG tablet TAKE ONE TABLET BY MOUTH ONCE DAILY MUST  BE  SEEN  FOR  ADDITIONAL  REFILLS 06/10/15   Mary-Margaret Daphine DeutscherMartin, FNP  aspirin 81 MG tablet Take 81 mg by mouth daily.    Historical Provider, MD  carvedilol (COREG) 25 MG tablet TAKE ONE TABLET BY MOUTH TWICE DAILY WITH MEALS MUST  BE  SEEN  FOR  ADDITIONAL  REFILLS 06/10/15   Mary-Margaret Daphine DeutscherMartin, FNP  Cinnamon 500 MG capsule Take 500 mg by mouth 2 (two) times daily.    Historical Provider, MD  citalopram (CELEXA) 20 MG tablet TAKE ONE TABLET BY MOUTH ONCE DAILY MUST  BE  SEEN  FOR  ADDITIONAL  REFILLS 06/10/15   Mary-Margaret Daphine DeutscherMartin, FNP  donepezil (ARICEPT) 10 MG tablet Take 1 tablet (10 mg total) by mouth at bedtime. 09/07/15   Mary-Margaret Daphine DeutscherMartin, FNP  fenofibrate (TRICOR) 145 MG tablet Take 1 tablet (145 mg total) by mouth daily. 09/09/15   Mary-Margaret Daphine DeutscherMartin, FNP  furosemide (LASIX) 40 MG tablet Take 1 tablet (40 mg total) by mouth daily. 06/10/15   Mary-Margaret  Daphine Deutscher, FNP  glucose blood (ONE TOUCH ULTRA TEST) test strip 1 each by Other route 4 (four) times daily - after meals and at bedtime. 12/01/13   Mary-Margaret Daphine Deutscher, FNP  glucose blood (ONE TOUCH ULTRA TEST) test strip Test tid. DX E10.9 08/23/15   Mary-Margaret Daphine Deutscher, FNP  insulin aspart (NOVOLOG) 100 UNIT/ML injection Inject 0-15 Units into the skin 3 (three) times daily with meals. 06/03/12 06/03/13  Emory Nani Skillern, MD  insulin glargine (LANTUS) 100 UNIT/ML injection INJECT 24 UNITS SUBCUTANEOUSLY AT BEDTIME 06/10/15   Mary-Margaret Daphine Deutscher, FNP  levothyroxine  (SYNTHROID, LEVOTHROID) 75 MCG tablet Take 1 tablet (75 mcg total) by mouth daily. 06/10/15   Mary-Margaret Daphine Deutscher, FNP  lisinopril (PRINIVIL,ZESTRIL) 20 MG tablet Take 1 tablet (20 mg total) by mouth daily. 06/10/15   Mary-Margaret Daphine Deutscher, FNP  metolazone (ZAROXOLYN) 2.5 MG tablet Take on po q am for 2 days for severe swelling Patient taking differently: Take 2.5 mg by mouth daily as needed (swelling).  05/07/14   Deatra Canter, FNP  omeprazole (PRILOSEC) 40 MG capsule Take 1 capsule (40 mg total) by mouth daily. 06/10/15   Mary-Margaret Daphine Deutscher, FNP  omeprazole (PRILOSEC) 40 MG capsule TAKE ONE CAPSULE BY MOUTH ONCE DAILY 06/11/15   Mary-Margaret Daphine Deutscher, FNP  potassium chloride (K-DUR) 10 MEQ tablet Take 2 tablets (20 mEq total) by mouth daily. 06/10/15   Mary-Margaret Daphine Deutscher, FNP  Saxagliptin-Metformin (KOMBIGLYZE XR) 2.03-999 MG TB24 Take 1 tablet by mouth 2 (two) times daily. 06/10/15   Mary-Margaret Daphine Deutscher, FNP  solifenacin (VESICARE) 10 MG tablet Take 1 tablet (10 mg total) by mouth daily. 06/10/15   Mary-Margaret Daphine Deutscher, FNP   BP 134/52 mmHg  Pulse 70  Temp(Src) 101.1 F (38.4 C) (Rectal)  Resp 18  SpO2 95%  LMP 02/04/1963 Physical Exam  Constitutional: She is oriented to person, place, and time. She appears well-developed and well-nourished.  HENT:  Head: Normocephalic and atraumatic.  Mouth/Throat: Oropharynx is clear and moist.  Eyes: Conjunctivae are normal. Pupils are equal, round, and reactive to light. Right eye exhibits no discharge. Left eye exhibits no discharge. No scleral icterus.  Neck: Normal range of motion. Neck supple.  Cardiovascular: Normal rate, regular rhythm and normal heart sounds.   Pulmonary/Chest: Effort normal. No respiratory distress.  Patient does not fully cooperate with pulmonary exam. Possible adventitious lung sounds in left lower lobe, although difficult to appreciate. Patient will not stop talking with breathing  Abdominal: Soft. She exhibits no  distension and no mass. There is no tenderness. There is no rebound and no guarding.  Musculoskeletal: She exhibits no tenderness.  Neurological: She is alert and oriented to person, place, and time.  Cranial Nerves II-XII grossly intact  Skin: Skin is warm and dry. No rash noted.  Psychiatric: She has a normal mood and affect.  Nursing note and vitals reviewed.   ED Course  Procedures (including critical care time) Labs Review Labs Reviewed  CBC WITH DIFFERENTIAL/PLATELET - Abnormal; Notable for the following:    Platelets 146 (*)    Monocytes Absolute 1.2 (*)    All other components within normal limits  COMPREHENSIVE METABOLIC PANEL - Abnormal; Notable for the following:    Chloride 97 (*)    CO2 33 (*)    Glucose, Bld 129 (*)    Creatinine, Ser 1.23 (*)    GFR calc non Af Amer 39 (*)    GFR calc Af Amer 46 (*)    All other components within normal limits  URINALYSIS,  ROUTINE W REFLEX MICROSCOPIC (NOT AT [facility]) - Abnormal; Notable for the following:    APPearance CLOUDY (*)    Hgb urine dipstick SMALL (*)    Nitrite POSITIVE (*)    Leukocytes, UA MODERATE (*)    All other components within normal limits  URINE MICROSCOPIC-ADD ON - Abnormal; Notable for the following:    Bacteria, UA MANY (*)    All other components within normal limits  URINE CULTURE  CBG MONITORING, ED    Imaging Review Dg Chest 2 View  09/17/2015  CLINICAL DATA:  Altered mental status.  Cough and chest congestion. EXAM: CHEST  2 VIEW COMPARISON:  01/05/2015 FINDINGS: There is unchanged moderate cardiomegaly. The lungs are clear. There is no pleural effusion. Mild chronic appearing central vascular prominence. IMPRESSION: Cardiomegaly and mild chronic appearing central vascular prominence. No acute findings. Electronically Signed   By: Ellery Plunk M.Cochran.   On: 09/17/2015 21:07   Ct Head Wo Contrast  09/17/2015  CLINICAL DATA:  Altered mental status. EXAM: CT HEAD WITHOUT CONTRAST TECHNIQUE:  Contiguous axial images were obtained from the base of the skull through the vertex without intravenous contrast. COMPARISON:  CT, 01/05/2015.  MRI, 01/06/2015. FINDINGS: Ventricles are normal configuration. There is ventricular enlargement greater than sulcal enlargement, which is stable likely due to a predominance of central volume loss. No convincing hydrocephalus. There are no parenchymal masses or mass effect. There is an old deep white matter lacune infarct in the right parietal lobe. Mild periventricular white matter hypoattenuation is noted consistent with chronic microvascular ischemic change. There is no evidence of a recent cortical infarct. There are no extra-axial masses or abnormal fluid collections. There is no intracranial hemorrhage. Mild mucosal thickening lines the maxillary sinuses with moderate ethmoid air cell mucosal thickening. Mild inferior frontal and sphenoid sinus mucosal thickening. There changes from right mastoidectomy. Soft tissue fills the right middle ear cavity. There is some soft tissue along the posterior left middle ear cavity. The soft tissue in the left middle ear cavity is new from the prior CT. Changes on the right are stable. IMPRESSION: 1. No acute intracranial abnormalities. 2. Atrophy, old deep white matter right parietal lobe infarct and mild chronic microvascular ischemic change 3. Sinus mucosal thickening as described. Chronic soft tissue opacification of right middle ear cavity. New abnormal soft tissue in the left middle ear cavity. There is no associated bone resorption. Consider followup temporal bone CT on a nonemergent basis if the patient has hearing loss or left temporal/ear pain. Electronically Signed   By: Amie Portland M.Cochran.   On: 09/17/2015 22:14   I have personally reviewed and evaluated these images and lab results as part of my medical decision-making.   EKG Interpretation   Date/Time:  Friday September 17 2015 20:23:13 EDT Ventricular Rate:   77 PR Interval:  162 QRS Duration: 91 QT Interval:  404 QTC Calculation: 457 R Axis:   55 Text Interpretation:  Sinus rhythm Borderline T abnormalities, anterior  leads Baseline wander in lead(s) V2 No significant change since last  tracing Confirmed by GOLDSTON  MD, SCOTT ([id number]) on 09/17/2015 11:00:27 PM     Meds given in ED:  Medications  cefTRIAXone (ROCEPHIN) 1 g in dextrose 5 % 50 mL IVPB (1 g Intravenous Transfusing/Transfer 09/18/15 0036)  acetaminophen (TYLENOL) tablet 650 mg (650 mg Oral Given 09/18/15 0008)    New Prescriptions   No medications on file   Filed Vitals:   09/17/15 2005 09/17/15 2309  BP:  134/52   Pulse: 70   Temp: 98.5 F (36.9 C) 101.1 F (38.4 C)  TempSrc: Oral Rectal  Resp: 18   SpO2: 95%     MDM  Jamie Cochran is a 81 y.o. female with a history of diabetes, hypertension, CVA, CHF, dementia here for evaluation of altered mental status. Patient is a poor historian, accompanied by granddaughter bedside and who contributes history of present illness. Granddaughter reports increased confusion, coughing today. Went to urgent care and they express concern for possible UTI or other GI infection. Patient found to be febrile to 101.47F. Physical exam is grossly unremarkable, although patient does appear to be more confused today. Vital signs are otherwise stable. Patient found to have evidence of UTI with nitrite positive, moderate leukocytes and many bacteria. Slight elevation in creatinine at 1.23, up from 0.85 ten days ago. CT shows no acute intracranial abnormalities. Chest x-ray shows cardiomegaly and a chronic-appearing central vascular prominence without any acute findings. At this time, believe patient would benefit from inpatient admission for IV antibiotics and further evaluation and management of UTI and elevated creatinine. Urine culture obtained, patient given 1 g Rocephin in the ED. Plan for medical admission. I discussed and reviewed this  case with my attending, Dr. Criss Alvine who agrees with medical admission Final diagnoses:  UTI (lower urinary tract infection)  Transient alteration of awareness       Joycie Peek, PA-C 09/18/15 0047  Joycie Peek, PA-C 09/18/15 [id number]  Rolland Porter, MD 10/07/15 [phone]

## 2015-09-18 ENCOUNTER — Observation Stay (HOSPITAL_COMMUNITY): Payer: Medicare Other

## 2015-09-18 DIAGNOSIS — E109 Type 1 diabetes mellitus without complications: Secondary | ICD-10-CM | POA: Diagnosis not present

## 2015-09-18 DIAGNOSIS — I519 Heart disease, unspecified: Secondary | ICD-10-CM | POA: Diagnosis not present

## 2015-09-18 DIAGNOSIS — G9340 Encephalopathy, unspecified: Principal | ICD-10-CM

## 2015-09-18 DIAGNOSIS — N390 Urinary tract infection, site not specified: Secondary | ICD-10-CM | POA: Diagnosis not present

## 2015-09-18 DIAGNOSIS — I5032 Chronic diastolic (congestive) heart failure: Secondary | ICD-10-CM | POA: Diagnosis present

## 2015-09-18 LAB — COMPREHENSIVE METABOLIC PANEL
ALT: 15 U/L (ref 14–54)
AST: 27 U/L (ref 15–41)
Albumin: 3 g/dL — ABNORMAL LOW (ref 3.5–5.0)
Alkaline Phosphatase: 37 U/L — ABNORMAL LOW (ref 38–126)
Anion gap: 10 (ref 5–15)
BILIRUBIN TOTAL: 0.6 mg/dL (ref 0.3–1.2)
BUN: 19 mg/dL (ref 6–20)
CHLORIDE: 100 mmol/L — AB (ref 101–111)
CO2: 33 mmol/L — AB (ref 22–32)
Calcium: 9.1 mg/dL (ref 8.9–10.3)
Creatinine, Ser: 1.09 mg/dL — ABNORMAL HIGH (ref 0.44–1.00)
GFR, EST AFRICAN AMERICAN: 53 mL/min — AB (ref >60)
GFR, EST NON AFRICAN AMERICAN: 46 mL/min — AB (ref >60)
GLUCOSE: 98 mg/dL (ref 65–99)
POTASSIUM: 3.2 mmol/L — AB (ref 3.5–5.1)
Sodium: 143 mmol/L (ref 135–145)
Total Protein: 6.4 g/dL — ABNORMAL LOW (ref 6.5–8.1)

## 2015-09-18 LAB — CBC
HEMATOCRIT: 35.2 % — AB (ref 36.0–46.0)
Hemoglobin: 11.3 g/dL — ABNORMAL LOW (ref 12.0–15.0)
MCH: 28.5 pg (ref 26.0–34.0)
MCHC: 32.1 g/dL (ref 30.0–36.0)
MCV: 88.9 fL (ref 78.0–100.0)
PLATELETS: 141 10*3/uL — AB (ref 150–400)
RBC: 3.96 MIL/uL (ref 3.87–5.11)
RDW: 14.6 % (ref 11.5–15.5)
WBC: 7.5 10*3/uL (ref 4.0–10.5)

## 2015-09-18 LAB — LIPID PANEL
CHOL/HDL RATIO: 4.9 ratio
CHOLESTEROL: 127 mg/dL (ref 0–200)
HDL: 26 mg/dL — AB (ref >40)
LDL Cholesterol: 72 mg/dL (ref 0–99)
Triglycerides: 146 mg/dL (ref <150)
VLDL: 29 mg/dL (ref 0–40)

## 2015-09-18 LAB — PROTIME-INR
INR: 1.24 (ref 0.00–1.49)
Prothrombin Time: 15.8 seconds — ABNORMAL HIGH (ref 11.6–15.2)

## 2015-09-18 MED ORDER — LEVOTHYROXINE SODIUM 75 MCG PO TABS
75.0000 ug | ORAL_TABLET | Freq: Every day | ORAL | Status: DC
  Administered 2015-09-18 – 2015-09-21 (×4): 75 ug via ORAL
  Filled 2015-09-18 (×4): qty 1

## 2015-09-18 MED ORDER — HYDROCOD POLST-CPM POLST ER 10-8 MG/5ML PO SUER
5.0000 mL | Freq: Once | ORAL | Status: AC
  Administered 2015-09-18: 5 mL via ORAL
  Filled 2015-09-18: qty 5

## 2015-09-18 MED ORDER — ASPIRIN 81 MG PO TABS: 81.0000 mg | ORAL_TABLET | Freq: Every day | ORAL | Status: DC

## 2015-09-18 MED ORDER — HEPARIN SODIUM (PORCINE) 5000 UNIT/ML IJ SOLN
5000.0000 [IU] | Freq: Three times a day (TID) | INTRAMUSCULAR | Status: DC
  Administered 2015-09-18 – 2015-09-21 (×11): 5000 [IU] via SUBCUTANEOUS
  Filled 2015-09-18 (×10): qty 1

## 2015-09-18 MED ORDER — DONEPEZIL HCL 5 MG PO TABS
5.0000 mg | ORAL_TABLET | Freq: Every day | ORAL | Status: DC
  Administered 2015-09-18 – 2015-09-20 (×3): 5 mg via ORAL
  Filled 2015-09-18 (×3): qty 1

## 2015-09-18 MED ORDER — LISINOPRIL 20 MG PO TABS
20.0000 mg | ORAL_TABLET | Freq: Every day | ORAL | Status: DC
  Administered 2015-09-18 – 2015-09-21 (×4): 20 mg via ORAL
  Filled 2015-09-18 (×4): qty 1

## 2015-09-18 MED ORDER — ASPIRIN 325 MG PO TABS
325.0000 mg | ORAL_TABLET | Freq: Every day | ORAL | Status: DC
  Administered 2015-09-19 – 2015-09-21 (×3): 325 mg via ORAL
  Filled 2015-09-18 (×4): qty 1

## 2015-09-18 MED ORDER — ACETAMINOPHEN 325 MG PO TABS
650.0000 mg | ORAL_TABLET | ORAL | Status: DC | PRN
  Administered 2015-09-18: 650 mg via ORAL
  Filled 2015-09-18: qty 2

## 2015-09-18 MED ORDER — CITALOPRAM HYDROBROMIDE 20 MG PO TABS
20.0000 mg | ORAL_TABLET | Freq: Every day | ORAL | Status: DC
  Administered 2015-09-18 – 2015-09-21 (×4): 20 mg via ORAL
  Filled 2015-09-18 (×4): qty 2
  Filled 2015-09-18 (×2): qty 1

## 2015-09-18 MED ORDER — SODIUM CHLORIDE 0.9 % IV BOLUS (SEPSIS)
500.0000 mL | Freq: Once | INTRAVENOUS | Status: AC
  Administered 2015-09-18: 500 mL via INTRAVENOUS

## 2015-09-18 MED ORDER — AMLODIPINE BESYLATE 5 MG PO TABS
5.0000 mg | ORAL_TABLET | Freq: Every day | ORAL | Status: DC
  Administered 2015-09-18 – 2015-09-21 (×4): 5 mg via ORAL
  Filled 2015-09-18 (×4): qty 1

## 2015-09-18 MED ORDER — STROKE: EARLY STAGES OF RECOVERY BOOK
Freq: Once | Status: AC
  Administered 2015-09-18: 02:00:00
  Filled 2015-09-18: qty 1

## 2015-09-18 MED ORDER — DEXTROSE 5 % IV SOLN
1.0000 g | INTRAVENOUS | Status: DC
  Administered 2015-09-18: 1 g via INTRAVENOUS
  Filled 2015-09-18: qty 10

## 2015-09-18 MED ORDER — ASPIRIN 300 MG RE SUPP
300.0000 mg | Freq: Every day | RECTAL | Status: DC
  Filled 2015-09-18: qty 1

## 2015-09-18 MED ORDER — FENOFIBRATE 160 MG PO TABS
160.0000 mg | ORAL_TABLET | Freq: Every day | ORAL | Status: DC
  Administered 2015-09-18 – 2015-09-21 (×4): 160 mg via ORAL
  Filled 2015-09-18 (×4): qty 1

## 2015-09-18 MED ORDER — PANTOPRAZOLE SODIUM 40 MG PO TBEC
40.0000 mg | DELAYED_RELEASE_TABLET | Freq: Every day | ORAL | Status: DC
  Administered 2015-09-18 – 2015-09-21 (×4): 40 mg via ORAL
  Filled 2015-09-18 (×4): qty 1

## 2015-09-18 MED ORDER — ACETAMINOPHEN 650 MG RE SUPP: 650.0000 mg | RECTAL | Status: DC | PRN

## 2015-09-18 MED ORDER — DARIFENACIN HYDROBROMIDE ER 7.5 MG PO TB24
7.5000 mg | ORAL_TABLET | Freq: Every day | ORAL | Status: DC
  Administered 2015-09-18 – 2015-09-21 (×3): 7.5 mg via ORAL
  Filled 2015-09-18 (×7): qty 1

## 2015-09-18 MED ORDER — CARVEDILOL 12.5 MG PO TABS
12.5000 mg | ORAL_TABLET | Freq: Two times a day (BID) | ORAL | Status: DC
  Administered 2015-09-18 – 2015-09-21 (×7): 12.5 mg via ORAL
  Filled 2015-09-18 (×7): qty 1

## 2015-09-18 NOTE — H&P (Signed)
[facility] History and Physical  Patient: Jamie Cochran  MRN: [id number]  DOB: 05-01-1930  DOS: the patient was seen and examined on 09/18/2015 PCP: Bennie Pierini, FNP  Referring physician: Dr. Criss Alvine Chief Complaint: Cough and confusion with slurred speech  HPI: Jamie Cochran is a 81 y.o. female with Past medical history of diabetes mellitus, coronary artery disease, hypertension, CVA, dyslipidemia, hypothyroidism, chronic diastolic dysfunction. The patient is presenting with complaints of confusion and slurred speech. Patient at the time of my evaluation did not have any complain. History was taken from patient's caregiver granddaughter and significant other. Patient was at her baseline when she went to sleep last night. When she woke up this morning the patient was confused. The patient was out in the hallway without any pants and the family found that the patient was significantly disoriented. After that she was significantly weak and was lying in the bed and did not woke up. After taking her regular medication the patient was significantly sleepy and drowsy on the dining table. With all the symptoms the patient was brought here for further workup. Patient was reportedly having some cough without any expectoration since last few days. No fever no chills no nausea no vomiting or choking episode. No diarrhea no constipation active bleeding. Patient denied any complaint of chest pain or abdominal pain or shortness of breath. Patient was recently started on Aricept by her PCP and also started on TriCor for cholesterol no other recent change in medications reported.   The patient is coming from home.  At her baseline ambulates without support And is independent for most of her ADL; does not manages her medication on her own.  Review of Systems: as mentioned in the history of present illness.  A comprehensive review of the other systems is negative.  Past  Medical History  Diagnosis Date  . Diabetes type 2, uncontrolled (HCC)   . Non-occlusive coronary artery disease     40% first diagonal stenosis, 50% obtuse marginal stenosis  . Hypertension   . Stroke (HCC) in 05/2012    deep right frontal perventricular deep white matter infarct + subacute left thalamic lacunar infarct  . Hyperlipidemia   . Hypothyroidism   . Osteoporosis   . Hearing loss     history of right sided cholesteotoma s/p Right tympanoplasty with ossicular reconstruction. (03/2002)  . Urinary incontinence     s/p surgeyr for ureterocele  . CHF (congestive heart failure) [facility])    Past Surgical History  Procedure Laterality Date  . Abdominal hysterectomy    . Cesarean section    . Bladder repair    . Inner ear surgery    . Tonsillectomy and adenoidectomy     Social History:  reports that she has never smoked. She does not have any smokeless tobacco history on file. She reports that she does not drink alcohol or use illicit drugs.  Allergies  Allergen Reactions  . Sulfa Antibiotics Rash    Family History  Problem Relation Age of Onset  . Heart disease Father     Prior to Admission medications   Medication Sig Start Date End Date Taking? Authorizing Provider  amLODipine (NORVASC) 5 MG tablet TAKE ONE TABLET BY MOUTH ONCE DAILY MUST  BE  SEEN  FOR  ADDITIONAL  REFILLS 06/10/15   Mary-Margaret Daphine Deutscher, FNP  aspirin 81 MG tablet Take 81 mg by mouth daily.    Historical Provider, MD  carvedilol (COREG) 25 MG tablet TAKE ONE TABLET BY MOUTH  TWICE DAILY WITH MEALS MUST  BE  SEEN  FOR  ADDITIONAL  REFILLS 06/10/15   Mary-Margaret Daphine DeutscherMartin, FNP  Cinnamon 500 MG capsule Take 500 mg by mouth 2 (two) times daily.    Historical Provider, MD  citalopram (CELEXA) 20 MG tablet TAKE ONE TABLET BY MOUTH ONCE DAILY MUST  BE  SEEN  FOR  ADDITIONAL  REFILLS 06/10/15   Mary-Margaret Daphine DeutscherMartin, FNP  donepezil (ARICEPT) 10 MG tablet Take 1 tablet (10 mg total) by mouth at bedtime. 09/07/15    Mary-Margaret Daphine DeutscherMartin, FNP  fenofibrate (TRICOR) 145 MG tablet Take 1 tablet (145 mg total) by mouth daily. 09/09/15   Mary-Margaret Daphine DeutscherMartin, FNP  furosemide (LASIX) 40 MG tablet Take 1 tablet (40 mg total) by mouth daily. 06/10/15   Mary-Margaret Daphine DeutscherMartin, FNP  glucose blood (ONE TOUCH ULTRA TEST) test strip 1 each by Other route 4 (four) times daily - after meals and at bedtime. 12/01/13   Mary-Margaret Daphine DeutscherMartin, FNP  glucose blood (ONE TOUCH ULTRA TEST) test strip Test tid. DX E10.9 08/23/15   Mary-Margaret Daphine DeutscherMartin, FNP  insulin aspart (NOVOLOG) 100 UNIT/ML injection Inject 0-15 Units into the skin 3 (three) times daily with meals. 06/03/12 06/03/13  Emory Nani Skillern McTyre, MD  insulin glargine (LANTUS) 100 UNIT/ML injection INJECT 24 UNITS SUBCUTANEOUSLY AT BEDTIME 06/10/15   Mary-Margaret Daphine DeutscherMartin, FNP  levothyroxine (SYNTHROID, LEVOTHROID) 75 MCG tablet Take 1 tablet (75 mcg total) by mouth daily. 06/10/15   Mary-Margaret Daphine DeutscherMartin, FNP  lisinopril (PRINIVIL,ZESTRIL) 20 MG tablet Take 1 tablet (20 mg total) by mouth daily. 06/10/15   Mary-Margaret Daphine DeutscherMartin, FNP  metolazone (ZAROXOLYN) 2.5 MG tablet Take on po q am for 2 days for severe swelling Patient taking differently: Take 2.5 mg by mouth daily as needed (swelling).  05/07/14   Deatra CanterWilliam J Oxford, FNP  omeprazole (PRILOSEC) 40 MG capsule Take 1 capsule (40 mg total) by mouth daily. 06/10/15   Mary-Margaret Daphine DeutscherMartin, FNP  omeprazole (PRILOSEC) 40 MG capsule TAKE ONE CAPSULE BY MOUTH ONCE DAILY 06/11/15   Mary-Margaret Daphine DeutscherMartin, FNP  potassium chloride (K-DUR) 10 MEQ tablet Take 2 tablets (20 mEq total) by mouth daily. 06/10/15   Mary-Margaret Daphine DeutscherMartin, FNP  Saxagliptin-Metformin (KOMBIGLYZE XR) 2.03-999 MG TB24 Take 1 tablet by mouth 2 (two) times daily. 06/10/15   Mary-Margaret Daphine DeutscherMartin, FNP  solifenacin (VESICARE) 10 MG tablet Take 1 tablet (10 mg total) by mouth daily. 06/10/15   Mary-Margaret Daphine DeutscherMartin, FNP    Physical Exam: Filed Vitals:   09/17/15 2005 09/17/15 2309  BP: 134/52    Pulse: 70   Temp: 98.5 F (36.9 C) 101.1 F (38.4 C)  TempSrc: Oral Rectal  Resp: 18   SpO2: 95%    General: Alert, Awake and Oriented to Time, Place and Person. Appear in mild distress Eyes: PERRL ENT: Oral Mucosa clear moist. Neck: no JVD Cardiovascular: S1 and S2 Present, no Murmur, Peripheral Pulses Present Respiratory: Bilateral Air entry equal and Decreased,  Clear to Auscultation, no Crackles, no wheezes Abdomen: Bowel Sound present, Soft and no tenderness Skin: no Rash Extremities: no Pedal edema, no calf tenderness Neurologic: Mental status AAOx3, speech normal, attention normal,  Cranial Nerves PERRL, EOM normal and present, Hard of hearing, left ear is better chronic.  Motor strength bilateral equal strength 5/5,  Sensation present to light touch,  Reflexes present knee and biceps, babinski negative,  Cerebellar test normal finger nose finger.  Labs on Admission:  CBC:  Recent Labs Lab 09/17/15 2137  WBC 9.1  NEUTROABS 5.0  HGB  12.2  HCT 37.0  MCV 89.6  PLT 146*    CMP     Component Value Date/Time   NA 142 09/17/2015 2137   NA 145* 09/07/2015 1559   K 3.7 09/17/2015 2137   CL 97* 09/17/2015 2137   CO2 33* 09/17/2015 2137   GLUCOSE 129* 09/17/2015 2137   GLUCOSE 109* 09/07/2015 1559   BUN 20 09/17/2015 2137   BUN 19 09/07/2015 1559   CREATININE 1.23* 09/17/2015 2137   CREATININE 0.73 05/13/2013 1150   CREATININE 0.8 01/06/2013   CALCIUM 9.9 09/17/2015 2137   PROT 6.8 09/17/2015 2137   PROT 6.5 09/07/2015 1559   ALBUMIN 3.5 09/17/2015 2137   ALBUMIN 4.0 09/07/2015 1559   AST 31 09/17/2015 2137   ALT 16 09/17/2015 2137   ALKPHOS 42 09/17/2015 2137   BILITOT 0.7 09/17/2015 2137   BILITOT 0.3 09/07/2015 1559   GFRNONAA 39* 09/17/2015 2137   GFRNONAA 78 05/13/2013 1150   GFRAA 46* 09/17/2015 2137   GFRAA >89 05/13/2013 1150    No results for input(s): CKTOTAL, CKMB, CKMBINDEX, TROPONINI in the last 168 hours. BNP (last 3 results) No  results for input(s): BNP in the last 8760 hours.  ProBNP (last 3 results) No results for input(s): PROBNP in the last 8760 hours.   Radiological Exams on Admission: Dg Chest 2 View  09/17/2015  CLINICAL DATA:  Altered mental status.  Cough and chest congestion. EXAM: CHEST  2 VIEW COMPARISON:  01/05/2015 FINDINGS: There is unchanged moderate cardiomegaly. The lungs are clear. There is no pleural effusion. Mild chronic appearing central vascular prominence. IMPRESSION: Cardiomegaly and mild chronic appearing central vascular prominence. No acute findings. Electronically Signed   By: Ellery Plunk M.D.   On: 09/17/2015 21:07   Ct Head Wo Contrast  09/17/2015  CLINICAL DATA:  Altered mental status. EXAM: CT HEAD WITHOUT CONTRAST TECHNIQUE: Contiguous axial images were obtained from the base of the skull through the vertex without intravenous contrast. COMPARISON:  CT, 01/05/2015.  MRI, 01/06/2015. FINDINGS: Ventricles are normal configuration. There is ventricular enlargement greater than sulcal enlargement, which is stable likely due to a predominance of central volume loss. No convincing hydrocephalus. There are no parenchymal masses or mass effect. There is an old deep white matter lacune infarct in the right parietal lobe. Mild periventricular white matter hypoattenuation is noted consistent with chronic microvascular ischemic change. There is no evidence of a recent cortical infarct. There are no extra-axial masses or abnormal fluid collections. There is no intracranial hemorrhage. Mild mucosal thickening lines the maxillary sinuses with moderate ethmoid air cell mucosal thickening. Mild inferior frontal and sphenoid sinus mucosal thickening. There changes from right mastoidectomy. Soft tissue fills the right middle ear cavity. There is some soft tissue along the posterior left middle ear cavity. The soft tissue in the left middle ear cavity is new from the prior CT. Changes on the right are  stable. IMPRESSION: 1. No acute intracranial abnormalities. 2. Atrophy, old deep white matter right parietal lobe infarct and mild chronic microvascular ischemic change 3. Sinus mucosal thickening as described. Chronic soft tissue opacification of right middle ear cavity. New abnormal soft tissue in the left middle ear cavity. There is no associated bone resorption. Consider followup temporal bone CT on a nonemergent basis if the patient has hearing loss or left temporal/ear pain. Electronically Signed   By: Amie Portland M.D.   On: 09/17/2015 22:14   EKG: Independently reviewed. normal sinus rhythm, nonspecific ST and T waves  changes.  Assessment/Plan 1. Acute encephalopathy Patient presents with complaints of episodes of confusion. Patient also having some slurred speech earlier in the morning. At the time of my evaluation the patient is alert awake and oriented 3 and does not have any acute complaint also no focal deficit. CT of the head is unremarkable. Patient's urine appears to be having some increased WBC. Patient is febrile with leukocytosis. Thus most likely the patient is appearing to be having some acute infection most likely UTI. Although significantly less likely, Possibility of CVA cannot be ruled out and therefore I will check MRI of the brain and stroke swallowing. PTOT and speech evaluation.  2. Hypertension Continue carvedilol.  3  Hyperlipidemia Continue tricor  4  Hypothyroidism Continue synthroid  5  Diabetes mellitus (HCC) Holding long acting insulin Continue on sliding scale  6  UTI (lower urinary tract infection) most likely cause of current presentation Continue ceftriaxone  7  Diastolic dysfunction Recent ECHO ef 55% Hold lasix.  Nutrition: npo pending swallowing eval DVT Prophylaxis: subcutaneous Heparin  Advance goals of care discussion: full code  Family Communication: family was present at bedside, opportunity was given to ask question and all  questions were answered satisfactorily at the time of interview. Disposition: Admitted as observation, telemetry unit.  Author: Lynden Oxford, MD [facility] Pager: [phone] 09/18/2015  If 7PM-7AM, please contact night-coverage [email or web address] Password [email or web address]
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
## 2015-09-18 NOTE — Progress Notes (Signed)
Patient seen and examined. Admitted after midnight secondary to acute encephalopathy and transient episode of slurred speech. Patient with prior hx of diabetes mellitus, coronary artery disease, hypertension, CVA, dyslipidemia, hypothyroidism, chronic diastolic dysfunction. Found to have UTI and no acute abnormalities on CT head or MRI. hard of hearing on exam; otherwise hemodynamically stable and AAOX2. Please refer to H&P written by Dr. Allena KatzPatel for further info/details on admission.  Jamie LollMadera, Jamie Cochran [id number]

## 2015-09-18 NOTE — Progress Notes (Signed)
NURSING PROGRESS NOTE  Jamie Cochran [id number] Admission Data: 09/18/2015 4:12 AM Attending Provider: Rolly SalterPranav M Patel, MD WUJ:[id number],NWGNPCP:MARTIN,MARY MARGARET, FNP Code Status: Full  Jamie Cochran is a 81 y.o. female patient admitted from ED:  -No acute distress noted.  -No complaints of shortness of breath.  -No complaints of chest pain.   Cardiac Monitoring: Box # 14 in place. Cardiac monitor yields:normal sinus rhythm.  Blood pressure 173/59, pulse 64, temperature 98.3 F (36.8 C), temperature source Oral, resp. rate 20, height 4\' 8"  (1.422 m), weight 60.1 kg (132 lb 7.9 oz), last menstrual period 02/04/1963, SpO2 98 %.   IV Fluids:  IV in place, occlusive dsg intact without redness, IV cath antecubital right, condition patent and no redness.   Allergies:  Sulfa antibiotics  Past Medical History:   has a past medical history of Diabetes type 2, uncontrolled (HCC); Non-occlusive coronary artery disease; Hypertension; Stroke (HCC) (in 05/2012); Hyperlipidemia; Hypothyroidism; Osteoporosis; Hearing loss; Urinary incontinence; and CHF (congestive heart failure) (HCC).  Past Surgical History:   has past surgical history that includes Abdominal hysterectomy; Cesarean section; Bladder repair; Inner ear surgery; and Tonsillectomy and adenoidectomy.  Social History:   reports that she has never smoked. She does not have any smokeless tobacco history on file. She reports that she does not drink alcohol or use illicit drugs.  Skin: abrasion to L anterior LE  Patient/Family orientated to room. Information packet given to patient/family. Admission inpatient armband information verified with patient/family to include name and date of birth and placed on patient arm. Side rails up x 2, fall assessment and education completed with patient/family. Patient/family able to verbalize understanding of risk associated with falls and verbalized understanding to call for assistance before getting out of  bed. Call light within reach. Patient/family able to voice and demonstrate understanding of unit orientation instructions.

## 2015-09-19 ENCOUNTER — Observation Stay (HOSPITAL_COMMUNITY): Payer: Medicare Other

## 2015-09-19 DIAGNOSIS — N390 Urinary tract infection, site not specified: Secondary | ICD-10-CM | POA: Diagnosis not present

## 2015-09-19 DIAGNOSIS — I10 Essential (primary) hypertension: Secondary | ICD-10-CM | POA: Diagnosis not present

## 2015-09-19 DIAGNOSIS — E039 Hypothyroidism, unspecified: Secondary | ICD-10-CM

## 2015-09-19 DIAGNOSIS — R05 Cough: Secondary | ICD-10-CM | POA: Diagnosis not present

## 2015-09-19 DIAGNOSIS — E785 Hyperlipidemia, unspecified: Secondary | ICD-10-CM | POA: Diagnosis not present

## 2015-09-19 DIAGNOSIS — I519 Heart disease, unspecified: Secondary | ICD-10-CM | POA: Diagnosis not present

## 2015-09-19 DIAGNOSIS — I5022 Chronic systolic (congestive) heart failure: Secondary | ICD-10-CM | POA: Diagnosis not present

## 2015-09-19 DIAGNOSIS — G9340 Encephalopathy, unspecified: Secondary | ICD-10-CM | POA: Diagnosis not present

## 2015-09-19 DIAGNOSIS — I5032 Chronic diastolic (congestive) heart failure: Secondary | ICD-10-CM

## 2015-09-19 MED ORDER — LEVOFLOXACIN IN D5W 500 MG/100ML IV SOLN
500.0000 mg | INTRAVENOUS | Status: DC
  Administered 2015-09-19: 500 mg via INTRAVENOUS
  Filled 2015-09-19 (×2): qty 100

## 2015-09-19 MED ORDER — POTASSIUM CHLORIDE CRYS ER 20 MEQ PO TBCR
20.0000 meq | EXTENDED_RELEASE_TABLET | Freq: Every day | ORAL | Status: AC
  Administered 2015-09-19 – 2015-09-21 (×3): 20 meq via ORAL
  Filled 2015-09-19 (×3): qty 1

## 2015-09-19 MED ORDER — FUROSEMIDE 20 MG PO TABS
20.0000 mg | ORAL_TABLET | Freq: Every day | ORAL | Status: DC
  Administered 2015-09-19 – 2015-09-21 (×3): 20 mg via ORAL
  Filled 2015-09-19 (×3): qty 1

## 2015-09-19 NOTE — Progress Notes (Signed)
[facility] PROGRESS NOTE  Jamie Cochran ZOX:[id number] DOB: Sep 23, 1930 DOA: 09/17/2015 PCP: Bennie Pierini, FNP  Assessment/Plan: 1. Acute encephalopathy -CT and MRI w/o acute abnormalities  -most likely due to UTI on top of underlying dementia -will continue treatment for UTI -patient is oriented X 2; unknown baseline and no family during examination -PT recommending HH service  -will follow clinical response   2. Hypertension -stable and well controlled -will continue lasix and carvedilol.  3 Dyslipidemia -Continue tricor  4 Hypothyroidism -Continue synthroid  5 Diabetes mellitus (HCC) -Continue on sliding scale -follow CBG's and adjust hypoglycemic regimen as needed  6 UTI (lower urinary tract infection) -still spiking fever -will cover with levaquin -follow culture data  7 Diastolic dysfunction -Recent ECHO ef 55% -will follow daily weight and strict intake/output -will resume lasix. -low sodium diet   Code Status: Full Family Communication: no family at bedside Disposition Plan: per PT rec's ok to return home with [facility] services and supervision 24/7   Consultants:  None   Procedures:  See below for x-ray reports   Antibiotics:  Rocephin 11/04>>>11/06  Levaquin 11/06  HPI/Subjective: Still spiking fever, complaining of intermittent coughing spells, no nausea, no vomiting and no abd pain  Objective: Filed Vitals:   09/19/15 0728  BP: 121/61  Pulse: 55  Temp: 98.5 F (36.9 C)  Resp: 20    Intake/Output Summary (Last 24 hours) at 09/19/15 1112 Last data filed at 09/19/15 0940  Gross per 24 hour  Intake    710 ml  Output      0 ml  Net    710 ml   Filed Weights   09/18/15 0120  Weight: 60.1 kg (132 lb 7.9 oz)    Exam:   General:  High grade temp overnight, with intermittent cough, no CP and more alert. Very hard of hearing   Cardiovascular: S1 and S2, positive murmur, no rubs or gallops  Respiratory:  positive rhonchi, no wheezing; decrease BS at bases  Abdomen: soft, NT, ND, positive BS  Musculoskeletal: no edema or cyanosis   Data Reviewed: Basic Metabolic Panel:  Recent Labs Lab 09/17/15 2137 09/18/15 0417  NA 142 143  K 3.7 3.2*  CL 97* 100*  CO2 33* 33*  GLUCOSE 129* 98  BUN 20 19  CREATININE 1.23* 1.09*  CALCIUM 9.9 9.1   Liver Function Tests:  Recent Labs Lab 09/17/15 2137 09/18/15 0417  AST 31 27  ALT 16 15  ALKPHOS 42 37*  BILITOT 0.7 0.6  PROT 6.8 6.4*  ALBUMIN 3.5 3.0*   CBC:  Recent Labs Lab 09/17/15 2137 09/18/15 0417  WBC 9.1 7.5  NEUTROABS 5.0  --   HGB 12.2 11.3*  HCT 37.0 35.2*  MCV 89.6 88.9  PLT 146* 141*    ProBNP (last 3 results) No results for input(s): PROBNP in the last 8760 hours.  CBG: No results for input(s): GLUCAP in the last 168 hours.  Recent Results (from the past 240 hour(s))  Culture, blood (routine x 2)     Status: None (Preliminary result)   Collection Time: 09/18/15 10:10 PM  Result Value Ref Range Status   Specimen Description BLOOD LEFT ARM  Final   Special Requests BOTTLES DRAWN AEROBIC ONLY 7CC  Final   Culture NO GROWTH < 12 HOURS  Final   Report Status PENDING  Incomplete  Culture, blood (routine x 2)     Status: None (Preliminary result)   Collection Time: 09/18/15 10:20 PM  Result Value  Ref Range Status   Specimen Description BLOOD RIGHT HAND  Final   Special Requests BOTTLES DRAWN AEROBIC AND ANAEROBIC 5CC  Final   Culture NO GROWTH < 12 HOURS  Final   Report Status PENDING  Incomplete     Studies: Dg Chest 2 View  09/19/2015  CLINICAL DATA:  Fever. EXAM: CHEST  2 VIEW COMPARISON:  09/17/2015 FINDINGS: Heart size is normal. There is a moderate left pleural effusion which appears partially loculated and is increased in volume from previous exam. Diminished aeration to the left base noted. Stable chronic compression deformity within the lower thoracic or upper lumbar spine. IMPRESSION: 1. Increase  in volume of loculated left pleural effusion with worsening aeration to the left base. Electronically Signed   By: Signa Kellaylor  Stroud M.D.   On: 09/19/2015 08:48   Dg Chest 2 View  09/17/2015  CLINICAL DATA:  Altered mental status.  Cough and chest congestion. EXAM: CHEST  2 VIEW COMPARISON:  01/05/2015 FINDINGS: There is unchanged moderate cardiomegaly. The lungs are clear. There is no pleural effusion. Mild chronic appearing central vascular prominence. IMPRESSION: Cardiomegaly and mild chronic appearing central vascular prominence. No acute findings. Electronically Signed   By: Ellery Plunkaniel R Mitchell M.D.   On: 09/17/2015 21:07   Ct Head Wo Contrast  09/17/2015  CLINICAL DATA:  Altered mental status. EXAM: CT HEAD WITHOUT CONTRAST TECHNIQUE: Contiguous axial images were obtained from the base of the skull through the vertex without intravenous contrast. COMPARISON:  CT, 01/05/2015.  MRI, 01/06/2015. FINDINGS: Ventricles are normal configuration. There is ventricular enlargement greater than sulcal enlargement, which is stable likely due to a predominance of central volume loss. No convincing hydrocephalus. There are no parenchymal masses or mass effect. There is an old deep white matter lacune infarct in the right parietal lobe. Mild periventricular white matter hypoattenuation is noted consistent with chronic microvascular ischemic change. There is no evidence of a recent cortical infarct. There are no extra-axial masses or abnormal fluid collections. There is no intracranial hemorrhage. Mild mucosal thickening lines the maxillary sinuses with moderate ethmoid air cell mucosal thickening. Mild inferior frontal and sphenoid sinus mucosal thickening. There changes from right mastoidectomy. Soft tissue fills the right middle ear cavity. There is some soft tissue along the posterior left middle ear cavity. The soft tissue in the left middle ear cavity is new from the prior CT. Changes on the right are stable.  IMPRESSION: 1. No acute intracranial abnormalities. 2. Atrophy, old deep white matter right parietal lobe infarct and mild chronic microvascular ischemic change 3. Sinus mucosal thickening as described. Chronic soft tissue opacification of right middle ear cavity. New abnormal soft tissue in the left middle ear cavity. There is no associated bone resorption. Consider followup temporal bone CT on a nonemergent basis if the patient has hearing loss or left temporal/ear pain. Electronically Signed   By: Amie Portlandavid  Ormond M.D.   On: 09/17/2015 22:14   Mr Brain Wo Contrast  09/18/2015  CLINICAL DATA:  Confusion and slurred speech. History of diabetes, hypertension, high dyslipidemia, stroke. EXAM: MRI HEAD WITHOUT CONTRAST TECHNIQUE: Multiplanar, multiecho pulse sequences of the brain and surrounding structures were obtained without intravenous contrast. COMPARISON:  CT head September 17, 2015 and MRI of the head January 06, 2015 FINDINGS: Moderate to severe ventriculomegaly on the basis of global parenchymal brain volume loss, stable from prior imaging. Old RIGHT thalamus lacunar infarct. Patchy supratentorial white matter T2 hyperintensities. No midline shift, mass effect or mass lesions. No abnormal extra-axial  fluid collections. Normal major intracranial vascular flow voids observed at the skull base. Moderate paranasal sinus mucosal thickening without air-fluid levels. Small LEFT mastoid effusion. Status post wall down RIGHT mastoidectomy with soft tissue in the surgical bed, unchanged. No abnormal sellar expansion. No cerebellar tonsillar ectopia. Generalized decreased T1 bone marrow signal, nonspecific and, unchanged. Patient is edentulous. IMPRESSION: No acute intracranial process, specifically no acute ischemia. Stable moderate to severe global brain atrophy. Moderate chronic small vessel ischemic disease and old LEFT thalamus lacunar infarct. Electronically Signed   By: Awilda Metro M.D.   On: 09/18/2015  05:31    Scheduled Meds: . amLODipine  5 mg Oral Daily  . aspirin  300 mg Rectal Daily   Or  . aspirin  325 mg Oral Daily  . carvedilol  12.5 mg Oral BID WC  . citalopram  20 mg Oral Daily  . darifenacin  7.5 mg Oral Daily  . donepezil  5 mg Oral QHS  . fenofibrate  160 mg Oral Daily  . heparin  5,000 Units Subcutaneous 3 times per day  . levofloxacin (LEVAQUIN) IV  500 mg Intravenous Q48H  . levothyroxine  75 mcg Oral QAC breakfast  . lisinopril  20 mg Oral Daily  . pantoprazole  40 mg Oral Daily   Continuous Infusions:   Principal Problem:   Acute encephalopathy Active Problems:   Hypertension   Hyperlipidemia   Hypothyroidism   Diabetes mellitus (HCC)   UTI (lower urinary tract infection)   Diastolic dysfunction    Time spent: 30 minutes    Vassie Loll  [facility] Pager [phone]. If 7PM-7AM, please contact night-coverage at [email or web address], password [facility] 09/19/2015, 11:12 AM

## 2015-09-19 NOTE — Progress Notes (Signed)
Pt spiked temp of 102.2. On-call provider Merdis DelayK Schorr, NP notified via text page. 500 cc NS bolus given. Blood CS ordered. Provider also made aware of intermittent coughing . Order received for CXR  In am. Will cont to monitor.

## 2015-09-20 DIAGNOSIS — I2510 Atherosclerotic heart disease of native coronary artery without angina pectoris: Secondary | ICD-10-CM | POA: Diagnosis present

## 2015-09-20 DIAGNOSIS — N390 Urinary tract infection, site not specified: Secondary | ICD-10-CM | POA: Diagnosis present

## 2015-09-20 DIAGNOSIS — E039 Hypothyroidism, unspecified: Secondary | ICD-10-CM | POA: Diagnosis present

## 2015-09-20 DIAGNOSIS — Z8673 Personal history of transient ischemic attack (TIA), and cerebral infarction without residual deficits: Secondary | ICD-10-CM | POA: Diagnosis not present

## 2015-09-20 DIAGNOSIS — I5030 Unspecified diastolic (congestive) heart failure: Secondary | ICD-10-CM | POA: Diagnosis present

## 2015-09-20 DIAGNOSIS — I5022 Chronic systolic (congestive) heart failure: Secondary | ICD-10-CM | POA: Diagnosis not present

## 2015-09-20 DIAGNOSIS — R404 Transient alteration of awareness: Secondary | ICD-10-CM | POA: Diagnosis present

## 2015-09-20 DIAGNOSIS — G9340 Encephalopathy, unspecified: Secondary | ICD-10-CM | POA: Diagnosis present

## 2015-09-20 DIAGNOSIS — I110 Hypertensive heart disease with heart failure: Secondary | ICD-10-CM | POA: Diagnosis present

## 2015-09-20 DIAGNOSIS — I519 Heart disease, unspecified: Secondary | ICD-10-CM | POA: Diagnosis not present

## 2015-09-20 DIAGNOSIS — F0390 Unspecified dementia without behavioral disturbance: Secondary | ICD-10-CM | POA: Diagnosis present

## 2015-09-20 DIAGNOSIS — R05 Cough: Secondary | ICD-10-CM | POA: Diagnosis not present

## 2015-09-20 DIAGNOSIS — E11649 Type 2 diabetes mellitus with hypoglycemia without coma: Secondary | ICD-10-CM | POA: Diagnosis present

## 2015-09-20 DIAGNOSIS — H9190 Unspecified hearing loss, unspecified ear: Secondary | ICD-10-CM | POA: Diagnosis present

## 2015-09-20 DIAGNOSIS — E785 Hyperlipidemia, unspecified: Secondary | ICD-10-CM | POA: Diagnosis present

## 2015-09-20 DIAGNOSIS — B9620 Unspecified Escherichia coli [E. coli] as the cause of diseases classified elsewhere: Secondary | ICD-10-CM | POA: Diagnosis present

## 2015-09-20 DIAGNOSIS — M810 Age-related osteoporosis without current pathological fracture: Secondary | ICD-10-CM | POA: Diagnosis present

## 2015-09-20 DIAGNOSIS — R4781 Slurred speech: Secondary | ICD-10-CM | POA: Diagnosis present

## 2015-09-20 LAB — URINE CULTURE

## 2015-09-20 LAB — BASIC METABOLIC PANEL
ANION GAP: 10 (ref 5–15)
BUN: 17 mg/dL (ref 6–20)
CO2: 27 mmol/L (ref 22–32)
CREATININE: 1.01 mg/dL — AB (ref 0.44–1.00)
Calcium: 8.3 mg/dL — ABNORMAL LOW (ref 8.9–10.3)
Chloride: 97 mmol/L — ABNORMAL LOW (ref 101–111)
GFR calc non Af Amer: 50 mL/min — ABNORMAL LOW (ref >60)
GFR, EST AFRICAN AMERICAN: 58 mL/min — AB (ref >60)
GLUCOSE: 158 mg/dL — AB (ref 65–99)
Potassium: 3.2 mmol/L — ABNORMAL LOW (ref 3.5–5.1)
SODIUM: 134 mmol/L — AB (ref 135–145)

## 2015-09-20 LAB — HEMOGLOBIN A1C
HEMOGLOBIN A1C: 6.5 % — AB (ref 4.8–5.6)
MEAN PLASMA GLUCOSE: 140 mg/dL

## 2015-09-20 MED ORDER — GUAIFENESIN-DM 100-10 MG/5ML PO SYRP
5.0000 mL | ORAL_SOLUTION | ORAL | Status: DC | PRN
  Administered 2015-09-20 (×2): 5 mL via ORAL
  Filled 2015-09-20 (×3): qty 5

## 2015-09-20 MED ORDER — CEFUROXIME AXETIL 500 MG PO TABS
500.0000 mg | ORAL_TABLET | Freq: Two times a day (BID) | ORAL | Status: DC
  Administered 2015-09-21 (×2): 500 mg via ORAL
  Filled 2015-09-20 (×4): qty 1

## 2015-09-20 NOTE — Progress Notes (Signed)
Physical Therapy Treatment Patient Details Name: Jamie Cochran MRN: [id number] DOB: 12-17-1929 Today's Date: 09/20/2015    History of Present Illness 81 y.o. female with new complaint of cough, confusion and slurred speech.  Past medical history of diabetes mellitus, coronary artery disease, hypertension, CVA, dyslipidemia, hypothyroidism, chronic diastolic dysfunction.    PT Comments    Pt making good progress.   Follow Up Recommendations  Supervision/Assistance - 24 hour     Equipment Recommendations  None recommended by PT    Recommendations for Other Services       Precautions / Restrictions Precautions Precautions: Fall Restrictions Weight Bearing Restrictions: No    Mobility  Bed Mobility Overal bed mobility: Needs Assistance Bed Mobility: Supine to Sit     Supine to sit: Mod assist     General bed mobility comments: required verbal and tactile cues for sequencing with bed mobility and supine to sit  Transfers Overall transfer level: Needs assistance Equipment used: Rolling walker (2 wheeled);None Transfers: Sit to/from Stand Sit to Stand: Min guard         General transfer comment: Assist for safety  Ambulation/Gait Ambulation/Gait assistance: Min assist Ambulation Distance (Feet): 150 Feet Assistive device: Rolling walker (2 wheeled);1 person hand held assist;None Gait Pattern/deviations: Step-through pattern;Decreased stride length;Drifts right/left   Gait velocity interpretation: Below normal speed for age/gender General Gait Details: Initially used rolling walker but pt with some difficulty manuevering it. Left walker and used hand held assist and then progressed to no assistive device   Stairs            Wheelchair Mobility    Modified Rankin (Stroke Patients Only)       Balance   Sitting-balance support: No upper extremity supported;Feet supported Sitting balance-Leahy Scale: Fair     Standing balance support: No  upper extremity supported Standing balance-Leahy Scale: Fair                      Cognition Arousal/Alertness: Awake/alert Behavior During Therapy: WFL for tasks assessed/performed Overall Cognitive Status: No family/caregiver present to determine baseline cognitive functioning       Memory: Decreased short-term memory              Exercises      General Comments        Pertinent Vitals/Pain Pain Assessment: No/denies pain    Home Living Family/patient expects to be discharged to:: Private residence Living Arrangements: Other relatives (grandchildren) Available Help at Discharge: Family;Available 24 hours/day Type of Home: House Home Access: Stairs to enter Entrance Stairs-Rails: Right Home Layout: One level Home Equipment: Walker - 2 wheels;Bedside commode;Wheelchair - manual Additional Comments: Prior home level taken from Feb 2016 admission, no family present on evaluation, pt's responses matching previous information    Prior Function            PT Goals (current goals can now be found in the care plan section) Acute Rehab PT Goals Patient Stated Goal: to return home Progress towards PT goals: Progressing toward goals    Frequency  Min 3X/week    PT Plan Current plan remains appropriate    Co-evaluation             End of Session Equipment Utilized During Treatment: Gait belt Activity Tolerance: Patient tolerated treatment well Patient left: with call bell/phone within reach;in chair;with chair alarm set     Time: 1219-1231 PT Time Calculation (min) (ACUTE ONLY): 12 min  Charges:  $Gait Training: 8-22 mins  G Codes:      Rahul Malinak 09/20/2015, 1:33 PM [facility] Yamile Roedl PT [phone]

## 2015-09-20 NOTE — Evaluation (Signed)
Occupational Therapy Evaluation Patient Details Name: BRYNLEIGH SEQUEIRA MRN: [id number] DOB: [birth year] Today's Date: 09/20/2015    History of Present Illness 81 y.o. female with new complaint of cough, confusion and slurred speech.  Past medical history of diabetes mellitus, coronary artery disease, hypertension, CVA, dyslipidemia, hypothyroidism, chronic diastolic dysfunction.   Clinical Impression    Pt admitted with above diagnosis and demonstrating deficits listed below (see OT Problem List).  Pt very hard of hearing and confused, no family present therefore PLOF information used from hospitalization in Feb 2016.  Pt reports she is in the hospital due to a "bad cough" causing her stomach pain and that the doctors aren't sure what is wrong with her.  Pt overall min assist with mobility and functional transfers this session.  Pt would benefit from OT acutely to increase independence and safety with ADLs prior to d/c home with HHOT and 24 hr supervision.  Follow Up Recommendations  Home health OT;Supervision/Assistance - 24 hour    Equipment Recommendations  None recommended by OT    Recommendations for Other Services       Precautions / Restrictions Precautions Precautions: Fall Restrictions Weight Bearing Restrictions: No      Mobility Bed Mobility Overal bed mobility: Needs Assistance Bed Mobility: Supine to Sit     Supine to sit: Mod assist     General bed mobility comments: required verbal and tactile cues for sequencing with bed mobility and supine to sit  Transfers Overall transfer level: Needs assistance Equipment used: Rolling walker (2 wheeled) Transfers: Sit to/from Stand Sit to Stand: Min assist;Min guard         General transfer comment: decreased safety insight, required cues for hand placement with sit > stand         ADL Overall ADL's : Needs assistance/impaired                     Lower Body Dressing: Minimal assistance;Sit  to/from stand Lower Body Dressing Details (indicate cue type and reason): doffed and donned socks seated at EOB with supervision for sitting balance, min/steady assist for standing balance Toilet Transfer: BSC;RW;Minimal assistance           Functional mobility during ADLs: Minimal assistance;Rolling walker                 Pertinent Vitals/Pain Pain Assessment: No/denies pain     Hand Dominance Right   Extremity/Trunk Assessment Upper Extremity Assessment Upper Extremity Assessment: Overall WFL for tasks assessed (LUE mildly weaker than RUE)   Lower Extremity Assessment Lower Extremity Assessment: Overall WFL for tasks assessed       Communication Communication Communication: HOH   Cognition Arousal/Alertness: Awake/alert Behavior During Therapy: WFL for tasks assessed/performed Overall Cognitive Status: No family/caregiver present to determine baseline cognitive functioning (uncertain if close to baseline)                                Home Living Family/patient expects to be discharged to:: Private residence Living Arrangements: Other relatives (grandchildren) Available Help at Discharge: Family;Available 24 hours/day Type of Home: House Home Access: Stairs to enter [employer] of Steps: 2-3 Entrance Stairs-Rails: Right Home Layout: One level     Bathroom Shower/Tub: Tub/shower unit Shower/tub characteristics: Curtain Firefighter: Standard     Home Equipment: Environmental consultant - 2 wheels;Bedside commode;Wheelchair - manual   Additional Comments: Prior home level taken from Feb 2016 admission, no  family present on evaluation, pt's responses matching previous information      Prior Functioning/Environment   Unsure, as no family present on eval to confirm            OT Diagnosis: Generalized weakness;Altered mental status   OT Problem List: Decreased strength;Decreased activity tolerance;Impaired balance (sitting and/or  standing);Decreased safety awareness;Decreased knowledge of use of DME or AE;Decreased cognition   OT Treatment/Interventions: Self-care/ADL training;Energy conservation;DME and/or AE instruction;Therapeutic activities;Patient/family education;Balance training;Cognitive remediation/compensation    OT Goals(Current goals can be found in the care plan section) Acute Rehab OT Goals Patient Stated Goal: to return home OT Goal Formulation: With patient Time For Goal Achievement: 10/04/15 Potential to Achieve Goals: Good  OT Frequency: Min 2X/week   Barriers to D/C:  (unsure, no family present for eval)             End of Session Equipment Utilized During Treatment: Gait belt;Rolling walker Nurse Communication: Mobility status  Activity Tolerance: Patient tolerated treatment well;No increased pain Patient left: in chair;with chair alarm set   Time: [phone] OT Time Calculation (min): 17 min Charges:  OT General Charges $OT Visit: 1 Procedure OT Evaluation $Initial OT Evaluation Tier I: 1 Procedure G-Codes: OT G-codes **NOT FOR INPATIENT CLASS** Functional Assessment Tool Used: clinical judgement Functional Limitation: Self care Self Care Current Status ([id number]): At least 20 percent but less than 40 percent impaired, limited or restricted Self Care Goal Status (J1914(G8988): At least 1 percent but less than 20 percent impaired, limited or restricted  Rosalio LoudHOXIE, Braylin Xu, [phone] 09/20/2015, 12:36 PM

## 2015-09-20 NOTE — Evaluation (Signed)
Speech Language Pathology Evaluation Patient Details Name: Glenford Peershyllis D Hohler MRN: [id number] DOB: 26-Jun-1930 Today's Date: 09/20/2015 Time: [id number] SLP Time Calculation (min) (ACUTE ONLY): 21 min  Problem List:  Patient Active Problem List   Diagnosis Date Noted  . UTI (lower urinary tract infection) 09/18/2015  . Acute encephalopathy 09/18/2015  . Diastolic dysfunction 09/18/2015  . Dysphagia, pharyngoesophageal phase 01/06/2015  . Diabetes mellitus type 2, controlled (HCC) 01/05/2015  . Right sided weakness 05/28/2012  . Hypertension 02/02/2011  . Osteoporosis 02/02/2011  . Hyperlipidemia 02/02/2011  . Hypothyroidism 02/02/2011  . Diabetes mellitus (HCC) 02/02/2011  . Stress incontinence, female 02/02/2011  . Osteoarthritis 02/02/2011  . Congestive heart failure (HCC) 02/02/2011   Past Medical History:  Past Medical History  Diagnosis Date  . Diabetes type 2, uncontrolled (HCC)   . Non-occlusive coronary artery disease     40% first diagonal stenosis, 50% obtuse marginal stenosis  . Hypertension   . Stroke (HCC) in 05/2012    deep right frontal perventricular deep white matter infarct + subacute left thalamic lacunar infarct  . Hyperlipidemia   . Hypothyroidism   . Osteoporosis   . Hearing loss     history of right sided cholesteotoma s/p Right tympanoplasty with ossicular reconstruction. (03/2002)  . Urinary incontinence     s/p surgeyr for ureterocele  . CHF (congestive heart failure) (HCC)    Past Surgical History:  Past Surgical History  Procedure Laterality Date  . Abdominal hysterectomy    . Cesarean section    . Bladder repair    . Inner ear surgery    . Tonsillectomy and adenoidectomy     HPI:  81 y.o. female with new complaint of cough, confusion and slurred speech. Past medical history of hard of hearing, diabetes mellitus, coronary artery disease, hypertension, CVA, dyslipidemia, hypothyroidism, chronic diastolic dysfunction.MRI no acute  intracranial process, specifically no acute ischemia. Stable moderate to severe global brain atrophy. Moderate chronic small vessel ischemic disease and old LEFT thalamus lacunar infarct.    Assessment / Plan / Recommendation Clinical Impression  Family not present to provide baseline versus current cognitive status. Subtests from the Cognistat assessment given revealing deficits in the areas of situational and spatial orientation, problem solving, memory and reasoning. Decreasing hearing acuity likely affects listener's perception of comprehension. ST will briefly follow to establish baseline function and needs (suspect she may be close to baseline).     SLP Assessment  Patient needs continued Speech Lanaguage Pathology Services    Follow Up Recommendations  None    Frequency and Duration min 1 x/week  1 week   Pertinent Vitals/Pain Pain Assessment: No/denies pain   SLP Goals  Potential to Achieve Goals (ACUTE ONLY):  (fair-good) Potential Considerations (ACUTE ONLY): Previous level of function;Ability to learn/carryover information  SLP Evaluation Prior Functioning  Cognitive/Linguistic Baseline:  (suspect baseline deficits, no family present, mod ischemic d) Type of Home: House   Cognition  Overall Cognitive Status: No family/caregiver present to determine baseline cognitive functioning (uncertain if close to baseline) Arousal/Alertness: Awake/alert Orientation Level: Oriented to person;Disoriented to place;Disoriented to situation;Oriented to time Attention: Sustained Sustained Attention: Appears intact Memory: Impaired Memory Impairment: Storage deficit;Decreased short term memory;Decreased recall of new information (not retain correct city with 2 m in delay) Decreased Short Term Memory: Verbal basic Awareness: Impaired Awareness Impairment: Intellectual impairment;Emergent impairment;Anticipatory impairment Problem Solving: Impaired Problem Solving Impairment: Verbal  basic Safety/Judgment:  (asking who to call for bathroom)    Comprehension  Auditory Comprehension Overall Auditory  Comprehension: Impaired Yes/No Questions: Within Functional Limits Commands: Impaired Multistep Basic Commands: 25-49% accurate Conversation: Simple Interfering Components: Working Theatre manager: Not tested Reading Comprehension Reading Status: Not tested    Expression Expression Primary Mode of Expression: Verbal Verbal Expression Overall Verbal Expression: Appears within functional limits for tasks assessed Initiation: No impairment Level of Generative/Spontaneous Verbalization: Conversation Repetition:  (NT) Naming:  (word finding prob evident) Pragmatics: No impairment Written Expression Dominant Hand: Right Written Expression: Not tested   Oral / Motor Oral Motor/Sensory Function Overall Oral Motor/Sensory Function: Appears within functional limits for tasks assessed Motor Speech Overall Motor Speech: Appears within functional limits for tasks assessed Respiration: Within functional limits Phonation: Normal Resonance: Within functional limits Articulation: Within functional limitis Intelligibility: Intelligible Motor Planning: Witnin functional limits   GO     Royce Macadamia 09/20/2015, 12:18 PM  Breck Coons Tomica Arseneault M.Ed [employer] [phone]

## 2015-09-20 NOTE — Progress Notes (Signed)
[facility] PROGRESS NOTE  Jamie Cochran ZOX:[id number] DOB: Dec 16, 1929 DOA: 09/17/2015 PCP: Bennie PieriniMARTIN,MARY MARGARET, FNP  Assessment/Plan: 1. Acute encephalopathy -CT and MRI w/o acute abnormalities  -most likely due to UTI on top of underlying dementia -will continue treatment for UTI -patient is oriented X 2; unknown baseline and no family during examination -PT recommending HH service  -will follow response   2. Hypertension -stable and well controlled -will continue lasix and carvedilol.  3 Dyslipidemia -Continue tricor  4 Hypothyroidism -Continue synthroid  5 Diabetes mellitus (HCC) -Continue on sliding scale -follow CBG's and adjust hypoglycemic regimen as needed  6 E. Coli UTI (lower urinary tract infection) -still with low grade fever -per sensitivity and culture data will treat with Ceftin  -will follow tolerance and if stable home in am  7 Diastolic dysfunction -Recent ECHO ef 55% -will follow daily weight and strict intake/output -will continue lasix. -low sodium diet   Code Status: Full Family Communication: no family at bedside Disposition Plan: per PT rec's ok to return home with [facility] services and supervision 24/7   Consultants:  None   Procedures:  See below for x-ray reports   Antibiotics:  Rocephin 11/04>>>11/06  Levaquin 11/06>>11/07  Bactrim 11/07  HPI/Subjective: Low grade temp fever, still with intermittent dry coughing spells, no nausea, no vomiting and no abd pain  Objective: Filed Vitals:   09/20/15 1310  BP: 111/53  Pulse: 66  Temp: 99.6 F (37.6 C)  Resp: 20    Intake/Output Summary (Last 24 hours) at 09/20/15 1640 Last data filed at 09/20/15 1300  Gross per 24 hour  Intake    700 ml  Output      0 ml  Net    700 ml   Filed Weights   09/19/15 1100 09/20/15 0053 09/20/15 0132  Weight: 58.6 kg (129 lb 3 oz) 60.419 kg (133 lb 3.2 oz) 60.419 kg (133 lb 3.2 oz)    Exam:   General:  Low grade  temp overnight, complaining of some dry cough, no CP, no SOB. Confuse (but most likely at baseline); able to follow commands appropriately. Very hard of hearing.  Cardiovascular: S1 and S2, positive murmur, no rubs or gallops  Respiratory: positive rhonchi, no wheezing; decrease BS at bases  Abdomen: soft, NT, ND, positive BS  Musculoskeletal: no edema or cyanosis   Data Reviewed: Basic Metabolic Panel:  Recent Labs Lab 09/17/15 2137 09/18/15 0417 09/20/15 0608  NA 142 143 134*  K 3.7 3.2* 3.2*  CL 97* 100* 97*  CO2 33* 33* 27  GLUCOSE 129* 98 158*  BUN 20 19 17   CREATININE 1.23* 1.09* 1.01*  CALCIUM 9.9 9.1 8.3*   Liver Function Tests:  Recent Labs Lab 09/17/15 2137 09/18/15 0417  AST 31 27  ALT 16 15  ALKPHOS 42 37*  BILITOT 0.7 0.6  PROT 6.8 6.4*  ALBUMIN 3.5 3.0*   CBC:  Recent Labs Lab 09/17/15 2137 09/18/15 0417  WBC 9.1 7.5  NEUTROABS 5.0  --   HGB 12.2 11.3*  HCT 37.0 35.2*  MCV 89.6 88.9  PLT 146* 141*    ProBNP (last 3 results) No results for input(s): PROBNP in the last 8760 hours.  CBG: No results for input(s): GLUCAP in the last 168 hours.  Recent Results (from the past 240 hour(s))  Urine culture     Status: None   Collection Time: 09/17/15 10:18 AM  Result Value Ref Range Status   Specimen Description URINE, RANDOM  Final  Special Requests Immunocompromised  Final   Culture >=100,000 COLONIES/mL ESCHERICHIA COLI  Final   Report Status 09/20/2015 FINAL  Final   Organism ID, Bacteria ESCHERICHIA COLI  Final      Susceptibility   Escherichia coli - MIC*    AMPICILLIN >=32 RESISTANT Resistant     CEFAZOLIN <=4 SENSITIVE Sensitive     CEFTRIAXONE <=1 SENSITIVE Sensitive     CIPROFLOXACIN >=4 RESISTANT Resistant     GENTAMICIN >=16 RESISTANT Resistant     IMIPENEM <=0.25 SENSITIVE Sensitive     NITROFURANTOIN <=16 SENSITIVE Sensitive     TRIMETH/SULFA <=20 SENSITIVE Sensitive     AMPICILLIN/SULBACTAM 16 INTERMEDIATE Intermediate      PIP/TAZO <=4 SENSITIVE Sensitive     * >=100,000 COLONIES/mL ESCHERICHIA COLI  Culture, blood (routine x 2)     Status: None (Preliminary result)   Collection Time: 09/18/15 10:10 PM  Result Value Ref Range Status   Specimen Description BLOOD LEFT ARM  Final   Special Requests BOTTLES DRAWN AEROBIC ONLY 7CC  Final   Culture NO GROWTH 2 DAYS  Final   Report Status PENDING  Incomplete  Culture, blood (routine x 2)     Status: None (Preliminary result)   Collection Time: 09/18/15 10:20 PM  Result Value Ref Range Status   Specimen Description BLOOD RIGHT HAND  Final   Special Requests BOTTLES DRAWN AEROBIC AND ANAEROBIC 5CC  Final   Culture NO GROWTH 2 DAYS  Final   Report Status PENDING  Incomplete     Studies: Dg Chest 2 View  09/19/2015  CLINICAL DATA:  Fever. EXAM: CHEST  2 VIEW COMPARISON:  09/17/2015 FINDINGS: Heart size is normal. There is a moderate left pleural effusion which appears partially loculated and is increased in volume from previous exam. Diminished aeration to the left base noted. Stable chronic compression deformity within the lower thoracic or upper lumbar spine. IMPRESSION: 1. Increase in volume of loculated left pleural effusion with worsening aeration to the left base. Electronically Signed   By: Signa Kell M.D.   On: 09/19/2015 08:48    Scheduled Meds: . amLODipine  5 mg Oral Daily  . aspirin  300 mg Rectal Daily   Or  . aspirin  325 mg Oral Daily  . carvedilol  12.5 mg Oral BID WC  . cefUROXime  500 mg Oral BID WC  . citalopram  20 mg Oral Daily  . darifenacin  7.5 mg Oral Daily  . donepezil  5 mg Oral QHS  . fenofibrate  160 mg Oral Daily  . furosemide  20 mg Oral Daily  . heparin  5,000 Units Subcutaneous 3 times per day  . levothyroxine  75 mcg Oral QAC breakfast  . lisinopril  20 mg Oral Daily  . pantoprazole  40 mg Oral Daily  . potassium chloride  20 mEq Oral Daily   Continuous Infusions:   Principal Problem:   Acute  encephalopathy Active Problems:   Hypertension   Hyperlipidemia   Hypothyroidism   Diabetes mellitus (HCC)   UTI (lower urinary tract infection)   Diastolic dysfunction    Time spent: 30 minutes    Vassie Loll  [facility] Pager [phone]. If 7PM-7AM, please contact night-coverage at [email or web address], password [facility] 09/20/2015, 4:40 PM  LOS: 0 days

## 2015-09-21 ENCOUNTER — Encounter (HOSPITAL_COMMUNITY): Payer: Self-pay | Admitting: General Practice

## 2015-09-21 DIAGNOSIS — R05 Cough: Secondary | ICD-10-CM

## 2015-09-21 DIAGNOSIS — R059 Cough, unspecified: Secondary | ICD-10-CM | POA: Insufficient documentation

## 2015-09-21 DIAGNOSIS — I5022 Chronic systolic (congestive) heart failure: Secondary | ICD-10-CM | POA: Insufficient documentation

## 2015-09-21 LAB — BASIC METABOLIC PANEL
Anion gap: 10 (ref 5–15)
BUN: 13 mg/dL (ref 6–20)
CALCIUM: 8.4 mg/dL — AB (ref 8.9–10.3)
CHLORIDE: 99 mmol/L — AB (ref 101–111)
CO2: 27 mmol/L (ref 22–32)
CREATININE: 0.94 mg/dL (ref 0.44–1.00)
GFR, EST NON AFRICAN AMERICAN: 55 mL/min — AB (ref >60)
Glucose, Bld: 159 mg/dL — ABNORMAL HIGH (ref 65–99)
Potassium: 3.3 mmol/L — ABNORMAL LOW (ref 3.5–5.1)
SODIUM: 136 mmol/L (ref 135–145)

## 2015-09-21 MED ORDER — FUROSEMIDE 40 MG PO TABS: 20.0000 mg | ORAL_TABLET | Freq: Every day | ORAL | Status: DC

## 2015-09-21 MED ORDER — CEFUROXIME AXETIL 500 MG PO TABS: 500.0000 mg | ORAL_TABLET | Freq: Two times a day (BID) | ORAL | Status: DC

## 2015-09-21 MED ORDER — DONEPEZIL HCL 5 MG PO TABS: 5.0000 mg | ORAL_TABLET | Freq: Every day | ORAL | Status: DC

## 2015-09-21 MED ORDER — LORATADINE-PSEUDOEPHEDRINE ER 10-240 MG PO TB24: 1.0000 | ORAL_TABLET | Freq: Every day | ORAL | Status: DC

## 2015-09-21 MED ORDER — GUAIFENESIN-DM 100-10 MG/5ML PO SYRP: 5.0000 mL | ORAL_SOLUTION | ORAL | Status: DC | PRN

## 2015-09-21 NOTE — Care Management Note (Addendum)
Case Management Note  Patient Details  Name: Jamie Cochran MRN: [id number] Date of Birth: 06/14/1930  Subjective/Objective:                 Patient admitted from home for acute confusion. Per nursing report patient lives with son/ family/ grandchildren. [facility]    Action/Plan:  [facility]   Expected Discharge Date:                  Expected Discharge Plan:  Home w Home Health Services  In-House Referral:     Discharge planning Services  CM Consult  Post Acute Care Choice:    Choice offered to:     DME Arranged:    DME Agency:     HH Arranged:    HH Agency:     Status of Service:  In process, will continue to follow  Medicare Important Message Given:    Date Medicare IM Given:    Medicare IM give by:    Date Additional Medicare IM Given:    Additional Medicare Important Message give by:     If discussed at Long Length of Stay Meetings, dates discussed:    Additional Comments:  Jamie SabalDebbie Duston Smolenski, RN 09/21/2015, 11:29 AM

## 2015-09-21 NOTE — Progress Notes (Signed)
Spoke with grand daughter Marchelle Folksmanda via phone who stated she is declining EMS transport and will be picking her up from the hospital and transporting the patient herself. She verbalized understanding of the risks of not using EMS transport for the patient.

## 2015-09-21 NOTE — Discharge Summary (Signed)
Physician Discharge Summary  Jamie Cochran:[id number] DOB: 1930-04-05 DOA: 09/17/2015  PCP: Bennie Pierini, FNP  Admit date: 09/17/2015 Discharge date: 09/21/2015  Time spent: 40 minutes  Recommendations for Outpatient Follow-up:  1. Repeat BMET to follow electrolytes and renal function 2. Reassess BP and adjust medications as needed  Discharge Diagnoses:  Principal Problem:   Acute encephalopathy Active Problems:   Hypertension   Hyperlipidemia   Hypothyroidism   Diabetes mellitus (HCC)   UTI (lower urinary tract infection)   Diastolic dysfunction prior hx of CVA  Discharge Condition: stable and improved. Discharge home with home health services and instructions to follow with PCP in 10 days  Diet recommendation: heart healthy and modified carbohydrates   Filed Weights   09/19/15 1100 09/20/15 0053 09/20/15 0132  Weight: 58.6 kg (129 lb 3 oz) 60.419 kg (133 lb 3.2 oz) 60.419 kg (133 lb 3.2 oz)    History of present illness:  81 y.o. female with Past medical history of diabetes mellitus, coronary artery disease, hypertension, CVA, dyslipidemia, hypothyroidism, chronic diastolic dysfunction. Who presented to ED with acute AMS, fever and dry cough. Workup demonstrated UTI and patient was admitted for further evaluation and treatment.  Hospital Course:  1. Acute encephalopathy -CT and MRI w/o acute abnormalities  -most likely due to UTI on top of underlying dementia -will continue treatment for UTI -patient is oriented X 2; unknown baseline and no family during examination; but with hx of underlying dementia and aricept. Able to follow commands and appropriately answering questions  -PT recommending [facility] service, which will arrange at discharge   2. Hypertension -stable and well controlled -will continue home antihypertensive regimen -advise to follow low sodium diet.  3 Dyslipidemia -Continue tricor  4 Hypothyroidism -Continue synthroid  5  Diabetes mellitus (HCC) -Continue home hypoglycemic regimen  6 E. Coli UTI (lower urinary tract infection) -now w/o fever for 24 hours; no nausea, no vomiting -will discharge home on ceftin to complete antibiotic therapy -advise to maintain adequate hydration  7 Chronic Diastolic dysfunction -Recent ECHO with ef 55% -advise to follow her weight on daily basis and to follow low sodium diet -will continue lasix and PRN metolazone   8-hx of CVA -will continue aspirin for secondary prevention  Procedures:  See below for x-ray reports   Consultations:  None   Discharge Exam: Filed Vitals:   09/21/15 0558  BP: 141/54  Pulse: 73  Temp: 98.5 F (36.9 C)  Resp: 20    General: no fever, complaining of some intermittent dry cough and runny nose; denies CP and SOB. Confuse, but oriented X 2 (but most likely at baseline as patient have dementia); able to follow commands appropriately, Very hard of hearing.  Cardiovascular: S1 and S2, positive murmur, no rubs or gallops  Respiratory: positive rhonchi, no wheezing; no use of accessory muscles; good air movement   Abdomen: soft, NT, ND, positive BS  Musculoskeletal: no edema or cyanosis   Discharge Instructions   Discharge Instructions    Diet - low sodium heart healthy    Complete by:  As directed      Discharge instructions    Complete by:  As directed   Maintain adequate hydration No smoking and no second hand smoking Follow up with PCP in 10 days Take medications as prescribed Follow heart healthy diet          Current Discharge Medication List    START taking these medications   Details  cefUROXime (CEFTIN) 500 MG tablet  Take 1 tablet (500 mg total) by mouth 2 (two) times daily with a meal. Qty: 14 tablet, Refills: 0    guaiFENesin-dextromethorphan (ROBITUSSIN DM) 100-10 MG/5ML syrup Take 5 mLs by mouth every 4 (four) hours as needed for cough. Qty: 118 mL, Refills: 0    loratadine-pseudoephedrine  (CLARITIN-D 24 HOUR) 10-240 MG 24 hr tablet Take 1 tablet by mouth daily. Qty: 30 tablet, Refills: 0      CONTINUE these medications which have CHANGED   Details  donepezil (ARICEPT) 5 MG tablet Take 1 tablet (5 mg total) by mouth at bedtime. Qty: 30 tablet, Refills: 0    furosemide (LASIX) 40 MG tablet Take 0.5 tablets (20 mg total) by mouth daily.   Associated Diagnoses: Chronic systolic congestive heart failure (HCC)      CONTINUE these medications which have NOT CHANGED   Details  amLODipine (NORVASC) 5 MG tablet TAKE ONE TABLET BY MOUTH ONCE DAILY MUST  BE  SEEN  FOR  ADDITIONAL  REFILLS Qty: 30 tablet, Refills: 5   Associated Diagnoses: Essential hypertension    carvedilol (COREG) 25 MG tablet TAKE ONE TABLET BY MOUTH TWICE DAILY WITH MEALS MUST  BE  SEEN  FOR  ADDITIONAL  REFILLS Qty: 60 tablet, Refills: 5   Associated Diagnoses: Essential hypertension    citalopram (CELEXA) 20 MG tablet TAKE ONE TABLET BY MOUTH ONCE DAILY MUST  BE  SEEN  FOR  ADDITIONAL  REFILLS Qty: 30 tablet, Refills: 5   Associated Diagnoses: Depression    fenofibrate (TRICOR) 145 MG tablet Take 1 tablet (145 mg total) by mouth daily. Qty: 30 tablet, Refills: 5    insulin glargine (LANTUS) 100 UNIT/ML injection INJECT 24 UNITS SUBCUTANEOUSLY AT BEDTIME Qty: 10 vial, Refills: 5   Associated Diagnoses: Type 1 diabetes mellitus without complication (HCC)    levothyroxine (SYNTHROID, LEVOTHROID) 75 MCG tablet Take 1 tablet (75 mcg total) by mouth daily. Qty: 30 tablet, Refills: 5   Associated Diagnoses: Hypothyroidism, unspecified hypothyroidism type    lisinopril (PRINIVIL,ZESTRIL) 20 MG tablet Take 1 tablet (20 mg total) by mouth daily. Qty: 30 tablet, Refills: 5   Associated Diagnoses: Essential hypertension    omeprazole (PRILOSEC) 40 MG capsule Take 1 capsule (40 mg total) by mouth daily. Qty: 30 capsule, Refills: 5   Associated Diagnoses: Gastroesophageal reflux disease without esophagitis     potassium chloride (K-DUR) 10 MEQ tablet Take 2 tablets (20 mEq total) by mouth daily. Qty: 60 tablet, Refills: 5   Associated Diagnoses: Hypokalemia    solifenacin (VESICARE) 10 MG tablet Take 1 tablet (10 mg total) by mouth daily. Qty: 30 tablet, Refills: 5   Associated Diagnoses: Stress incontinence    aspirin 81 MG tablet Take 81 mg by mouth daily.    insulin aspart (NOVOLOG) 100 UNIT/ML injection Inject 0-15 Units into the skin 3 (three) times daily with meals. Qty: 1 vial    Saxagliptin-Metformin (KOMBIGLYZE XR) 2.03-999 MG TB24 Take 1 tablet by mouth 2 (two) times daily. Qty: 60 tablet, Refills: 5   Associated Diagnoses: Type 1 diabetes mellitus without complication (HCC)      STOP taking these medications     Cinnamon 500 MG capsule      metolazone (ZAROXOLYN) 2.5 MG tablet        Allergies  Allergen Reactions  . Sulfa Antibiotics Rash   Follow-up Information    Follow up with Bennie Pierini, FNP. Schedule an appointment as soon as possible for a visit in 10 days.  Specialty:  Family Medicine   Contact information:   [address] [place] [address] [phone]       The results of significant diagnostics from this hospitalization (including imaging, microbiology, ancillary and laboratory) are listed below for reference.    Significant Diagnostic Studies: Dg Chest 2 View  09/19/2015  CLINICAL DATA:  Fever. EXAM: CHEST  2 VIEW COMPARISON:  09/17/2015 FINDINGS: Heart size is normal. There is a moderate left pleural effusion which appears partially loculated and is increased in volume from previous exam. Diminished aeration to the left base noted. Stable chronic compression deformity within the lower thoracic or upper lumbar spine. IMPRESSION: 1. Increase in volume of loculated left pleural effusion with worsening aeration to the left base. Electronically Signed   By: Signa Kellaylor  Stroud M.D.   On: 09/19/2015 08:48   Dg Chest 2 View  09/17/2015   CLINICAL DATA:  Altered mental status.  Cough and chest congestion. EXAM: CHEST  2 VIEW COMPARISON:  01/05/2015 FINDINGS: There is unchanged moderate cardiomegaly. The lungs are clear. There is no pleural effusion. Mild chronic appearing central vascular prominence. IMPRESSION: Cardiomegaly and mild chronic appearing central vascular prominence. No acute findings. Electronically Signed   By: Ellery Plunkaniel R Mitchell M.D.   On: 09/17/2015 21:07   Ct Head Wo Contrast  09/17/2015  CLINICAL DATA:  Altered mental status. EXAM: CT HEAD WITHOUT CONTRAST TECHNIQUE: Contiguous axial images were obtained from the base of the skull through the vertex without intravenous contrast. COMPARISON:  CT, 01/05/2015.  MRI, 01/06/2015. FINDINGS: Ventricles are normal configuration. There is ventricular enlargement greater than sulcal enlargement, which is stable likely due to a predominance of central volume loss. No convincing hydrocephalus. There are no parenchymal masses or mass effect. There is an old deep white matter lacune infarct in the right parietal lobe. Mild periventricular white matter hypoattenuation is noted consistent with chronic microvascular ischemic change. There is no evidence of a recent cortical infarct. There are no extra-axial masses or abnormal fluid collections. There is no intracranial hemorrhage. Mild mucosal thickening lines the maxillary sinuses with moderate ethmoid air cell mucosal thickening. Mild inferior frontal and sphenoid sinus mucosal thickening. There changes from right mastoidectomy. Soft tissue fills the right middle ear cavity. There is some soft tissue along the posterior left middle ear cavity. The soft tissue in the left middle ear cavity is new from the prior CT. Changes on the right are stable. IMPRESSION: 1. No acute intracranial abnormalities. 2. Atrophy, old deep white matter right parietal lobe infarct and mild chronic microvascular ischemic change 3. Sinus mucosal thickening as  described. Chronic soft tissue opacification of right middle ear cavity. New abnormal soft tissue in the left middle ear cavity. There is no associated bone resorption. Consider followup temporal bone CT on a nonemergent basis if the patient has hearing loss or left temporal/ear pain. Electronically Signed   By: Amie Portlandavid  Ormond M.D.   On: 09/17/2015 22:14   Mr Brain Wo Contrast  09/18/2015  CLINICAL DATA:  Confusion and slurred speech. History of diabetes, hypertension, high dyslipidemia, stroke. EXAM: MRI HEAD WITHOUT CONTRAST TECHNIQUE: Multiplanar, multiecho pulse sequences of the brain and surrounding structures were obtained without intravenous contrast. COMPARISON:  CT head September 17, 2015 and MRI of the head January 06, 2015 FINDINGS: Moderate to severe ventriculomegaly on the basis of global parenchymal brain volume loss, stable from prior imaging. Old RIGHT thalamus lacunar infarct. Patchy supratentorial white matter T2 hyperintensities. No midline shift, mass effect or mass lesions.  No abnormal extra-axial fluid collections. Normal major intracranial vascular flow voids observed at the skull base. Moderate paranasal sinus mucosal thickening without air-fluid levels. Small LEFT mastoid effusion. Status post wall down RIGHT mastoidectomy with soft tissue in the surgical bed, unchanged. No abnormal sellar expansion. No cerebellar tonsillar ectopia. Generalized decreased T1 bone marrow signal, nonspecific and, unchanged. Patient is edentulous. IMPRESSION: No acute intracranial process, specifically no acute ischemia. Stable moderate to severe global brain atrophy. Moderate chronic small vessel ischemic disease and old LEFT thalamus lacunar infarct. Electronically Signed   By: Awilda Metro M.D.   On: 09/18/2015 05:31    Microbiology: Recent Results (from the past 240 hour(s))  Urine culture     Status: None   Collection Time: 09/17/15 10:18 AM  Result Value Ref Range Status   Specimen  Description URINE, RANDOM  Final   Special Requests Immunocompromised  Final   Culture >=100,000 COLONIES/mL ESCHERICHIA COLI  Final   Report Status 09/20/2015 FINAL  Final   Organism ID, Bacteria ESCHERICHIA COLI  Final      Susceptibility   Escherichia coli - MIC*    AMPICILLIN >=32 RESISTANT Resistant     CEFAZOLIN <=4 SENSITIVE Sensitive     CEFTRIAXONE <=1 SENSITIVE Sensitive     CIPROFLOXACIN >=4 RESISTANT Resistant     GENTAMICIN >=16 RESISTANT Resistant     IMIPENEM <=0.25 SENSITIVE Sensitive     NITROFURANTOIN <=16 SENSITIVE Sensitive     TRIMETH/SULFA <=20 SENSITIVE Sensitive     AMPICILLIN/SULBACTAM 16 INTERMEDIATE Intermediate     PIP/TAZO <=4 SENSITIVE Sensitive     * >=100,000 COLONIES/mL ESCHERICHIA COLI  Culture, blood (routine x 2)     Status: None (Preliminary result)   Collection Time: 09/18/15 10:10 PM  Result Value Ref Range Status   Specimen Description BLOOD LEFT ARM  Final   Special Requests BOTTLES DRAWN AEROBIC ONLY 7CC  Final   Culture NO GROWTH 3 DAYS  Final   Report Status PENDING  Incomplete  Culture, blood (routine x 2)     Status: None (Preliminary result)   Collection Time: 09/18/15 10:20 PM  Result Value Ref Range Status   Specimen Description BLOOD RIGHT HAND  Final   Special Requests BOTTLES DRAWN AEROBIC AND ANAEROBIC 5CC  Final   Culture NO GROWTH 3 DAYS  Final   Report Status PENDING  Incomplete     Labs: Basic Metabolic Panel:  Recent Labs Lab 09/17/15 2137 09/18/15 0417 09/20/15 0608 09/21/15 0451  NA 142 143 134* 136  K 3.7 3.2* 3.2* 3.3*  CL 97* 100* 97* 99*  CO2 33* 33* 27 27  GLUCOSE 129* 98 158* 159*  BUN CREATININE 1.23* 1.09* 1.01* 0.94  CALCIUM 9.9 9.1 8.3* 8.4*   Liver Function Tests:  Recent Labs Lab 09/17/15 2137 09/18/15 0417  AST 31 27  ALT 16 15  ALKPHOS 42 37*  BILITOT 0.7 0.6  PROT 6.8 6.4*  ALBUMIN 3.5 3.0*   CBC:  Recent Labs Lab 09/17/15 2137 09/18/15 0417  WBC 9.1 7.5   NEUTROABS 5.0  --   HGB 12.2 11.3*  HCT 37.0 35.2*  MCV 89.6 88.9  PLT 146* 141*    Signed:  Vassie Loll  [facility] 09/21/2015, 1:21 PM

## 2015-09-21 NOTE — Progress Notes (Signed)
NURSING PROGRESS NOTE  Jamie Cochran [id number] Discharge Data: 09/21/2015 5:57 PM Attending Provider: No att. providers found WUJ:[id number],NWGNPCP:MARTIN,MARY Claris CheMARGARET, FNP   Jamie PeersPhyllis D Cochran to be D/C'd Home per MD order with grand daughter via wheelchair by NA Tayla, all prescriptions sent to patient preferred pharmacy. [facility] and aware of last set of vital signs and is okay with discharge. Patient grand daughter declined EMS transport despite reeducation.     All IV's will be discontinued and monitored for bleeding.  All belongings will be returned to patient for patient to take home.  Last Documented Vital Signs:  Blood pressure 133/82, pulse 71, temperature 98.4 F (36.9 C), temperature source Oral, resp. rate 16, height 4\' 8"  (1.422 m), weight 60.419 kg (133 lb 3.2 oz), last menstrual period 02/04/1963, SpO2 89 %.  Leane PlattSpencer Fines Kimberlin RN, BS, BSN

## 2015-09-21 NOTE — Progress Notes (Signed)
Pharmacist Provided - Patient Medication Education Prior to Discharge   Jamie Cochran is an 81 y.o. female [facility] with a chief complaint of  Chief Complaint  Patient presents with  . Cough     [x]  Patient will be discharged with new medications []  Patient being discharged without any new medications  The following medications were discussed with the patient:  Pain Control medications: []  Yes    []  No  Diabetes Medications: []  Yes    []  No  Heart Failure Medications: []  Yes    []  No  Anticoagulation Medications:  []  Yes    []  No  Antibiotics at discharge: [x]  Yes    []  No  Allergy Assessment Completed and Updated: []  Yes    [x]  No Identified Patient Allergies:  Allergies  Allergen Reactions  . Sulfa Antibiotics Rash     Medication Adherence Assessment: Unable to assess      []  Excellent (no doses missed/week)      []  Good (1 dose missed/week)      []  Partial (2-3 doses missed/week)      []  Poor (>3 doses missed/week)  Barriers to Obtaining Medications: []  Yes [x]  No   Assessment: 81 y/o F with AMS and UTI. Pt was very sleepy and was not responsive to counseling. Spoke with patient's granddaughter who helps with medications. Counseled on discharge antibiotics.  Time spent preparing for discharge counseling: 15 min Time spent counseling patient: 15 min  Jamie Cochran, PharmD Pharmacy Resident Pager: [phone] 09/21/2015, 4:58 PM

## 2015-09-23 ENCOUNTER — Telehealth: Payer: Self-pay | Admitting: Nurse Practitioner

## 2015-09-23 LAB — CULTURE, BLOOD (ROUTINE X 2)
Culture: NO GROWTH
Culture: NO GROWTH

## 2015-09-23 NOTE — Telephone Encounter (Signed)
Pt given appt with MMM 11/15.

## 2015-09-23 NOTE — Telephone Encounter (Signed)
Spoke to home health nurse and he was just calling to let us know pt had "rolled out of bed" per caregiver but pt is ok. FYI. MMM is aware.

## 2015-09-28 ENCOUNTER — Encounter: Payer: Self-pay | Admitting: Nurse Practitioner

## 2015-09-28 ENCOUNTER — Ambulatory Visit (INDEPENDENT_AMBULATORY_CARE_PROVIDER_SITE_OTHER): Payer: Medicare Other | Admitting: Nurse Practitioner

## 2015-09-28 VITALS — BP 136/69 | HR 66 | Temp 97.0°F | Ht <= 58 in | Wt 134.8 lb

## 2015-09-28 DIAGNOSIS — F0391 Unspecified dementia with behavioral disturbance: Secondary | ICD-10-CM | POA: Diagnosis not present

## 2015-09-28 DIAGNOSIS — Z09 Encounter for follow-up examination after completed treatment for conditions other than malignant neoplasm: Secondary | ICD-10-CM | POA: Diagnosis not present

## 2015-09-28 DIAGNOSIS — N3000 Acute cystitis without hematuria: Secondary | ICD-10-CM | POA: Diagnosis not present

## 2015-09-28 DIAGNOSIS — R05 Cough: Secondary | ICD-10-CM | POA: Diagnosis not present

## 2015-09-28 DIAGNOSIS — R059 Cough, unspecified: Secondary | ICD-10-CM

## 2015-09-28 MED ORDER — MEMANTINE HCL 10 MG PO TABS: 10.0000 mg | ORAL_TABLET | Freq: Two times a day (BID) | ORAL | Status: DC

## 2015-09-28 NOTE — Progress Notes (Signed)
   Subjective:    Patient ID: Jamie Cochran, female    DOB: 24-Dec-1929, 81 y.o.   MRN: [id number]  HPI Patient here today for hospital follow up- she was in the hospital for a bad UTI and was given IV antibiotics. Sh ewas discharged home on antibiotics- They have several complaints today: - cough- has had while was in the hospital- she has been taking robitussin OTC and has not helped. -memory problems- has been on aricept and grand daughter thinks she is worse. She is sleep walking at night- having strange dreams.    Review of Systems  Constitutional: Negative.   HENT: Negative.   Respiratory: Negative.   Cardiovascular: Negative.   Genitourinary: Negative.   Neurological: Negative.   Psychiatric/Behavioral: Negative.   All other systems reviewed and are negative.      Objective:   Physical Exam  Constitutional: She appears well-developed and well-nourished.  Cardiovascular: Normal rate, regular rhythm and normal heart sounds.   Pulmonary/Chest: Effort normal.  Neurological: She is alert.  Skin: Skin is warm and dry.  Psychiatric: She has a normal mood and affect. Her behavior is normal. Judgment and thought content normal.    BP 136/69 mmHg  Pulse 66  Temp(Src) 97 F (36.1 C) (Oral)  Ht 4\' 8"  (1.422 m)  Wt 134 lb 12.8 oz (61.145 kg)  BMI 30.24 kg/m2  LMP 02/04/1963       Assessment & Plan:  1. Hospital discharge follow-up hospital records reviewed  2. Cough Delsym OTC  3. Dementia, with behavioral disturbance Added namenda and stopped aricept-   4. Acute cystitis without hematuria Finish antibiotic Force fluids  Jamie Daphine DeutscherMartin, FNP

## 2015-09-28 NOTE — Patient Instructions (Signed)

## 2015-10-04 ENCOUNTER — Ambulatory Visit: Payer: Medicare Other | Admitting: Nurse Practitioner

## 2015-10-13 ENCOUNTER — Other Ambulatory Visit: Payer: Medicare Other

## 2015-10-13 DIAGNOSIS — R300 Dysuria: Secondary | ICD-10-CM

## 2015-10-15 LAB — URINE CULTURE

## 2015-10-18 ENCOUNTER — Telehealth: Payer: Self-pay | Admitting: Nurse Practitioner

## 2015-10-18 ENCOUNTER — Other Ambulatory Visit: Payer: Self-pay | Admitting: Nurse Practitioner

## 2015-10-18 MED ORDER — DOXYCYCLINE HYCLATE 100 MG PO TABS: 100.0000 mg | ORAL_TABLET | Freq: Two times a day (BID) | ORAL | Status: DC

## 2015-10-18 NOTE — Telephone Encounter (Signed)
Spoke with granddaughter and gave results of urine culture Also notified of RX Verbalizes understanding

## 2015-11-03 ENCOUNTER — Telehealth: Payer: Self-pay | Admitting: Nurse Practitioner

## 2015-11-03 DIAGNOSIS — N390 Urinary tract infection, site not specified: Secondary | ICD-10-CM

## 2015-11-03 NOTE — Telephone Encounter (Signed)
Pt finished antibiotic and want culture to be sure infection is gone [facility] in [employer]

## 2015-11-13 ENCOUNTER — Encounter (INDEPENDENT_AMBULATORY_CARE_PROVIDER_SITE_OTHER): Payer: Medicare Other | Admitting: Family Medicine

## 2015-11-13 DIAGNOSIS — N390 Urinary tract infection, site not specified: Secondary | ICD-10-CM

## 2015-11-13 DIAGNOSIS — I10 Essential (primary) hypertension: Secondary | ICD-10-CM

## 2015-11-13 DIAGNOSIS — B9620 Unspecified Escherichia coli [E. coli] as the cause of diseases classified elsewhere: Secondary | ICD-10-CM

## 2015-11-13 DIAGNOSIS — I519 Heart disease, unspecified: Secondary | ICD-10-CM

## 2015-12-09 ENCOUNTER — Ambulatory Visit: Payer: Medicare Other | Admitting: Nurse Practitioner

## 2015-12-22 ENCOUNTER — Telehealth: Payer: Self-pay | Admitting: Nurse Practitioner

## 2015-12-22 NOTE — Telephone Encounter (Signed)
Patient advised Jamie Cochran does not have any appointments tomorrow and appointment offered for 9:25 with [facility]. Appointment scheduled.

## 2015-12-23 ENCOUNTER — Encounter: Payer: Self-pay | Admitting: Family Medicine

## 2015-12-23 ENCOUNTER — Ambulatory Visit (INDEPENDENT_AMBULATORY_CARE_PROVIDER_SITE_OTHER): Payer: Medicare Other | Admitting: Family Medicine

## 2015-12-23 VITALS — BP 127/68 | HR 72 | Temp 98.5°F | Ht <= 58 in | Wt 130.6 lb

## 2015-12-23 DIAGNOSIS — R350 Frequency of micturition: Secondary | ICD-10-CM

## 2015-12-23 DIAGNOSIS — N3000 Acute cystitis without hematuria: Secondary | ICD-10-CM | POA: Diagnosis not present

## 2015-12-23 DIAGNOSIS — J019 Acute sinusitis, unspecified: Secondary | ICD-10-CM | POA: Diagnosis not present

## 2015-12-23 LAB — POCT URINALYSIS DIPSTICK
GLUCOSE UA: NEGATIVE
NITRITE UA: POSITIVE
PH UA: 6
Protein, UA: NEGATIVE
Spec Grav, UA: 1.02
UROBILINOGEN UA: NEGATIVE

## 2015-12-23 LAB — POCT UA - MICROSCOPIC ONLY
CASTS, UR, LPF, POC: NEGATIVE
Crystals, Ur, HPF, POC: NEGATIVE
Mucus, UA: NEGATIVE
YEAST UA: NEGATIVE

## 2015-12-23 MED ORDER — FLUTICASONE PROPIONATE 50 MCG/ACT NA SUSP: 1.0000 | Freq: Two times a day (BID) | NASAL | Status: DC | PRN

## 2015-12-23 MED ORDER — CEFTRIAXONE SODIUM 1 G IJ SOLR
1.0000 g | INTRAMUSCULAR | Status: AC
  Administered 2015-12-23: 1 g via INTRAMUSCULAR

## 2015-12-23 NOTE — Progress Notes (Signed)
BP 127/68 mmHg  Pulse 72  Temp(Src) 98.5 F (36.9 C) (Oral)  Ht  (1.422 m)  Wt 130 lb 9.6 oz (59.24 kg)  BMI 29.30 kg/m2  LMP 02/04/1963   Subjective:    Patient ID: Jamie Cochran, female    DOB: [birth year], 82 y.o.   MRN: [id number]  HPI: Jamie Cochran is a 82 y.o. female presenting on 12/23/2015 for Sinusitis; Diarrhea; and Urinary Frequency   HPI Sinus congestion and nasal congestion Patient has been having sinus congestion and nasal congestion for the past 2-3 weeks. She gets this intermittently with her allergies. She has been taking Robitussin and Delsym and they have not been helping much. She denies any fevers or chills or shortness of breath or wheezing. She denies any sick contacts that she knows of. She has never been a smoker and is not around smokers currently.  Diarrhea Patient has had 3 days worth of loose stools about 3-4 times a day. There is no blood associated with the diarrhea. There have been other people in the family who have had stomach illnesses and they attribute that she may have gotten this from them. She denies any nausea or vomiting. She is staying hydrated sufficiently and has good urinary output.  Urinary frequency. Patient has been having urinary frequency over the past couple days. She does get recurrent UTIs and had one most recently in November that came back positive for Escherichia coli that was resistant to the antibiotics. She has not been able to give a urine yet today. She is drinking some fluids and will try and give a urine. She does have some dysuria associated with it but denies any hematuria or abdominal pain or flank pain. She denies any fevers or chills.  Relevant past medical, surgical, family and social history reviewed and updated as indicated. Interim medical history since our last visit reviewed. Allergies and medications reviewed and updated.  Review of Systems  Constitutional: Negative for fever and chills.    HENT: Positive for postnasal drip, rhinorrhea, sinus pressure, sneezing and sore throat. Negative for congestion, ear discharge and ear pain.   Eyes: Negative for pain, redness and visual disturbance.  Respiratory: Positive for cough. Negative for chest tightness and shortness of breath.   Cardiovascular: Negative for chest pain and leg swelling.  Gastrointestinal: Positive for diarrhea. Negative for nausea, vomiting, abdominal pain, constipation and blood in stool.  Genitourinary: Positive for dysuria, urgency and frequency. Negative for hematuria, flank pain, decreased urine volume and difficulty urinating.  Musculoskeletal: Negative for back pain and gait problem.  Skin: Negative for rash.  Neurological: Negative for light-headedness and headaches.  Psychiatric/Behavioral: Negative for behavioral problems and agitation.  All other systems reviewed and are negative.   Per HPI unless specifically indicated above     Medication List       This list is accurate as of: 12/23/15 10:23 AM.  Always use your most recent med list.               amLODipine 5 MG tablet  Commonly known as:  NORVASC  TAKE ONE TABLET BY MOUTH ONCE DAILY MUST  BE  SEEN  FOR  ADDITIONAL  REFILLS     aspirin 81 MG tablet  Take 81 mg by mouth daily.     carvedilol 25 MG tablet  Commonly known as:  COREG  TAKE ONE TABLET BY MOUTH TWICE DAILY WITH MEALS MUST  BE  SEEN  FOR  ADDITIONAL  REFILLS  cefUROXime 500 MG tablet  Commonly known as:  CEFTIN  Take 1 tablet (500 mg total) by mouth 2 (two) times daily with a meal.     citalopram 20 MG tablet  Commonly known as:  CELEXA  TAKE ONE TABLET BY MOUTH ONCE DAILY MUST  BE  SEEN  FOR  ADDITIONAL  REFILLS     fenofibrate 145 MG tablet  Commonly known as:  TRICOR  Take 1 tablet (145 mg total) by mouth daily.     fluticasone 50 MCG/ACT nasal spray  Commonly known as:  FLONASE  Place 1 spray into both nostrils 2 (two) times daily as needed for allergies or  rhinitis.     furosemide 40 MG tablet  Commonly known as:  LASIX  Take 0.5 tablets (20 mg total) by mouth daily.     guaiFENesin-dextromethorphan 100-10 MG/5ML syrup  Commonly known as:  ROBITUSSIN DM  Take 5 mLs by mouth every 4 (four) hours as needed for cough.     insulin aspart 100 UNIT/ML injection  Commonly known as:  novoLOG  Inject 0-15 Units into the skin 3 (three) times daily with meals.     insulin glargine 100 UNIT/ML injection  Commonly known as:  LANTUS  INJECT 24 UNITS SUBCUTANEOUSLY AT BEDTIME     levothyroxine 75 MCG tablet  Commonly known as:  SYNTHROID, LEVOTHROID  Take 1 tablet (75 mcg total) by mouth daily.     lisinopril 20 MG tablet  Commonly known as:  PRINIVIL,ZESTRIL  Take 1 tablet (20 mg total) by mouth daily.     loratadine-pseudoephedrine 10-240 MG 24 hr tablet  Commonly known as:  CLARITIN-D 24 HOUR  Take 1 tablet by mouth daily.     memantine 10 MG tablet  Commonly known as:  NAMENDA  Take 1 tablet (10 mg total) by mouth 2 (two) times daily.     omeprazole 40 MG capsule  Commonly known as:  PRILOSEC  Take 1 capsule (40 mg total) by mouth daily.     potassium chloride 10 MEQ tablet  Commonly known as:  K-DUR  Take 2 tablets (20 mEq total) by mouth daily.     Saxagliptin-Metformin 2.03-999 MG Tb24  Commonly known as:  KOMBIGLYZE XR  Take 1 tablet by mouth 2 (two) times daily.     solifenacin 10 MG tablet  Commonly known as:  VESICARE  Take 1 tablet (10 mg total) by mouth daily.           Objective:    BP 127/68 mmHg  Pulse 72  Temp(Src) 98.5 F (36.9 C) (Oral)  Ht 4\' 8"  (1.422 m)  Wt 130 lb 9.6 oz (59.24 kg)  BMI 29.30 kg/m2  LMP 02/04/1963  Wt Readings from Last 3 Encounters:  12/23/15 130 lb 9.6 oz (59.24 kg)  09/28/15 134 lb 12.8 oz (61.145 kg)  09/20/15 133 lb 3.2 oz (60.419 kg)    Physical Exam  Constitutional: She is oriented to person, place, and time. She appears well-developed and well-nourished. No distress.    HENT:  Right Ear: Tympanic membrane, external ear and ear canal normal.  Left Ear: Tympanic membrane, external ear and ear canal normal.  Nose: Mucosal edema and rhinorrhea present. No epistaxis. Right sinus exhibits no maxillary sinus tenderness and no frontal sinus tenderness. Left sinus exhibits no maxillary sinus tenderness and no frontal sinus tenderness.  Mouth/Throat: Uvula is midline and mucous membranes are normal. Posterior oropharyngeal edema and posterior oropharyngeal erythema present. No oropharyngeal exudate or  tonsillar abscesses.  Eyes: Conjunctivae and EOM are normal.  Neck: Neck supple. No thyromegaly present.  Cardiovascular: Normal rate, regular rhythm, normal heart sounds and intact distal pulses.   No murmur heard. Pulmonary/Chest: Effort normal and breath sounds normal. No respiratory distress. She has no wheezes.  Abdominal: Soft. Bowel sounds are normal. She exhibits no distension. There is no tenderness. There is no rebound.  Musculoskeletal: Normal range of motion. She exhibits no edema or tenderness.  Lymphadenopathy:    She has no cervical adenopathy.  Neurological: She is alert and oriented to person, place, and time. Coordination normal.  Skin: Skin is warm and dry. No rash noted. She is not diaphoretic.  Psychiatric: She has a normal mood and affect. Her behavior is normal.  Vitals reviewed.   Results for orders placed or performed in visit on 10/13/15  Urine culture  Result Value Ref Range   Urine Culture, Routine Final report (A)    Urine Culture result 1 Comment (A)    ANTIMICROBIAL SUSCEPTIBILITY Comment       Assessment & Plan:   Problem List Items Addressed This Visit    None    Visit Diagnoses    Urinary frequency    -  Primary    Relevant Medications    cefTRIAXone (ROCEPHIN) injection 1 g (Start on 12/23/2015 11:15 AM)    Other Relevant Orders    POCT UA - Microscopic Only (Completed)    POCT urinalysis dipstick (Completed)    Urine  culture    Acute rhinosinusitis        Use Flonase and antihistamine and Mucinex and if not improved or worsens in a week to give [place] a call.    Relevant Medications    fluticasone (FLONASE) 50 MCG/ACT nasal spray    cefTRIAXone (ROCEPHIN) injection 1 g (Start on 12/23/2015 11:15 AM)    Acute cystitis without hematuria        Relevant Medications    cefTRIAXone (ROCEPHIN) injection 1 g (Start on 12/23/2015 11:15 AM)    Other Relevant Orders    Urine culture        Follow up plan: Return if symptoms worsen or fail to improve.  Counseling provided for all of the vaccine components Orders Placed This Encounter  Procedures  . Urine culture  . POCT UA - Microscopic Only  . POCT urinalysis dipstick    Arville Care, MD [facility] 12/23/2015, 10:23 AM

## 2015-12-25 LAB — URINE CULTURE

## 2015-12-29 ENCOUNTER — Other Ambulatory Visit: Payer: Self-pay | Admitting: Nurse Practitioner

## 2015-12-30 ENCOUNTER — Other Ambulatory Visit: Payer: Self-pay | Admitting: Nurse Practitioner

## 2016-01-03 ENCOUNTER — Ambulatory Visit (INDEPENDENT_AMBULATORY_CARE_PROVIDER_SITE_OTHER): Payer: Medicare Other | Admitting: Nurse Practitioner

## 2016-01-03 ENCOUNTER — Other Ambulatory Visit: Payer: Self-pay | Admitting: Nurse Practitioner

## 2016-01-03 ENCOUNTER — Encounter: Payer: Self-pay | Admitting: Nurse Practitioner

## 2016-01-03 VITALS — BP 116/67 | HR 67 | Temp 96.7°F | Ht <= 58 in | Wt 126.0 lb

## 2016-01-03 DIAGNOSIS — F02818 Dementia in other diseases classified elsewhere, unspecified severity, with other behavioral disturbance: Secondary | ICD-10-CM

## 2016-01-03 DIAGNOSIS — F32A Depression, unspecified: Secondary | ICD-10-CM | POA: Insufficient documentation

## 2016-01-03 DIAGNOSIS — K219 Gastro-esophageal reflux disease without esophagitis: Secondary | ICD-10-CM | POA: Diagnosis not present

## 2016-01-03 DIAGNOSIS — F0281 Dementia in other diseases classified elsewhere with behavioral disturbance: Secondary | ICD-10-CM

## 2016-01-03 DIAGNOSIS — N393 Stress incontinence (female) (male): Secondary | ICD-10-CM

## 2016-01-03 DIAGNOSIS — G4700 Insomnia, unspecified: Secondary | ICD-10-CM | POA: Insufficient documentation

## 2016-01-03 DIAGNOSIS — I5022 Chronic systolic (congestive) heart failure: Secondary | ICD-10-CM

## 2016-01-03 DIAGNOSIS — E785 Hyperlipidemia, unspecified: Secondary | ICD-10-CM

## 2016-01-03 DIAGNOSIS — E039 Hypothyroidism, unspecified: Secondary | ICD-10-CM

## 2016-01-03 DIAGNOSIS — I10 Essential (primary) hypertension: Secondary | ICD-10-CM | POA: Diagnosis not present

## 2016-01-03 DIAGNOSIS — F329 Major depressive disorder, single episode, unspecified: Secondary | ICD-10-CM | POA: Diagnosis not present

## 2016-01-03 DIAGNOSIS — E876 Hypokalemia: Secondary | ICD-10-CM

## 2016-01-03 DIAGNOSIS — Z87440 Personal history of urinary (tract) infections: Secondary | ICD-10-CM

## 2016-01-03 DIAGNOSIS — G3183 Dementia with Lewy bodies: Secondary | ICD-10-CM | POA: Diagnosis not present

## 2016-01-03 DIAGNOSIS — E109 Type 1 diabetes mellitus without complications: Secondary | ICD-10-CM | POA: Diagnosis not present

## 2016-01-03 DIAGNOSIS — J410 Simple chronic bronchitis: Secondary | ICD-10-CM

## 2016-01-03 LAB — POCT URINALYSIS DIPSTICK
Bilirubin, UA: NEGATIVE
Glucose, UA: NEGATIVE
KETONES UA: NEGATIVE
Nitrite, UA: NEGATIVE
PH UA: 5
PROTEIN UA: NEGATIVE
SPEC GRAV UA: 1.015
Urobilinogen, UA: NEGATIVE

## 2016-01-03 LAB — POCT GLYCOSYLATED HEMOGLOBIN (HGB A1C): Hemoglobin A1C: 5.8

## 2016-01-03 MED ORDER — FENOFIBRATE 145 MG PO TABS: 145.0000 mg | ORAL_TABLET | Freq: Every day | ORAL | Status: DC

## 2016-01-03 MED ORDER — CARVEDILOL 25 MG PO TABS: ORAL_TABLET | ORAL | Status: DC

## 2016-01-03 MED ORDER — SOLIFENACIN SUCCINATE 10 MG PO TABS: 10.0000 mg | ORAL_TABLET | Freq: Every day | ORAL | Status: DC

## 2016-01-03 MED ORDER — SAXAGLIPTIN-METFORMIN ER 2.5-1000 MG PO TB24: 1.0000 | ORAL_TABLET | Freq: Two times a day (BID) | ORAL | Status: DC

## 2016-01-03 MED ORDER — OMEPRAZOLE 40 MG PO CPDR: 40.0000 mg | DELAYED_RELEASE_CAPSULE | Freq: Every day | ORAL | Status: DC

## 2016-01-03 MED ORDER — INSULIN GLARGINE 100 UNIT/ML ~~LOC~~ SOLN: SUBCUTANEOUS | Status: DC

## 2016-01-03 MED ORDER — GLUCOSE BLOOD VI STRP: ORAL_STRIP | Status: DC

## 2016-01-03 MED ORDER — AMLODIPINE BESYLATE 5 MG PO TABS: ORAL_TABLET | ORAL | Status: DC

## 2016-01-03 MED ORDER — POTASSIUM CHLORIDE ER 10 MEQ PO TBCR: 20.0000 meq | EXTENDED_RELEASE_TABLET | Freq: Every day | ORAL | Status: DC

## 2016-01-03 MED ORDER — CITALOPRAM HYDROBROMIDE 20 MG PO TABS: ORAL_TABLET | ORAL | Status: DC

## 2016-01-03 MED ORDER — LISINOPRIL 20 MG PO TABS: 20.0000 mg | ORAL_TABLET | Freq: Every day | ORAL | Status: DC

## 2016-01-03 MED ORDER — MEMANTINE HCL 10 MG PO TABS: 10.0000 mg | ORAL_TABLET | Freq: Two times a day (BID) | ORAL | Status: DC

## 2016-01-03 MED ORDER — LEVOTHYROXINE SODIUM 75 MCG PO TABS: 75.0000 ug | ORAL_TABLET | Freq: Every day | ORAL | Status: DC

## 2016-01-03 NOTE — Addendum Note (Signed)
Addended by: HANDY, Lashika Erker N on: 01/03/2016 02:51 PM   Modules accepted: Orders  

## 2016-01-03 NOTE — Addendum Note (Signed)
Addended by: Tommas Olp on: 01/03/2016 02:51 PM   Modules accepted: Orders

## 2016-01-03 NOTE — Progress Notes (Signed)
Subjective:    Patient ID: Jamie Cochran, female    DOB: 05/12/1930, 82 y.o.   MRN: [id number]  Patient here today for follow up of chronic medical problems.  Outpatient Encounter Prescriptions as of 01/03/2016  Medication Sig  . amLODipine (NORVASC) 5 MG tablet TAKE ONE TABLET BY MOUTH ONCE DAILY MUST  BE  SEEN  FOR  ADDITIONAL  REFILLS  . aspirin 81 MG tablet Take 81 mg by mouth daily.  . carvedilol (COREG) 25 MG tablet TAKE ONE TABLET BY MOUTH TWICE DAILY WITH MEALS MUST  BE  SEEN  FOR  ADDITIONAL  REFILLS  . cefUROXime (CEFTIN) 500 MG tablet Take 1 tablet (500 mg total) by mouth 2 (two) times daily with a meal.  . citalopram (CELEXA) 20 MG tablet TAKE ONE TABLET BY MOUTH ONCE DAILY MUST  BE  SEEN  FOR  ADDITIONAL  REFILLS  . fenofibrate (TRICOR) 145 MG tablet Take 1 tablet (145 mg total) by mouth daily.  . fluticasone (FLONASE) 50 MCG/ACT nasal spray Place 1 spray into both nostrils 2 (two) times daily as needed for allergies or rhinitis.  . furosemide (LASIX) 40 MG tablet Take 0.5 tablets (20 mg total) by mouth daily.  Marland Kitchen guaiFENesin-dextromethorphan (ROBITUSSIN DM) 100-10 MG/5ML syrup Take 5 mLs by mouth every 4 (four) hours as needed for cough.  . insulin aspart (NOVOLOG) 100 UNIT/ML injection Inject 0-15 Units into the skin 3 (three) times daily with meals.  . insulin glargine (LANTUS) 100 UNIT/ML injection INJECT 24 UNITS SUBCUTANEOUSLY AT BEDTIME  . levothyroxine (SYNTHROID, LEVOTHROID) 75 MCG tablet Take 1 tablet (75 mcg total) by mouth daily.  Marland Kitchen lisinopril (PRINIVIL,ZESTRIL) 20 MG tablet TAKE ONE TABLET BY MOUTH ONCE DAILY  . loratadine-pseudoephedrine (CLARITIN-D 24 HOUR) 10-240 MG 24 hr tablet Take 1 tablet by mouth daily.  . memantine (NAMENDA) 10 MG tablet TAKE ONE TABLET BY MOUTH TWICE DAILY  . omeprazole (PRILOSEC) 40 MG capsule Take 1 capsule (40 mg total) by mouth daily.  . potassium chloride (K-DUR) 10 MEQ tablet Take 2 tablets (20 mEq total) by mouth daily.  .  Saxagliptin-Metformin (KOMBIGLYZE XR) 2.03-999 MG TB24 Take 1 tablet by mouth 2 (two) times daily.  . solifenacin (VESICARE) 10 MG tablet Take 1 tablet (10 mg total) by mouth daily.    Diabetes She presents for her follow-up diabetic visit. She has type 2 diabetes mellitus. Her disease course has been fluctuating. Pertinent negatives for hypoglycemia include no headaches. Pertinent negatives for diabetes include no chest pain, no fatigue, no polydipsia, no polyphagia, no polyuria and no visual change. Diabetic complications include a CVA. Pertinent negatives for diabetic complications include no retinopathy. Current diabetic treatment includes insulin injections and oral agent (dual therapy) (has not needed sliding scale but maybe 1x a week.). When asked about meal planning, she reported none. She has not had a previous visit with a dietitian. She rarely participates in exercise. Her home blood glucose trend is fluctuating minimally. Her breakfast blood glucose is taken between 8-9 am. Her breakfast blood glucose range is generally 140-180 mg/dl. Her highest blood glucose is >200 mg/dl. She sees a podiatrist.Eye exam is not current.  Hypertension This is a chronic problem. The current episode started more than 1 year ago. The problem has been waxing and waning since onset. Pertinent negatives include no chest pain, headaches, neck pain, palpitations or shortness of breath. Risk factors for coronary artery disease include diabetes mellitus, dyslipidemia, family history, obesity, sedentary lifestyle and post-menopausal state. Past treatments  include ACE inhibitors, calcium channel blockers and beta blockers. The current treatment provides moderate improvement. Compliance problems include diet and exercise.  Hypertensive end-organ damage includes CAD/MI, CVA and a thyroid problem. There is no history of retinopathy.  Congestive Heart Failure Presents for follow-up visit. Pertinent negatives include no chest  pain, fatigue, palpitations or shortness of breath. The symptoms have been stable. Past treatments include ACE inhibitors (she also takes lasix daily and zaroxlyn as needed when swelling worsens and she has SOB.). Her past medical history is significant for CAD, CVA, DM and HTN.  Hyperlipidemia This is a chronic problem. The current episode started more than 1 year ago. The problem is uncontrolled. Recent lipid tests were reviewed and are variable. Pertinent negatives include no chest pain or shortness of breath. She is currently on no antihyperlipidemic treatment.  Thyroid Problem Presents for follow-up visit. Patient reports no constipation, fatigue, hair loss, heat intolerance, palpitations or visual change. The symptoms have been stable. Her past medical history is significant for hyperlipidemia.  Gastroesophageal Reflux She reports no chest pain, no coughing or no nausea. Pertinent negatives include no fatigue. She has tried a PPI for the symptoms. The treatment provided moderate relief. Past procedures include an EGD.  Depression Pt takes Celexa 20 mg-Pt tolerate well with no complaints Insomnia Pt taking Remeron 15 mg- Pt states it helps her sleep hypokalemia K+ supplements daily- no c/o lower ext cramping Stress urinary incontinence Is currently on myrbetriq which helps some but has occasssional accidents COPD Hx of smoking- on symbicort daily- helps with her wheezing and chronic cough- does not have need for rescue inhaler. dementia She is on namenda which helps some-- but very difficult to reorient her - she can get very argumentative at tomes.  *Review of Systems  Constitutional: Negative for fatigue.  Respiratory: Negative for cough and shortness of breath.   Cardiovascular: Negative for chest pain and palpitations.  Gastrointestinal: Negative for nausea and constipation.  Endocrine: Negative for heat intolerance, polydipsia, polyphagia and polyuria.  Musculoskeletal: Negative  for neck pain.  Neurological: Negative for headaches.  All other systems reviewed and are negative.      Objective:   Physical Exam  Constitutional: She appears well-developed and well-nourished.  HENT:  Head: Normocephalic.  Eyes: EOM are normal. Pupils are equal, round, and reactive to light.  Neck: Normal range of motion. Neck supple. No thyromegaly present.  Cardiovascular: Normal rate, regular rhythm, normal heart sounds and intact distal pulses.   No murmur heard. Pulmonary/Chest: Effort normal and breath sounds normal. No respiratory distress. She has no wheezes. She has no rales. She exhibits no tenderness.  Abdominal: Soft. Bowel sounds are normal. She exhibits no distension. There is no tenderness.  Musculoskeletal: Normal range of motion. She exhibits no tenderness. Edema: 2+ edema bil lower ext.  Neurological: She is alert.  Skin: Skin is warm and dry.  Psychiatric: She has a normal mood and affect. Her behavior is normal. Judgment and thought content normal.  Vitals reviewed.   BP 116/67 mmHg  Pulse 67  Temp(Src) 96.7 F (35.9 C) (Oral)  Ht '4\' 8"'  (1.422 m)  Wt 126 lb (57.153 kg)  BMI 28.26 kg/m2  LMP 02/04/1963  Results for orders placed or performed in visit on 01/03/16  POCT glycosylated hemoglobin (Hb A1C)  Result Value Ref Range   Hemoglobin A1C 5.8     Assessment & Plan:  1. Type 1 diabetes mellitus without complication (HCC) Continue to watch carbs in dit - POCT  glycosylated hemoglobin (Hb A1C) - Microalbumin / creatinine urine ratio - Saxagliptin-Metformin (KOMBIGLYZE XR) 2.03-999 MG TB24; Take 1 tablet by mouth 2 (two) times daily.  Dispense: 60 tablet; Refill: 5 - insulin glargine (LANTUS) 100 UNIT/ML injection; INJECT 24 UNITS SUBCUTANEOUSLY AT BEDTIME  Dispense: 10 vial; Refill: 5 - glucose blood test strip; Test up to 3x a day and prn  D E11.9  Dispense: 100 each; Refill: 12  2. Hyperlipidemia Low fta diet - Lipid panel - fenofibrate  (TRICOR) 145 MG tablet; Take 1 tablet (145 mg total) by mouth daily.  Dispense: 30 tablet; Refill: 5  3. Essential hypertension Do not add salt to diet - CMP14+EGFR - carvedilol (COREG) 25 MG tablet; TAKE ONE TABLET BY MOUTH TWICE DAILY WITH MEALS MUST  BE  SEEN  FOR  ADDITIONAL  REFILLS  Dispense: 60 tablet; Refill: 5 - amLODipine (NORVASC) 5 MG tablet; TAKE ONE TABLET BY MOUTH ONCE DAILY MUST  BE  SEEN  FOR  ADDITIONAL  REFILLS  Dispense: 30 tablet; Refill: 5 - lisinopril (PRINIVIL,ZESTRIL) 20 MG tablet; Take 1 tablet (20 mg total) by mouth daily.  Dispense: 30 tablet; Refill: 5  4. Chronic systolic congestive heart failure (HCC) Limit fluid intake  5. Hypothyroidism, unspecified hypothyroidism type - levothyroxine (SYNTHROID, LEVOTHROID) 75 MCG tablet; Take 1 tablet (75 mcg total) by mouth daily.  Dispense: 30 tablet; Refill: 5 - Thyroid Panel With TSH  6. Depression Stress management - citalopram (CELEXA) 20 MG tablet; TAKE ONE TABLET BY MOUTH ONCE DAILY MUST  BE  SEEN  FOR  ADDITIONAL  REFILLS  Dispense: 30 tablet; Refill: 5  7. Insomnia Bedtime ritual  8. Hypokalemia - potassium chloride (K-DUR) 10 MEQ tablet; Take 2 tablets (20 mEq total) by mouth daily.  Dispense: 60 tablet; Refill: 5  9. SUI (stress urinary incontinence, female)  10. Simple chronic bronchitis (HCC) Avoid cigarette smoke  11. Gastroesophageal reflux disease without esophagitis Avoid spicy foods Do not eat 2 hours prior to bedtime - omeprazole (PRILOSEC) 40 MG capsule; Take 1 capsule (40 mg total) by mouth daily.  Dispense: 30 capsule; Refill: 5  12. Stress incontinence - solifenacin (VESICARE) 10 MG tablet; Take 1 tablet (10 mg total) by mouth daily.  Dispense: 30 tablet; Refill: 5  13. Lewy body dementia with behavioral disturbance Continue to orient - memantine (NAMENDA) 10 MG tablet; Take 1 tablet (10 mg total) by mouth 2 (two) times daily.  Dispense: 30 tablet; Refill: 5  14. Hx: UTI (urinary  tract infection)     Labs pending Health maintenance reviewed Diet and exercise encouraged Continue all meds Follow up  In 3 month   [place], FNP

## 2016-01-04 LAB — CMP14+EGFR
A/G RATIO: 1.6 (ref 1.1–2.5)
ALT: 15 IU/L (ref 0–32)
AST: 17 IU/L (ref 0–40)
Albumin: 3.5 g/dL (ref 3.5–4.7)
Alkaline Phosphatase: 44 IU/L (ref 39–117)
BUN/Creatinine Ratio: 24 (ref 11–26)
BUN: 21 mg/dL (ref 8–27)
Bilirubin Total: 0.3 mg/dL (ref 0.0–1.2)
CALCIUM: 9.5 mg/dL (ref 8.7–10.3)
CO2: 29 mmol/L (ref 18–29)
CREATININE: 0.89 mg/dL (ref 0.57–1.00)
Chloride: 99 mmol/L (ref 96–106)
GFR, EST AFRICAN AMERICAN: 69 mL/min/{1.73_m2} (ref >59)
GFR, EST NON AFRICAN AMERICAN: 60 mL/min/{1.73_m2} (ref >59)
GLOBULIN, TOTAL: 2.2 g/dL (ref 1.5–4.5)
Glucose: 138 mg/dL — ABNORMAL HIGH (ref 65–99)
POTASSIUM: 3.7 mmol/L (ref 3.5–5.2)
Sodium: 143 mmol/L (ref 134–144)
TOTAL PROTEIN: 5.7 g/dL — AB (ref 6.0–8.5)

## 2016-01-04 LAB — LIPID PANEL
CHOL/HDL RATIO: 4.9 ratio — AB (ref 0.0–4.4)
Cholesterol, Total: 142 mg/dL (ref 100–199)
HDL: 29 mg/dL — AB (ref >39)
LDL CALC: 73 mg/dL (ref 0–99)
Triglycerides: 202 mg/dL — ABNORMAL HIGH (ref 0–149)
VLDL Cholesterol Cal: 40 mg/dL (ref 5–40)

## 2016-01-04 LAB — THYROID PANEL WITH TSH
FREE THYROXINE INDEX: 2.9 (ref 1.2–4.9)
T3 UPTAKE RATIO: 30 % (ref 24–39)
T4, Total: 9.5 ug/dL (ref 4.5–12.0)
TSH: 1.23 u[IU]/mL (ref 0.450–4.500)

## 2016-01-04 LAB — MICROALBUMIN / CREATININE URINE RATIO
CREATININE, UR: 34.9 mg/dL
MICROALB/CREAT RATIO: 28.9 mg/g{creat} (ref 0.0–30.0)
MICROALBUM., U, RANDOM: 10.1 ug/mL

## 2016-01-06 ENCOUNTER — Telehealth: Payer: Self-pay | Admitting: Nurse Practitioner

## 2016-01-07 MED ORDER — GLUCOSE BLOOD VI STRP: ORAL_STRIP | Status: DC

## 2016-01-07 NOTE — Telephone Encounter (Signed)
done

## 2016-02-24 ENCOUNTER — Ambulatory Visit: Payer: Medicare Other

## 2016-04-04 ENCOUNTER — Encounter: Payer: Self-pay | Admitting: Nurse Practitioner

## 2016-04-04 ENCOUNTER — Encounter (INDEPENDENT_AMBULATORY_CARE_PROVIDER_SITE_OTHER): Payer: Self-pay

## 2016-04-04 ENCOUNTER — Encounter: Payer: Self-pay | Admitting: *Deleted

## 2016-04-04 ENCOUNTER — Ambulatory Visit (INDEPENDENT_AMBULATORY_CARE_PROVIDER_SITE_OTHER): Payer: Medicare Other | Admitting: Nurse Practitioner

## 2016-04-04 VITALS — BP 154/70 | HR 59 | Temp 97.1°F | Ht <= 58 in | Wt 136.0 lb

## 2016-04-04 DIAGNOSIS — E109 Type 1 diabetes mellitus without complications: Secondary | ICD-10-CM

## 2016-04-04 DIAGNOSIS — E119 Type 2 diabetes mellitus without complications: Secondary | ICD-10-CM | POA: Diagnosis not present

## 2016-04-04 DIAGNOSIS — F32A Depression, unspecified: Secondary | ICD-10-CM

## 2016-04-04 DIAGNOSIS — Z794 Long term (current) use of insulin: Secondary | ICD-10-CM

## 2016-04-04 DIAGNOSIS — I5022 Chronic systolic (congestive) heart failure: Secondary | ICD-10-CM

## 2016-04-04 DIAGNOSIS — E876 Hypokalemia: Secondary | ICD-10-CM

## 2016-04-04 DIAGNOSIS — E785 Hyperlipidemia, unspecified: Secondary | ICD-10-CM | POA: Diagnosis not present

## 2016-04-04 DIAGNOSIS — F0281 Dementia in other diseases classified elsewhere with behavioral disturbance: Secondary | ICD-10-CM

## 2016-04-04 DIAGNOSIS — F02818 Dementia in other diseases classified elsewhere, unspecified severity, with other behavioral disturbance: Secondary | ICD-10-CM

## 2016-04-04 DIAGNOSIS — N393 Stress incontinence (female) (male): Secondary | ICD-10-CM

## 2016-04-04 DIAGNOSIS — G3183 Dementia with Lewy bodies: Secondary | ICD-10-CM

## 2016-04-04 DIAGNOSIS — K219 Gastro-esophageal reflux disease without esophagitis: Secondary | ICD-10-CM | POA: Diagnosis not present

## 2016-04-04 DIAGNOSIS — G4700 Insomnia, unspecified: Secondary | ICD-10-CM

## 2016-04-04 DIAGNOSIS — I10 Essential (primary) hypertension: Secondary | ICD-10-CM

## 2016-04-04 DIAGNOSIS — F329 Major depressive disorder, single episode, unspecified: Secondary | ICD-10-CM

## 2016-04-04 DIAGNOSIS — J410 Simple chronic bronchitis: Secondary | ICD-10-CM

## 2016-04-04 DIAGNOSIS — E039 Hypothyroidism, unspecified: Secondary | ICD-10-CM

## 2016-04-04 LAB — CMP14+EGFR
A/G RATIO: 1.8 (ref 1.2–2.2)
ALT: 12 IU/L (ref 0–32)
AST: 16 IU/L (ref 0–40)
Albumin: 4.2 g/dL (ref 3.5–4.7)
Alkaline Phosphatase: 40 IU/L (ref 39–117)
BILIRUBIN TOTAL: 0.3 mg/dL (ref 0.0–1.2)
BUN/Creatinine Ratio: 27 (ref 12–28)
BUN: 25 mg/dL (ref 8–27)
CALCIUM: 10.1 mg/dL (ref 8.7–10.3)
CHLORIDE: 96 mmol/L (ref 96–106)
CO2: 30 mmol/L — ABNORMAL HIGH (ref 18–29)
Creatinine, Ser: 0.91 mg/dL (ref 0.57–1.00)
GFR calc Af Amer: 67 mL/min/{1.73_m2} (ref >59)
GFR, EST NON AFRICAN AMERICAN: 59 mL/min/{1.73_m2} — AB (ref >59)
Globulin, Total: 2.4 g/dL (ref 1.5–4.5)
Glucose: 133 mg/dL — ABNORMAL HIGH (ref 65–99)
POTASSIUM: 3.9 mmol/L (ref 3.5–5.2)
Sodium: 140 mmol/L (ref 134–144)
Total Protein: 6.6 g/dL (ref 6.0–8.5)

## 2016-04-04 LAB — LIPID PANEL
CHOLESTEROL TOTAL: 144 mg/dL (ref 100–199)
Chol/HDL Ratio: 4.1 ratio units (ref 0.0–4.4)
HDL: 35 mg/dL — AB (ref >39)
LDL Calculated: 74 mg/dL (ref 0–99)
TRIGLYCERIDES: 176 mg/dL — AB (ref 0–149)
VLDL Cholesterol Cal: 35 mg/dL (ref 5–40)

## 2016-04-04 LAB — BAYER DCA HB A1C WAIVED: HB A1C: 6.2 % (ref <7.0)

## 2016-04-04 MED ORDER — SAXAGLIPTIN-METFORMIN ER 2.5-1000 MG PO TB24: 1.0000 | ORAL_TABLET | Freq: Two times a day (BID) | ORAL | Status: DC

## 2016-04-04 MED ORDER — INSULIN GLARGINE 100 UNIT/ML ~~LOC~~ SOLN: SUBCUTANEOUS | Status: DC

## 2016-04-04 MED ORDER — CITALOPRAM HYDROBROMIDE 20 MG PO TABS: ORAL_TABLET | ORAL | Status: DC

## 2016-04-04 MED ORDER — FENOFIBRATE 145 MG PO TABS: 145.0000 mg | ORAL_TABLET | Freq: Every day | ORAL | Status: DC

## 2016-04-04 MED ORDER — OMEPRAZOLE 40 MG PO CPDR: 40.0000 mg | DELAYED_RELEASE_CAPSULE | Freq: Every day | ORAL | Status: DC

## 2016-04-04 MED ORDER — LISINOPRIL 20 MG PO TABS: 20.0000 mg | ORAL_TABLET | Freq: Every day | ORAL | Status: DC

## 2016-04-04 MED ORDER — AMLODIPINE BESYLATE 5 MG PO TABS: ORAL_TABLET | ORAL | Status: DC

## 2016-04-04 MED ORDER — MEMANTINE HCL 10 MG PO TABS: 10.0000 mg | ORAL_TABLET | Freq: Two times a day (BID) | ORAL | Status: DC

## 2016-04-04 MED ORDER — SOLIFENACIN SUCCINATE 10 MG PO TABS
10.0000 mg | ORAL_TABLET | Freq: Every day | ORAL | Status: DC
Start: 2016-04-04 — End: 2016-11-17

## 2016-04-04 MED ORDER — FUROSEMIDE 40 MG PO TABS: 20.0000 mg | ORAL_TABLET | Freq: Every day | ORAL | Status: DC

## 2016-04-04 MED ORDER — POTASSIUM CHLORIDE ER 10 MEQ PO TBCR: 20.0000 meq | EXTENDED_RELEASE_TABLET | Freq: Every day | ORAL | Status: DC

## 2016-04-04 MED ORDER — CARVEDILOL 25 MG PO TABS: ORAL_TABLET | ORAL | Status: DC

## 2016-04-04 MED ORDER — LEVOTHYROXINE SODIUM 75 MCG PO TABS: 75.0000 ug | ORAL_TABLET | Freq: Every day | ORAL | Status: DC

## 2016-04-04 NOTE — Progress Notes (Signed)
Subjective:    Patient ID: Jamie Cochran, female    DOB: 1930/06/30, 82 y.o.   MRN: [id number]  Patient here today for follow up of chronic medical problems.  Outpatient Encounter Prescriptions as of 01/03/2016  Medication Sig  . amLODipine (NORVASC) 5 MG tablet TAKE ONE TABLET BY MOUTH ONCE DAILY MUST  BE  SEEN  FOR  ADDITIONAL  REFILLS  . aspirin 81 MG tablet Take 81 mg by mouth daily.  . carvedilol (COREG) 25 MG tablet TAKE ONE TABLET BY MOUTH TWICE DAILY WITH MEALS MUST  BE  SEEN  FOR  ADDITIONAL  REFILLS  . cefUROXime (CEFTIN) 500 MG tablet Take 1 tablet (500 mg total) by mouth 2 (two) times daily with a meal.  . citalopram (CELEXA) 20 MG tablet TAKE ONE TABLET BY MOUTH ONCE DAILY MUST  BE  SEEN  FOR  ADDITIONAL  REFILLS  . fenofibrate (TRICOR) 145 MG tablet Take 1 tablet (145 mg total) by mouth daily.  . fluticasone (FLONASE) 50 MCG/ACT nasal spray Place 1 spray into both nostrils 2 (two) times daily as needed for allergies or rhinitis.  . furosemide (LASIX) 40 MG tablet Take 0.5 tablets (20 mg total) by mouth daily.  Marland Kitchen guaiFENesin-dextromethorphan (ROBITUSSIN DM) 100-10 MG/5ML syrup Take 5 mLs by mouth every 4 (four) hours as needed for cough.  . insulin aspart (NOVOLOG) 100 UNIT/ML injection Inject 0-15 Units into the skin 3 (three) times daily with meals.  . insulin glargine (LANTUS) 100 UNIT/ML injection INJECT 24 UNITS SUBCUTANEOUSLY AT BEDTIME  . levothyroxine (SYNTHROID, LEVOTHROID) 75 MCG tablet Take 1 tablet (75 mcg total) by mouth daily.  Marland Kitchen lisinopril (PRINIVIL,ZESTRIL) 20 MG tablet TAKE ONE TABLET BY MOUTH ONCE DAILY  . loratadine-pseudoephedrine (CLARITIN-D 24 HOUR) 10-240 MG 24 hr tablet Take 1 tablet by mouth daily.  . memantine (NAMENDA) 10 MG tablet TAKE ONE TABLET BY MOUTH TWICE DAILY  . omeprazole (PRILOSEC) 40 MG capsule Take 1 capsule (40 mg total) by mouth daily.  . potassium chloride (K-DUR) 10 MEQ tablet Take 2 tablets (20 mEq total) by mouth daily.  .  Saxagliptin-Metformin (KOMBIGLYZE XR) 2.03-999 MG TB24 Take 1 tablet by mouth 2 (two) times daily.  . solifenacin (VESICARE) 10 MG tablet Take 1 tablet (10 mg total) by mouth daily.    Diabetes She presents for her follow-up diabetic visit. She has type 2 diabetes mellitus. Her disease course has been fluctuating. Pertinent negatives for hypoglycemia include no headaches. Pertinent negatives for diabetes include no chest pain, no fatigue, no polydipsia, no polyphagia, no polyuria and no visual change. Diabetic complications include a CVA. Pertinent negatives for diabetic complications include no retinopathy. Current diabetic treatment includes insulin injections and oral agent (dual therapy) (only needs insulin 1x a week- not sure wgat causes blodd sugar to go up 1x per week.). When asked about meal planning, she reported none. She has not had a previous visit with a dietitian. She rarely participates in exercise. Her home blood glucose trend is fluctuating minimally. Her breakfast blood glucose is taken between 8-9 am. Her breakfast blood glucose range is generally 140-180 mg/dl. Her highest blood glucose is >200 mg/dl. She sees a podiatrist.Eye exam is not current.  Hypertension This is a chronic problem. The current episode started more than 1 year ago. The problem has been waxing and waning since onset. Pertinent negatives include no chest pain, headaches, neck pain, palpitations or shortness of breath. Risk factors for coronary artery disease include diabetes mellitus, dyslipidemia, family history,  obesity, sedentary lifestyle and post-menopausal state. Past treatments include ACE inhibitors, calcium channel blockers and beta blockers. The current treatment provides moderate improvement. Compliance problems include diet and exercise.  Hypertensive end-organ damage includes CAD/MI, CVA and a thyroid problem. There is no history of retinopathy.  Congestive Heart Failure Presents for follow-up visit.  Pertinent negatives include no chest pain, fatigue, palpitations or shortness of breath. The symptoms have been stable. Past treatments include ACE inhibitors (she also takes lasix daily and zaroxlyn as needed when swelling worsens and she has SOB.). Her past medical history is significant for CAD, CVA, DM and HTN.  Hyperlipidemia This is a chronic problem. The current episode started more than 1 year ago. The problem is uncontrolled. Recent lipid tests were reviewed and are variable. Pertinent negatives include no chest pain or shortness of breath. She is currently on no antihyperlipidemic treatment.  Thyroid Problem Presents for follow-up visit. Patient reports no constipation, fatigue, hair loss, heat intolerance, palpitations or visual change. The symptoms have been stable. Her past medical history is significant for hyperlipidemia.  Gastroesophageal Reflux She reports no chest pain, no coughing or no nausea. Pertinent negatives include no fatigue. She has tried a PPI for the symptoms. The treatment provided moderate relief. Past procedures include an EGD.  Depression Pt takes Celexa 20 mg-Pt tolerate well with no complaints Insomnia Pt taking Remeron 15 mg- Pt states it helps her sleep hypokalemia K+ supplements daily- no c/o lower ext cramping Stress urinary incontinence Is currently on myrbetriq which helps some but has occasssional accidents COPD Hx of smoking- on symbicort daily- helps with her wheezing and chronic cough- does not have need for rescue inhaler. dementia She is on namenda which helps some-- but very difficult to reorient her - she can get very argumentative at tomes.  *Review of Systems  Constitutional: Negative for fatigue.  Respiratory: Negative for cough and shortness of breath.   Cardiovascular: Negative for chest pain and palpitations.  Gastrointestinal: Negative for nausea and constipation.  Endocrine: Negative for heat intolerance, polydipsia, polyphagia and  polyuria.  Musculoskeletal: Negative for neck pain.  Neurological: Negative for headaches.  All other systems reviewed and are negative.      Objective:   Physical Exam  Constitutional: She appears well-developed and well-nourished.  HENT:  Head: Normocephalic.  Eyes: EOM are normal. Pupils are equal, round, and reactive to light.  Neck: Normal range of motion. Neck supple. No thyromegaly present.  Cardiovascular: Normal rate, regular rhythm, normal heart sounds and intact distal pulses.   No murmur heard. Pulmonary/Chest: Effort normal and breath sounds normal. No respiratory distress. She has no wheezes. She has no rales. She exhibits no tenderness.  Abdominal: Soft. Bowel sounds are normal. She exhibits no distension. There is no tenderness.  Musculoskeletal: Normal range of motion. She exhibits no tenderness. Edema: 2+ edema bil lower ext.  Neurological: She is alert.  Skin: Skin is warm and dry.  Psychiatric: She has a normal mood and affect. Her behavior is normal. Judgment and thought content normal.  Vitals reviewed.   BP 154/70 mmHg  Pulse 59  Temp(Src) 97.1 F (36.2 C) (Oral)  Ht _0  (1.422 m)  Wt 136 lb (61.689 kg)  BMI 30.51 kg/m2  LMP 02/04/1963  hgba1c 6.2%   Assessment & Plan:  1. Type 1 diabetes mellitus without complication (HCC) Continue to watch carbsin diet - Bayer DCA Hb A1c Waived - Saxagliptin-Metformin (KOMBIGLYZE XR) 2.03-999 MG TB24; Take 1 tablet by mouth 2 (two) times  daily.  Dispense: 60 tablet; Refill: 5 - insulin glargine (LANTUS) 100 UNIT/ML injection; INJECT 24 UNITS SUBCUTANEOUSLY AT BEDTIME  Dispense: 10 vial; Refill: 5  2. Hyperlipidemia Low fat diet - Lipid panel - fenofibrate (TRICOR) 145 MG tablet; Take 1 tablet (145 mg total) by mouth daily.  Dispense: 30 tablet; Refill: 5  3. Essential hypertension Do not salt to diet - CMP14+EGFR - lisinopril (PRINIVIL,ZESTRIL) 20 MG tablet; Take 1 tablet (20 mg total) by mouth daily.   Dispense: 30 tablet; Refill: 5 - carvedilol (COREG) 25 MG tablet; TAKE ONE TABLET BY MOUTH TWICE DAILY WITH MEALS MUST  BE  SEEN  FOR  ADDITIONAL  REFILLS  Dispense: 60 tablet; Refill: 5 - amLODipine (NORVASC) 5 MG tablet; TAKE ONE TABLET BY MOUTH ONCE DAILY MUST  BE  SEEN  FOR  ADDITIONAL  REFILLS  Dispense: 30 tablet; Refill: 5  4. Chronic systolic congestive heart failure (HCC) - furosemide (LASIX) 40 MG tablet; Take 0.5 tablets (20 mg total) by mouth daily.  Dispense: 30 tablet  5. Simple chronic bronchitis (HCC)   6. Stress incontinence, female Void every 3-4 hours when awake - solifenacin (VESICARE) 10 MG tablet; Take 1 tablet (10 mg total) by mouth daily.  Dispense: 30 tablet; Refill: 5   7. Depression - citalopram (CELEXA) 20 MG tablet; TAKE ONE TABLET BY MOUTH ONCE DAILY MUST  BE  SEEN  FOR  ADDITIONAL  REFILLS  Dispense: 30 tablet; Refill: 5  8. Insomnia Bedtime ritual  9. Hypokalemia - potassium chloride (K-DUR) 10 MEQ tablet; Take 2 tablets (20 mEq total) by mouth daily.  Dispense: 60 tablet; Refill: 5  10. Gastroesophageal reflux disease without esophagitis Avoid spicy foods Do not eat 2 hours prior to bedtime - omeprazole (PRILOSEC) 40 MG capsule; Take 1 capsule (40 mg total) by mouth daily.  Dispense: 30 capsule; Refill: 5  11. Hypothyroidism, unspecified hypothyroidism type - levothyroxine (SYNTHROID, LEVOTHROID) 75 MCG tablet; Take 1 tablet (75 mcg total) by mouth daily.  Dispense: 30 tablet; Refill: 5  12. Lewy body dementia with behavioral disturbance - memantine (NAMENDA) 10 MG tablet; Take 1 tablet (10 mg total) by mouth 2 (two) times daily.  Dispense: 30 tablet; Refill: 5    Labs pending Health maintenance reviewed Diet and exercise encouraged Continue all meds Follow up  In 3 month   [place], FNP

## 2016-05-29 ENCOUNTER — Emergency Department (HOSPITAL_COMMUNITY): Payer: Medicare Other

## 2016-05-29 ENCOUNTER — Observation Stay (HOSPITAL_COMMUNITY)
Admission: EM | Admit: 2016-05-29 | Discharge: 2016-05-30 | Disposition: A | Discharge status: Home / Self Care | Payer: Medicare Other | Attending: Internal Medicine | Admitting: Internal Medicine

## 2016-05-29 ENCOUNTER — Encounter (HOSPITAL_COMMUNITY): Payer: Self-pay | Admitting: Emergency Medicine

## 2016-05-29 DIAGNOSIS — I509 Heart failure, unspecified: Secondary | ICD-10-CM | POA: Diagnosis not present

## 2016-05-29 DIAGNOSIS — Z794 Long term (current) use of insulin: Secondary | ICD-10-CM | POA: Insufficient documentation

## 2016-05-29 DIAGNOSIS — M542 Cervicalgia: Principal | ICD-10-CM | POA: Insufficient documentation

## 2016-05-29 DIAGNOSIS — N179 Acute kidney failure, unspecified: Secondary | ICD-10-CM | POA: Diagnosis present

## 2016-05-29 DIAGNOSIS — M810 Age-related osteoporosis without current pathological fracture: Secondary | ICD-10-CM | POA: Insufficient documentation

## 2016-05-29 DIAGNOSIS — Z79899 Other long term (current) drug therapy: Secondary | ICD-10-CM | POA: Diagnosis not present

## 2016-05-29 DIAGNOSIS — E119 Type 2 diabetes mellitus without complications: Secondary | ICD-10-CM

## 2016-05-29 DIAGNOSIS — I2510 Atherosclerotic heart disease of native coronary artery without angina pectoris: Secondary | ICD-10-CM | POA: Diagnosis not present

## 2016-05-29 DIAGNOSIS — I10 Essential (primary) hypertension: Secondary | ICD-10-CM | POA: Diagnosis present

## 2016-05-29 DIAGNOSIS — Z7982 Long term (current) use of aspirin: Secondary | ICD-10-CM | POA: Diagnosis not present

## 2016-05-29 DIAGNOSIS — E785 Hyperlipidemia, unspecified: Secondary | ICD-10-CM | POA: Insufficient documentation

## 2016-05-29 DIAGNOSIS — J0100 Acute maxillary sinusitis, unspecified: Secondary | ICD-10-CM | POA: Insufficient documentation

## 2016-05-29 DIAGNOSIS — I110 Hypertensive heart disease with heart failure: Secondary | ICD-10-CM | POA: Insufficient documentation

## 2016-05-29 DIAGNOSIS — E039 Hypothyroidism, unspecified: Secondary | ICD-10-CM | POA: Diagnosis present

## 2016-05-29 DIAGNOSIS — F0390 Unspecified dementia without behavioral disturbance: Secondary | ICD-10-CM | POA: Diagnosis present

## 2016-05-29 DIAGNOSIS — R509 Fever, unspecified: Secondary | ICD-10-CM | POA: Diagnosis present

## 2016-05-29 DIAGNOSIS — M5090 Cervical disc disorder, unspecified, unspecified cervical region: Secondary | ICD-10-CM | POA: Diagnosis present

## 2016-05-29 DIAGNOSIS — I5032 Chronic diastolic (congestive) heart failure: Secondary | ICD-10-CM | POA: Diagnosis present

## 2016-05-29 DIAGNOSIS — R531 Weakness: Secondary | ICD-10-CM

## 2016-05-29 DIAGNOSIS — R51 Headache: Secondary | ICD-10-CM | POA: Insufficient documentation

## 2016-05-29 DIAGNOSIS — I5022 Chronic systolic (congestive) heart failure: Secondary | ICD-10-CM | POA: Diagnosis present

## 2016-05-29 DIAGNOSIS — E876 Hypokalemia: Secondary | ICD-10-CM | POA: Diagnosis present

## 2016-05-29 LAB — CBC WITH DIFFERENTIAL/PLATELET
BASOS PCT: 0 %
Basophils Absolute: 0 10*3/uL (ref 0.0–0.1)
Eosinophils Absolute: 0 10*3/uL (ref 0.0–0.7)
Eosinophils Relative: 1 %
HEMATOCRIT: 35.4 % — AB (ref 36.0–46.0)
HEMOGLOBIN: 11.8 g/dL — AB (ref 12.0–15.0)
LYMPHS ABS: 1 10*3/uL (ref 0.7–4.0)
Lymphocytes Relative: 18 %
MCH: 29.6 pg (ref 26.0–34.0)
MCHC: 33.3 g/dL (ref 30.0–36.0)
MCV: 88.9 fL (ref 78.0–100.0)
MONOS PCT: 11 %
Monocytes Absolute: 0.6 10*3/uL (ref 0.1–1.0)
NEUTROS ABS: 3.9 10*3/uL (ref 1.7–7.7)
NEUTROS PCT: 70 %
Platelets: 149 10*3/uL — ABNORMAL LOW (ref 150–400)
RBC: 3.98 MIL/uL (ref 3.87–5.11)
RDW: 13.3 % (ref 11.5–15.5)
WBC: 5.7 10*3/uL (ref 4.0–10.5)

## 2016-05-29 LAB — URINALYSIS, ROUTINE W REFLEX MICROSCOPIC
BILIRUBIN URINE: NEGATIVE
Glucose, UA: NEGATIVE mg/dL
KETONES UR: NEGATIVE mg/dL
Leukocytes, UA: NEGATIVE
NITRITE: NEGATIVE
PROTEIN: NEGATIVE mg/dL
Specific Gravity, Urine: 1.015 (ref 1.005–1.030)
pH: 6 (ref 5.0–8.0)

## 2016-05-29 LAB — URINE MICROSCOPIC-ADD ON

## 2016-05-29 LAB — COMPREHENSIVE METABOLIC PANEL
ALK PHOS: 35 U/L — AB (ref 38–126)
ALT: 14 U/L (ref 14–54)
AST: 18 U/L (ref 15–41)
Albumin: 3.8 g/dL (ref 3.5–5.0)
Anion gap: 6 (ref 5–15)
BUN: 16 mg/dL (ref 6–20)
CALCIUM: 9.3 mg/dL (ref 8.9–10.3)
CHLORIDE: 98 mmol/L — AB (ref 101–111)
CO2: 31 mmol/L (ref 22–32)
CREATININE: 1.22 mg/dL — AB (ref 0.44–1.00)
GFR, EST AFRICAN AMERICAN: 46 mL/min — AB (ref >60)
GFR, EST NON AFRICAN AMERICAN: 40 mL/min — AB (ref >60)
Glucose, Bld: 180 mg/dL — ABNORMAL HIGH (ref 65–99)
Potassium: 3.3 mmol/L — ABNORMAL LOW (ref 3.5–5.1)
SODIUM: 135 mmol/L (ref 135–145)
Total Bilirubin: 0.5 mg/dL (ref 0.3–1.2)
Total Protein: 7.3 g/dL (ref 6.5–8.1)

## 2016-05-29 LAB — I-STAT CG4 LACTIC ACID, ED: LACTIC ACID, VENOUS: 1.68 mmol/L (ref 0.5–1.9)

## 2016-05-29 MED ORDER — FENTANYL CITRATE (PF) 100 MCG/2ML IJ SOLN
25.0000 ug | Freq: Once | INTRAMUSCULAR | Status: AC
  Administered 2016-05-29: 25 ug via INTRAVENOUS
  Filled 2016-05-29: qty 2

## 2016-05-29 MED ORDER — ACETAMINOPHEN 325 MG PO TABS: 650.0000 mg | ORAL_TABLET | Freq: Four times a day (QID) | ORAL | Status: DC | PRN

## 2016-05-29 MED ORDER — LISINOPRIL 10 MG PO TABS
20.0000 mg | ORAL_TABLET | Freq: Every day | ORAL | Status: DC
  Administered 2016-05-30: 20 mg via ORAL
  Filled 2016-05-29: qty 2

## 2016-05-29 MED ORDER — CITALOPRAM HYDROBROMIDE 20 MG PO TABS
20.0000 mg | ORAL_TABLET | Freq: Every day | ORAL | Status: DC
  Administered 2016-05-30: 20 mg via ORAL
  Filled 2016-05-29: qty 1

## 2016-05-29 MED ORDER — MORPHINE SULFATE (PF) 2 MG/ML IV SOLN: 2.0000 mg | INTRAVENOUS | Status: DC | PRN

## 2016-05-29 MED ORDER — ONDANSETRON HCL 4 MG PO TABS: 4.0000 mg | ORAL_TABLET | Freq: Four times a day (QID) | ORAL | Status: DC | PRN

## 2016-05-29 MED ORDER — ONDANSETRON HCL 4 MG/2ML IJ SOLN: 4.0000 mg | Freq: Four times a day (QID) | INTRAMUSCULAR | Status: DC | PRN

## 2016-05-29 MED ORDER — CARVEDILOL 12.5 MG PO TABS
25.0000 mg | ORAL_TABLET | Freq: Two times a day (BID) | ORAL | Status: DC
  Administered 2016-05-30 (×2): 25 mg via ORAL
  Filled 2016-05-29 (×2): qty 2

## 2016-05-29 MED ORDER — LORATADINE-PSEUDOEPHEDRINE ER 10-240 MG PO TB24: 1.0000 | ORAL_TABLET | Freq: Every day | ORAL | Status: DC

## 2016-05-29 MED ORDER — INSULIN ASPART 100 UNIT/ML ~~LOC~~ SOLN
0.0000 [IU] | Freq: Three times a day (TID) | SUBCUTANEOUS | Status: DC
  Administered 2016-05-30: 1 [IU] via SUBCUTANEOUS

## 2016-05-29 MED ORDER — MEMANTINE HCL 10 MG PO TABS
10.0000 mg | ORAL_TABLET | Freq: Two times a day (BID) | ORAL | Status: DC
  Administered 2016-05-30 (×2): 10 mg via ORAL
  Filled 2016-05-29 (×2): qty 1

## 2016-05-29 MED ORDER — FENOFIBRATE 160 MG PO TABS
160.0000 mg | ORAL_TABLET | Freq: Every day | ORAL | Status: DC
  Administered 2016-05-30: 160 mg via ORAL
  Filled 2016-05-29: qty 1

## 2016-05-29 MED ORDER — SODIUM CHLORIDE 0.9 % IV BOLUS (SEPSIS)
500.0000 mL | Freq: Once | INTRAVENOUS | Status: AC
  Administered 2016-05-29: 500 mL via INTRAVENOUS

## 2016-05-29 MED ORDER — LEVOTHYROXINE SODIUM 75 MCG PO TABS
75.0000 ug | ORAL_TABLET | Freq: Every day | ORAL | Status: DC
  Administered 2016-05-30: 75 ug via ORAL
  Filled 2016-05-29: qty 1

## 2016-05-29 MED ORDER — AMLODIPINE BESYLATE 5 MG PO TABS
5.0000 mg | ORAL_TABLET | Freq: Every day | ORAL | Status: DC
  Administered 2016-05-30: 5 mg via ORAL
  Filled 2016-05-29: qty 1

## 2016-05-29 MED ORDER — ENOXAPARIN SODIUM 30 MG/0.3ML ~~LOC~~ SOLN
30.0000 mg | SUBCUTANEOUS | Status: DC
  Administered 2016-05-30: 30 mg via SUBCUTANEOUS
  Filled 2016-05-29: qty 0.3

## 2016-05-29 MED ORDER — ACETAMINOPHEN 650 MG RE SUPP: 650.0000 mg | Freq: Four times a day (QID) | RECTAL | Status: DC | PRN

## 2016-05-29 MED ORDER — INSULIN GLARGINE 100 UNIT/ML ~~LOC~~ SOLN
12.0000 [IU] | Freq: Every day | SUBCUTANEOUS | Status: DC
  Administered 2016-05-30: 12 [IU] via SUBCUTANEOUS
  Filled 2016-05-29 (×2): qty 0.12

## 2016-05-29 MED ORDER — OXYCODONE HCL 5 MG PO TABS
5.0000 mg | ORAL_TABLET | ORAL | Status: DC | PRN
  Administered 2016-05-30 (×2): 5 mg via ORAL
  Filled 2016-05-29 (×2): qty 1

## 2016-05-29 MED ORDER — ASPIRIN EC 81 MG PO TBEC
81.0000 mg | DELAYED_RELEASE_TABLET | Freq: Every day | ORAL | Status: DC
  Administered 2016-05-30: 81 mg via ORAL
  Filled 2016-05-29: qty 1

## 2016-05-29 MED ORDER — SENNA 8.6 MG PO TABS
1.0000 | ORAL_TABLET | Freq: Two times a day (BID) | ORAL | Status: DC
  Administered 2016-05-30 (×2): 8.6 mg via ORAL
  Filled 2016-05-29 (×2): qty 1

## 2016-05-29 MED ORDER — DOCUSATE SODIUM 100 MG PO CAPS
100.0000 mg | ORAL_CAPSULE | Freq: Two times a day (BID) | ORAL | Status: DC
  Administered 2016-05-30 (×2): 100 mg via ORAL
  Filled 2016-05-29 (×2): qty 1

## 2016-05-29 MED ORDER — LACTATED RINGERS IV SOLN
INTRAVENOUS | Status: DC
  Administered 2016-05-30 (×2): via INTRAVENOUS

## 2016-05-29 MED ORDER — PANTOPRAZOLE SODIUM 40 MG PO TBEC
40.0000 mg | DELAYED_RELEASE_TABLET | Freq: Every day | ORAL | Status: DC
Start: 2016-05-30 — End: 2016-05-30
  Administered 2016-05-30: 40 mg via ORAL
  Filled 2016-05-29: qty 1

## 2016-05-29 MED ORDER — POTASSIUM CHLORIDE CRYS ER 20 MEQ PO TBCR
20.0000 meq | EXTENDED_RELEASE_TABLET | Freq: Every day | ORAL | Status: DC
  Administered 2016-05-30: 20 meq via ORAL
  Filled 2016-05-29: qty 2
  Filled 2016-05-29: qty 1
  Filled 2016-05-29: qty 2

## 2016-05-29 NOTE — ED Provider Notes (Signed)
CSN: [id number]     Arrival date & time 05/29/16  1629 History   First MD Initiated Contact with Patient 05/29/16 1716     Chief Complaint  Patient presents with  . Neck Pain     (Consider location/radiation/quality/duration/timing/severity/associated sxs/prior Treatment) HPI Patient presents with neck pain for greater than one month. Describes the pain as spasming in nature. Granddaughter states that today she was generally weak and more confused than normal. She is unable to care for herself. Patient continues to have neck pain that occurs episodically. No known trauma. Seen by her primary physician and advised to come to the emergency department for evaluation. Patient denies any other pain at this time. She has ongoing cough which is no worse than normal. She gets frequent UTIs but denies any urinary symptoms. Past Medical History  Diagnosis Date  . Diabetes type 2, uncontrolled (HCC)   . Non-occlusive coronary artery disease     40% first diagonal stenosis, 50% obtuse marginal stenosis  . Hypertension   . Stroke (HCC) in 05/2012    deep right frontal perventricular deep white matter infarct + subacute left thalamic lacunar infarct  . Hyperlipidemia   . Hypothyroidism   . Osteoporosis   . Hearing loss     history of right sided cholesteotoma s/p Right tympanoplasty with ossicular reconstruction. (03/2002)  . Urinary incontinence     s/p surgeyr for ureterocele  . CHF (congestive heart failure) (HCC)   . UTI (lower urinary tract infection) 09/2015   Past Surgical History  Procedure Laterality Date  . Abdominal hysterectomy    . Cesarean section    . Bladder repair    . Inner ear surgery    . Tonsillectomy and adenoidectomy     Family History  Problem Relation Age of Onset  . Heart disease Father    Social History  Substance Use Topics  . Smoking status: Never Smoker   . Smokeless tobacco: Never Used  . Alcohol Use: No   OB History    No data available     Review  of Systems  Constitutional: Positive for fever, activity change and fatigue.  Respiratory: Positive for cough and shortness of breath.   Cardiovascular: Negative for chest pain, palpitations and leg swelling.  Gastrointestinal: Negative for nausea, vomiting, abdominal pain and diarrhea.  Genitourinary: Negative for dysuria, frequency, hematuria and flank pain.  Musculoskeletal: Positive for myalgias, gait problem, neck pain and neck stiffness. Negative for back pain.  Skin: Negative for rash and wound.  Neurological: Positive for weakness (generalized). Negative for dizziness, light-headedness, numbness and headaches.  All other systems reviewed and are negative.     Allergies  Sulfa antibiotics  Home Medications   Prior to Admission medications   Medication Sig Start Date End Date Taking? Authorizing Provider  amLODipine (NORVASC) 5 MG tablet TAKE ONE TABLET BY MOUTH ONCE DAILY MUST  BE  SEEN  FOR  ADDITIONAL  REFILLS 04/04/16  Yes Mary-Margaret Daphine DeutscherMartin, FNP  aspirin 81 MG tablet Take 81 mg by mouth daily.   Yes Historical Provider, MD  carvedilol (COREG) 25 MG tablet TAKE ONE TABLET BY MOUTH TWICE DAILY WITH MEALS MUST  BE  SEEN  FOR  ADDITIONAL  REFILLS 04/04/16  Yes Mary-Margaret Daphine DeutscherMartin, FNP  citalopram (CELEXA) 20 MG tablet TAKE ONE TABLET BY MOUTH ONCE DAILY MUST  BE  SEEN  FOR  ADDITIONAL  REFILLS 04/04/16  Yes Mary-Margaret Daphine DeutscherMartin, FNP  fenofibrate (TRICOR) 145 MG tablet Take 1 tablet (145 mg total)  by mouth daily. 04/04/16  Yes Mary-Margaret Daphine Deutscher, FNP  furosemide (LASIX) 40 MG tablet Take 0.5 tablets (20 mg total) by mouth daily. 04/04/16  Yes Mary-Margaret Daphine Deutscher, FNP  insulin glargine (LANTUS) 100 UNIT/ML injection INJECT 24 UNITS SUBCUTANEOUSLY AT BEDTIME 04/04/16  Yes Mary-Margaret Daphine Deutscher, FNP  levothyroxine (SYNTHROID, LEVOTHROID) 75 MCG tablet Take 1 tablet (75 mcg total) by mouth daily. 04/04/16  Yes Mary-Margaret Daphine Deutscher, FNP  lisinopril (PRINIVIL,ZESTRIL) 20 MG tablet Take 1  tablet (20 mg total) by mouth daily. 04/04/16  Yes Mary-Margaret Daphine Deutscher, FNP  memantine (NAMENDA) 10 MG tablet Take 1 tablet (10 mg total) by mouth 2 (two) times daily. 04/04/16  Yes Mary-Margaret Daphine Deutscher, FNP  omeprazole (PRILOSEC) 40 MG capsule Take 1 capsule (40 mg total) by mouth daily. 04/04/16  Yes Mary-Margaret Daphine Deutscher, FNP  potassium chloride (K-DUR) 10 MEQ tablet Take 2 tablets (20 mEq total) by mouth daily. 04/04/16  Yes Mary-Margaret Daphine Deutscher, FNP  Saxagliptin-Metformin (KOMBIGLYZE XR) 2.03-999 MG TB24 Take 1 tablet by mouth 2 (two) times daily. 04/04/16  Yes Mary-Margaret Daphine Deutscher, FNP  glucose blood (ONETOUCH VERIO) test strip Test Tid. Dx E10.9 01/07/16   Mary-Margaret Daphine Deutscher, FNP  insulin aspart (NOVOLOG) 100 UNIT/ML injection Inject 0-15 Units into the skin 3 (three) times daily with meals. 06/03/12 06/03/13  Emory Nani Skillern, MD  loratadine-pseudoephedrine (CLARITIN-D 24 HOUR) 10-240 MG 24 hr tablet Take 1 tablet by mouth daily. 09/21/15   Vassie Loll, MD  solifenacin (VESICARE) 10 MG tablet Take 1 tablet (10 mg total) by mouth daily. 04/04/16   Mary-Margaret Daphine Deutscher, FNP   BP 193/64 mmHg  Pulse 85  Temp(Src) 99.9 F (37.7 C) (Oral)  Resp 18  Ht 5' (1.524 m)  Wt 136 lb (61.689 kg)  BMI 26.56 kg/m2  SpO2 97%  LMP 02/04/1963 Physical Exam  Constitutional: She is oriented to person, place, and time. She appears well-developed and well-nourished. No distress.  Patient is well-appearing. She periodically grabs her neck on both sides and holds it. She is hard of hearing and mildly confused. Patient smells of strong urine  HENT:  Head: Normocephalic and atraumatic.  Mouth/Throat: Oropharynx is clear and moist. No oropharyngeal exudate.  Eyes: EOM are normal. Pupils are equal, round, and reactive to light.  Neck: Normal range of motion. Neck supple.  Diffuse tenderness to the posterior neck specifically in the paraspinal and trapezius muscles. Decreased mobility.  Cardiovascular: Normal rate and  regular rhythm.  Exam reveals no gallop and no friction rub.   No murmur heard. Pulmonary/Chest: Effort normal. No respiratory distress. She has no wheezes. She has rales. She exhibits no tenderness.  Crackles in the left base  Abdominal: Soft. Bowel sounds are normal. She exhibits no distension and no mass. There is no tenderness. There is no rebound and no guarding.  Musculoskeletal: Normal range of motion. She exhibits no edema or tenderness.  No midline thoracic or lumbar tenderness. No lower extremity swelling or tenderness. Distal pulses are equal and intact.  Lymphadenopathy:    She has no cervical adenopathy.  Neurological: She is alert and oriented to person, place, and time.  5/5 motor in all extremities. Sensation is fully intact.  Skin: Skin is warm and dry. No rash noted. No erythema.  Psychiatric: She has a normal mood and affect. Her behavior is normal.  Nursing note and vitals reviewed.   ED Course  Procedures (including critical care time) Labs Review Labs Reviewed  CBC WITH DIFFERENTIAL/PLATELET - Abnormal; Notable for the following:    Hemoglobin  11.8 (*)    HCT 35.4 (*)    Platelets 149 (*)    All other components within normal limits  COMPREHENSIVE METABOLIC PANEL - Abnormal; Notable for the following:    Potassium 3.3 (*)    Chloride 98 (*)    Glucose, Bld 180 (*)    Creatinine, Ser 1.22 (*)    Alkaline Phosphatase 35 (*)    GFR calc non Af Amer 40 (*)    GFR calc Af Amer 46 (*)    All other components within normal limits  URINALYSIS, ROUTINE W REFLEX MICROSCOPIC (NOT AT [facility]) - Abnormal; Notable for the following:    Hgb urine dipstick TRACE (*)    All other components within normal limits  URINE MICROSCOPIC-ADD ON - Abnormal; Notable for the following:    Squamous Epithelial / LPF 0-5 (*)    Bacteria, UA RARE (*)    All other components within normal limits  CULTURE, BLOOD (ROUTINE X 2)  CULTURE, BLOOD (ROUTINE X 2)  URINE CULTURE  I-STAT CG4  LACTIC ACID, ED    Imaging Review Dg Chest 2 View  05/29/2016  CLINICAL DATA:  Cough, fever with neck and head pain today. EXAM: CHEST  2 VIEW COMPARISON:  PA and lateral chest 09/19/2015, 09/17/2015 and 02/17/2014. FINDINGS: The patient is rotated on the PA view. There is cardiomegaly without edema. Lungs are clear. No pneumothorax or pleural effusion. IMPRESSION: Cardiomegaly without acute disease. Electronically Signed   By: Drusilla Kanner M.D.   On: 05/29/2016 21:05   Ct Head Wo Contrast  05/29/2016  CLINICAL DATA:  Initial evaluation for acute generalized weakness, neck pain for 1 month. EXAM: CT HEAD WITHOUT CONTRAST TECHNIQUE: Contiguous axial images were obtained from the base of the skull through the vertex without intravenous contrast. COMPARISON:  Prior MRI from 09/18/2015. FINDINGS: Age-related cerebral atrophy with moderate chronic microvascular ischemic disease. The remote lacunar infarct within the left thalamus. No acute intracranial hemorrhage. No acute large vessel territory infarct. No mass lesion, midline shift or mass effect. Ventricular prominence related to global parenchymal volume loss without hydrocephalus. No extra-axial fluid collection. Scalp soft tissues demonstrate no acute abnormality. Globes and orbits within normal limits. Moderate polypoid mucosal thickening within the left maxillary sinus. Scattered mucosal thickening and opacity within the anterior ethmoidal air cells. Right frontal sinus is opacified. Remote postsurgical changes with opacity at the right mastoid air cells and middle ear cavity, likely chronic. Left mastoid air cells clear. Calvarium intact. IMPRESSION: 1. No acute intracranial process. 2. Remote lacunar infarct within the left thalamus. 3. Moderate cerebral atrophy with chronic microvascular ischemic disease, stable. 4. Moderate inflammatory paranasal sinus disease involving the left maxillary and right frontal sinuses as well as the anterior ethmoidal  air cells. Electronically Signed   By: Rise Mu M.D.   On: 05/29/2016 21:52   Mr Cervical Spine Wo Contrast  05/29/2016  CLINICAL DATA:  Neck pain for 1 week.  Dementia EXAM: MRI CERVICAL SPINE WITHOUT CONTRAST TECHNIQUE: Multiplanar, multisequence MR imaging of the cervical spine was performed. No intravenous contrast was administered. COMPARISON:  None. FINDINGS: Examination is severely degraded by motion. Alignment: Grade 1 anterolisthesis at C5-6 and C6-7. Vertebrae: No acute compression fracture, facet edema or focal marrow lesion. Cord: Normal caliber Posterior Fossa, vertebral arteries, paraspinal tissues: Visualized posterior fossa is normal. Vertebral artery flow voids are preserved. Normal visualized paraspinal soft tissues. Disc levels: C1-C2: Normal. C2-C3: Left-sided uncovertebral hypertrophy No spinal canal stenosis. Mild left neuroforaminal stenosis. C3-C4:  Small left central disc osteophyte complex and left-greater-than-right facet hypertrophy. No spinal canal stenosis. Severe left neuroforaminal stenosis. C4-C5: Intervertebral disc space narrowing with left-greater-than-right facet hypertrophy and left eccentric disc osteophyte complex. No spinal canal stenosis. Mild right and moderate left neuroforaminal stenosis. C5-C6: Grade 1 anterolisthesis with bilateral mild uncovertebral and facet hypertrophy. No spinal canal stenosis. No compressive neuroforaminal stenosis. C6-C7: Grade 1 anterolisthesis with moderate disc osteophyte complex effacing the ventral subarachnoid space. Mild spinal canal stenosis. No compressive neuroforaminal stenosis. C7-T1: Normal disc space and facet joints. No spinal canal stenosis. No neuroforaminal stenosis. IMPRESSION: 1. Severely motion degraded examination. 2. Mild spinal canal stenosis C6-7 due to the presence of disc osteophyte complex. 3. Moderate to severe neural foraminal stenosis at left C3-4 and left C4-5 due to combination of facet and  uncovertebral hypertrophy. Electronically Signed   By: Deatra Robinson M.D.   On: 05/29/2016 18:43   I have personally reviewed and evaluated these images and lab results as part of my medical decision-making.   EKG Interpretation   Date/Time:  Monday May 29 2016 17:49:26 EDT Ventricular Rate:  86 PR Interval:    QRS Duration: 97 QT Interval:  372 QTC Calculation: 445 R Axis:   39 Text Interpretation:  Sinus rhythm Borderline T abnormalities, inferior  leads Confirmed by Jencarlo Bonadonna  MD, Khaidyn Staebell ([id number]) on 05/29/2016 7:13:13 PM      MDM   Final diagnoses:  Neck pain  Fever, unspecified fever cause  Acute maxillary sinusitis, recurrence not specified    Though meningitis is certainly on the differential, her neck pain appears to be chronic in nature. We'll screen for other sources of infection. High suspicion for urinary tract infection. Patient's never had MRI of her cervical spine. We'll try to obtain that today.  Discuss results with the patient's granddaughter who is her caretaker. No source for initial low-grade fever. Patient's MRI with mild spinal canal stenosis and severe neural foraminal stenosis. Think this is likely the cause for her pain. However cannot completely rule out the possibility of meningitis. Given low grade fever and normal white blood cell count have very low suspicion for bacterial meningitis. Granddaughter agrees in not doing the lumbar puncture in the emergency department. If symptoms persist or worsen lumbar puncture can be done under fluoroscopy guidance by radiology tomorrow.  Evidence of likely Sinusitis on CT. No other acute findings. Discussed with Dr. Kevan Ny. Agrees to admit to observation.  Loren Racer, MD 05/29/16 2221

## 2016-05-29 NOTE — H&P (Signed)
History and Physical    Jamie Cochran:[id number] DOB: 1930/03/25 DOA: 05/29/2016  PCP: Bennie Pierini, FNP Consultants:  Ophthalmology Patient coming from: home, lives with granddaughter  Chief Complaint: neck pain, fatigue  HPI: Jamie Cochran is a 82 y.o. female with medical history significant of blindness and hard of hearing as well as dementia presenting with 1-2 months of neck pain.  Saw PCP and NP said it was due to her pillow.  Was intermittent and then progressed to most days.  Last night, it took granddaughter 3 hours to get her from bed-bath-kitchen table.  Now with significantly less independence, unable to complete IADLs.  She also c/o weakness.  More wobbly than usual.  Weakness started last night and worsened today.  No fever.  Slight rhinorrhea.  Has chronic cough - was on inhaler in the past (Advair and then another one - none recently).  Nonproductive cough.  Occasional abdominal pain that radiates up into chest - PCP describes it as "gas pains".  Occasional diarrhea.  Occasional urinary incontinence, not always aware.  +dementia.  Blind in one eye and almost in the other.  Doesn't hear well either.    ED Course: neck pain, chronic in nature.  MRI abnormal.  Mildly increased temp so meningitis considered but unlikely.  Sinusitis on CT, minimal symptoms.  Review of Systems: As per HPI; otherwise 10 point review of systems reviewed and negative.   Ambulatory Status: requires assisance  Past Medical History  Diagnosis Date  . Diabetes type 2, uncontrolled (HCC)   . Non-occlusive coronary artery disease     40% first diagonal stenosis, 50% obtuse marginal stenosis  . Hypertension   . Stroke (HCC) in 05/2012, ?    deep right frontal perventricular deep white matter infarct + subacute left thalamic lacunar infarct  . Hyperlipidemia   . Hypothyroidism   . Osteoporosis   . Hearing loss     history of right sided cholesteotoma s/p Right tympanoplasty  with ossicular reconstruction. (03/2002)  . Urinary incontinence     s/p surgery for ureterocele  . CHF (congestive heart failure) (HCC)   . UTI (lower urinary tract infection) 09/2015    granddaughter reports "constant UTIs"    Past Surgical History  Procedure Laterality Date  . Abdominal hysterectomy    . Cesarean section    . Bladder repair    . Inner ear surgery    . Tonsillectomy and adenoidectomy    . Eye surgery      Social History   Social History  . Marital Status: Widowed    Spouse Name: N/A  . Number of Children: N/A  . Years of Education: N/A   Occupational History  . retired    Social History Main Topics  . Smoking status: Never Smoker   . Smokeless tobacco: Never Used  . Alcohol Use: No  . Drug Use: No  . Sexual Activity: Not on file   Other Topics Concern  . Not on file   Social History Narrative   Lives at home alone in [place], [place]   //       [place] with ADLS:   Bathing: Yes   Dressing: Yes   Toileting: Yes   Maintaining continence: No   Grooming: Yes   Feeding: Yes   Transferring: Yes         // Independence with IADLS:    Shopping for groceries: Yes    Driving or using public transportation: N/A   Using the  telephone: Yes   Performing housework: Yes   Doing home repair: Yes   Doing laundry:  Yes   Taking medications: Yes   Handling finances: Yes                   Allergies  Allergen Reactions  . Sulfa Antibiotics Rash    Family History  Problem Relation Age of Onset  . Heart disease Father     Prior to Admission medications   Medication Sig Start Date End Date Taking? Authorizing Provider  amLODipine (NORVASC) 5 MG tablet TAKE ONE TABLET BY MOUTH ONCE DAILY MUST  BE  SEEN  FOR  ADDITIONAL  REFILLS 04/04/16  Yes Mary-Margaret Daphine Deutscher, FNP  aspirin 81 MG tablet Take 81 mg by mouth daily.   Yes Historical Provider, MD  carvedilol (COREG) 25 MG tablet TAKE ONE TABLET BY MOUTH TWICE DAILY WITH MEALS MUST  BE  SEEN  FOR   ADDITIONAL  REFILLS 04/04/16  Yes Mary-Margaret Daphine Deutscher, FNP  citalopram (CELEXA) 20 MG tablet TAKE ONE TABLET BY MOUTH ONCE DAILY MUST  BE  SEEN  FOR  ADDITIONAL  REFILLS 04/04/16  Yes Mary-Margaret Daphine Deutscher, FNP  fenofibrate (TRICOR) 145 MG tablet Take 1 tablet (145 mg total) by mouth daily. 04/04/16  Yes Mary-Margaret Daphine Deutscher, FNP  furosemide (LASIX) 40 MG tablet Take 0.5 tablets (20 mg total) by mouth daily. 04/04/16  Yes Mary-Margaret Daphine Deutscher, FNP  insulin glargine (LANTUS) 100 UNIT/ML injection INJECT 24 UNITS SUBCUTANEOUSLY AT BEDTIME 04/04/16  Yes Mary-Margaret Daphine Deutscher, FNP  levothyroxine (SYNTHROID, LEVOTHROID) 75 MCG tablet Take 1 tablet (75 mcg total) by mouth daily. 04/04/16  Yes Mary-Margaret Daphine Deutscher, FNP  lisinopril (PRINIVIL,ZESTRIL) 20 MG tablet Take 1 tablet (20 mg total) by mouth daily. 04/04/16  Yes Mary-Margaret Daphine Deutscher, FNP  memantine (NAMENDA) 10 MG tablet Take 1 tablet (10 mg total) by mouth 2 (two) times daily. 04/04/16  Yes Mary-Margaret Daphine Deutscher, FNP  omeprazole (PRILOSEC) 40 MG capsule Take 1 capsule (40 mg total) by mouth daily. 04/04/16  Yes Mary-Margaret Daphine Deutscher, FNP  potassium chloride (K-DUR) 10 MEQ tablet Take 2 tablets (20 mEq total) by mouth daily. 04/04/16  Yes Mary-Margaret Daphine Deutscher, FNP  Saxagliptin-Metformin (KOMBIGLYZE XR) 2.03-999 MG TB24 Take 1 tablet by mouth 2 (two) times daily. 04/04/16  Yes Mary-Margaret Daphine Deutscher, FNP  glucose blood (ONETOUCH VERIO) test strip Test Tid. Dx E10.9 01/07/16   Mary-Margaret Daphine Deutscher, FNP  insulin aspart (NOVOLOG) 100 UNIT/ML injection Inject 0-15 Units into the skin 3 (three) times daily with meals. 06/03/12 06/03/13  Emory Nani Skillern, MD  loratadine-pseudoephedrine (CLARITIN-D 24 HOUR) 10-240 MG 24 hr tablet Take 1 tablet by mouth daily. 09/21/15   Vassie Loll, MD  solifenacin (VESICARE) 10 MG tablet Take 1 tablet (10 mg total) by mouth daily. 04/04/16   Mary-Margaret Daphine Deutscher, FNP    Physical Exam: Filed Vitals:   05/29/16 2152 05/29/16 2200 05/29/16  2230 05/29/16 2323  BP:  156/55 153/59 186/71  Pulse:  84 84 78  Temp: 99.9 F (37.7 C)   98.4 F (36.9 C)  TempSrc: Oral   Oral  Resp:    20  Height:      Weight:    58.9 kg (129 lb 13.6 oz)  SpO2:  94% 94% 97%     General:  Appears calm and comfortable and is NAD; eyes are fixed on on area and she does not attempt to see anyone speaking to her; also very hard of hearing Eyes: normal lids, iris ENT: hard of hearing,  lips & tongue, mmm Neck:  no LAD, masses or thyromegaly Cardiovascular:  RRR, no m/r/g. No LE edema.  Respiratory:  CTA bilaterally, no w/r/r. Normal respiratory effort. Abdomen:  soft, ntnd, NABS Skin:  no rash or induration seen on limited exam Musculoskeletal:  grossly normal tone BUE/BLE, good ROM, no bony abnormality Psychiatric:  grossly normal mood and affect, speech fluent and appropriate Neurologic:  CN 2-12 grossly intact, moves all extremities in coordinated fashion, sensation intact  Labs on Admission: I have personally reviewed following labs and imaging studies  CBC:  Recent Labs Lab 05/29/16 1855  WBC 5.7  NEUTROABS 3.9  HGB 11.8*  HCT 35.4*  MCV 88.9  PLT 149*   Basic Metabolic Panel:  Recent Labs Lab 05/29/16 1855  NA 135  K 3.3*  CL 98*  CO2 31  GLUCOSE 180*  BUN 16  CREATININE 1.22*  CALCIUM 9.3   GFR: Estimated Creatinine Clearance: 28.1 mL/min (by C-G formula based on Cr of 1.22). Liver Function Tests:  Recent Labs Lab 05/29/16 1855  AST 18  ALT 14  ALKPHOS 35*  BILITOT 0.5  PROT 7.3  ALBUMIN 3.8   No results for input(s): LIPASE, AMYLASE in the last 168 hours. No results for input(s): AMMONIA in the last 168 hours. Coagulation Profile: No results for input(s): INR, PROTIME in the last 168 hours. Cardiac Enzymes: No results for input(s): CKTOTAL, CKMB, CKMBINDEX, TROPONINI in the last 168 hours. BNP (last 3 results) No results for input(s): PROBNP in the last 8760 hours. HbA1C: No results for input(s):  HGBA1C in the last 72 hours. CBG:  Recent Labs Lab 05/30/16 0001  GLUCAP 159*   Lipid Profile: No results for input(s): CHOL, HDL, LDLCALC, TRIG, CHOLHDL, LDLDIRECT in the last 72 hours. Thyroid Function Tests: No results for input(s): TSH, T4TOTAL, FREET4, T3FREE, THYROIDAB in the last 72 hours. Anemia Panel: No results for input(s): VITAMINB12, FOLATE, FERRITIN, TIBC, IRON, RETICCTPCT in the last 72 hours. Urine analysis:    Component Value Date/Time   COLORURINE YELLOW 05/29/2016 1950   APPEARANCEUR CLEAR 05/29/2016 1950   LABSPEC 1.015 05/29/2016 1950   PHURINE 6.0 05/29/2016 1950   GLUCOSEU NEGATIVE 05/29/2016 1950   HGBUR TRACE* 05/29/2016 1950   BILIRUBINUR NEGATIVE 05/29/2016 1950   BILIRUBINUR negative 01/03/2016 1451   KETONESUR NEGATIVE 05/29/2016 1950   PROTEINUR NEGATIVE 05/29/2016 1950   PROTEINUR negative 01/03/2016 1451   UROBILINOGEN negative 01/03/2016 1451   UROBILINOGEN 1.0 09/17/2015 2300   NITRITE NEGATIVE 05/29/2016 1950   NITRITE negative 01/03/2016 1451   LEUKOCYTESUR NEGATIVE 05/29/2016 1950    Creatinine Clearance: Estimated Creatinine Clearance: 28.1 mL/min (by C-G formula based on Cr of 1.22).  Sepsis Labs: (procalcitonin:4,lacticidven:4) ) Recent Results (from the past 240 hour(s))  Culture, blood (Routine X 2) w Reflex to ID Panel     Status: None (Preliminary result)   Collection Time: 05/29/16  7:02 PM  Result Value Ref Range Status   Specimen Description BLOOD LEFT ARM  Final   Special Requests BOTTLES DRAWN AEROBIC AND ANAEROBIC 6CC  Final   Culture PENDING  Incomplete   Report Status PENDING  Incomplete  Culture, blood (Routine X 2) w Reflex to ID Panel     Status: None (Preliminary result)   Collection Time: 05/29/16  7:05 PM  Result Value Ref Range Status   Specimen Description BLOOD LEFT HAND  Final   Special Requests BOTTLES DRAWN AEROBIC AND ANAEROBIC [facility]  Final   Culture PENDING  Incomplete  Report Status  PENDING  Incomplete     Radiological Exams on Admission: Dg Chest 2 View  05/29/2016  CLINICAL DATA:  Cough, fever with neck and head pain today. EXAM: CHEST  2 VIEW COMPARISON:  PA and lateral chest 09/19/2015, 09/17/2015 and 02/17/2014. FINDINGS: The patient is rotated on the PA view. There is cardiomegaly without edema. Lungs are clear. No pneumothorax or pleural effusion. IMPRESSION: Cardiomegaly without acute disease. Electronically Signed   By: Drusilla Kanner M.D.   On: 05/29/2016 21:05   Ct Head Wo Contrast  05/29/2016  CLINICAL DATA:  Initial evaluation for acute generalized weakness, neck pain for 1 month. EXAM: CT HEAD WITHOUT CONTRAST TECHNIQUE: Contiguous axial images were obtained from the base of the skull through the vertex without intravenous contrast. COMPARISON:  Prior MRI from 09/18/2015. FINDINGS: Age-related cerebral atrophy with moderate chronic microvascular ischemic disease. The remote lacunar infarct within the left thalamus. No acute intracranial hemorrhage. No acute large vessel territory infarct. No mass lesion, midline shift or mass effect. Ventricular prominence related to global parenchymal volume loss without hydrocephalus. No extra-axial fluid collection. Scalp soft tissues demonstrate no acute abnormality. Globes and orbits within normal limits. Moderate polypoid mucosal thickening within the left maxillary sinus. Scattered mucosal thickening and opacity within the anterior ethmoidal air cells. Right frontal sinus is opacified. Remote postsurgical changes with opacity at the right mastoid air cells and middle ear cavity, likely chronic. Left mastoid air cells clear. Calvarium intact. IMPRESSION: 1. No acute intracranial process. 2. Remote lacunar infarct within the left thalamus. 3. Moderate cerebral atrophy with chronic microvascular ischemic disease, stable. 4. Moderate inflammatory paranasal sinus disease involving the left maxillary and right frontal sinuses as well as  the anterior ethmoidal air cells. Electronically Signed   By: Rise Mu M.D.   On: 05/29/2016 21:52   Mr Cervical Spine Wo Contrast  05/29/2016  CLINICAL DATA:  Neck pain for 1 week.  Dementia EXAM: MRI CERVICAL SPINE WITHOUT CONTRAST TECHNIQUE: Multiplanar, multisequence MR imaging of the cervical spine was performed. No intravenous contrast was administered. COMPARISON:  None. FINDINGS: Examination is severely degraded by motion. Alignment: Grade 1 anterolisthesis at C5-6 and C6-7. Vertebrae: No acute compression fracture, facet edema or focal marrow lesion. Cord: Normal caliber Posterior Fossa, vertebral arteries, paraspinal tissues: Visualized posterior fossa is normal. Vertebral artery flow voids are preserved. Normal visualized paraspinal soft tissues. Disc levels: C1-C2: Normal. C2-C3: Left-sided uncovertebral hypertrophy No spinal canal stenosis. Mild left neuroforaminal stenosis. C3-C4: Small left central disc osteophyte complex and left-greater-than-right facet hypertrophy. No spinal canal stenosis. Severe left neuroforaminal stenosis. C4-C5: Intervertebral disc space narrowing with left-greater-than-right facet hypertrophy and left eccentric disc osteophyte complex. No spinal canal stenosis. Mild right and moderate left neuroforaminal stenosis. C5-C6: Grade 1 anterolisthesis with bilateral mild uncovertebral and facet hypertrophy. No spinal canal stenosis. No compressive neuroforaminal stenosis. C6-C7: Grade 1 anterolisthesis with moderate disc osteophyte complex effacing the ventral subarachnoid space. Mild spinal canal stenosis. No compressive neuroforaminal stenosis. C7-T1: Normal disc space and facet joints. No spinal canal stenosis. No neuroforaminal stenosis. IMPRESSION: 1. Severely motion degraded examination. 2. Mild spinal canal stenosis C6-7 due to the presence of disc osteophyte complex. 3. Moderate to severe neural foraminal stenosis at left C3-4 and left C4-5 due to combination  of facet and uncovertebral hypertrophy. Electronically Signed   By: Deatra Robinson M.D.   On: 05/29/2016 18:43    EKG: Independently reviewed.  NSR with rate 87; nonspecific ST changes with no evidence of acute  ischemia  Assessment/Plan Principal Problem:   Cervical neck pain with evidence of disc disease Active Problems:   Hypertension   Hypothyroidism   Diabetes mellitus type 2, controlled (HCC)   Diastolic dysfunction   Hypokalemia   Fever   Weakness generalized   Dementia   AKI (acute kidney injury) (HCC)    Neck pain -MRI definitely appears to show why patient has chronic neck pain -This could be an outpatient neurosurgery consult, but it would be important to try to get patient an appointment soon given severity of pain -Very atypical presentation if meningitis - agree with ER plan to defer LP unless patient shows clinical deterioration -If LP needed, would likely need under fluoroscopy  Fever -Most likely mild viral syndrome -CXR negative -UA unremarkable -Normal WBC count -Single barely elevated temperature -Sinusitis on CT, but minimal symptoms -For now, no antibiotics and admit to observation status and follow  HTN -Continue home Norvasc, Coreg, Lisinopril  Hypothyroidism -Check TSH -Continue home dose of Synthroid for now  DM -Decrease Lantus by 50% due to borderline low glucose levels recently -Glycemic goal would be to avoid hypo/hypergylcemia; it is unlikely that tight control would prevent long-term complications in this patient -Hold oral medication -Cover with SSI  CHF -Last Echo appears to be in Feb 2016 -EF 60-65%, grade 1 diastolic dysfunction -Hold Lasix for now  Hypokalemia -Replete and follow  Dementia -Continue Namenda and Celexa  AKI -Baseline creatinine is 0.9 -Current 1.22 -Will provide gentle hydration and recheck      DVT prophylaxis: Lovenox Code Status: DNR - confirmed with family Family Communication: Granddaughter  present at the bedside throughout; daughter/HCPOA contacted by phone Olegario Messier- Kathy, [phone] Disposition Plan:  Home once clinically improved Consults called: None Admission status: Observation, Med-Surg   Jonah BlueJennifer Randall Rampersad MD [facility]  If 7PM-7AM, please contact night-coverage [email or web address] Password [email or web address]  05/30/2016, 2:49 AM

## 2016-05-29 NOTE — ED Notes (Addendum)
Patient complaining of neck pain "for over a month." Family member states she was seen by PCP and told "she needs to get adjusted to her pillow." States pain persists. Patient has oral temp of 100.4 in triage and states "I can't move my neck at all."

## 2016-05-30 DIAGNOSIS — F0390 Unspecified dementia without behavioral disturbance: Secondary | ICD-10-CM | POA: Diagnosis present

## 2016-05-30 DIAGNOSIS — M5090 Cervical disc disorder, unspecified, unspecified cervical region: Secondary | ICD-10-CM | POA: Diagnosis not present

## 2016-05-30 DIAGNOSIS — N179 Acute kidney failure, unspecified: Secondary | ICD-10-CM | POA: Diagnosis present

## 2016-05-30 DIAGNOSIS — R531 Weakness: Secondary | ICD-10-CM

## 2016-05-30 LAB — CBC
HEMATOCRIT: 31.8 % — AB (ref 36.0–46.0)
Hemoglobin: 10.5 g/dL — ABNORMAL LOW (ref 12.0–15.0)
MCH: 29.6 pg (ref 26.0–34.0)
MCHC: 33 g/dL (ref 30.0–36.0)
MCV: 89.6 fL (ref 78.0–100.0)
Platelets: 149 10*3/uL — ABNORMAL LOW (ref 150–400)
RBC: 3.55 MIL/uL — AB (ref 3.87–5.11)
RDW: 13.3 % (ref 11.5–15.5)
WBC: 6.7 10*3/uL (ref 4.0–10.5)

## 2016-05-30 LAB — BASIC METABOLIC PANEL
ANION GAP: 6 (ref 5–15)
BUN: 16 mg/dL (ref 6–20)
CO2: 33 mmol/L — ABNORMAL HIGH (ref 22–32)
Calcium: 8.7 mg/dL — ABNORMAL LOW (ref 8.9–10.3)
Chloride: 100 mmol/L — ABNORMAL LOW (ref 101–111)
Creatinine, Ser: 0.92 mg/dL (ref 0.44–1.00)
GFR, EST NON AFRICAN AMERICAN: 56 mL/min — AB (ref >60)
Glucose, Bld: 108 mg/dL — ABNORMAL HIGH (ref 65–99)
POTASSIUM: 2.9 mmol/L — AB (ref 3.5–5.1)
SODIUM: 139 mmol/L (ref 135–145)

## 2016-05-30 LAB — GLUCOSE, CAPILLARY
GLUCOSE-CAPILLARY: 149 mg/dL — AB (ref 65–99)
Glucose-Capillary: 117 mg/dL — ABNORMAL HIGH (ref 65–99)
Glucose-Capillary: 159 mg/dL — ABNORMAL HIGH (ref 65–99)

## 2016-05-30 LAB — TSH: TSH: 1.009 u[IU]/mL (ref 0.350–4.500)

## 2016-05-30 MED ORDER — ENOXAPARIN SODIUM 40 MG/0.4ML ~~LOC~~ SOLN: 40.0000 mg | SUBCUTANEOUS | Status: DC

## 2016-05-30 MED ORDER — POTASSIUM CHLORIDE CRYS ER 20 MEQ PO TBCR
40.0000 meq | EXTENDED_RELEASE_TABLET | Freq: Once | ORAL | Status: AC
  Administered 2016-05-30: 40 meq via ORAL
  Filled 2016-05-30: qty 2

## 2016-05-30 MED ORDER — LORATADINE 10 MG PO TABS
10.0000 mg | ORAL_TABLET | Freq: Every day | ORAL | Status: DC
  Administered 2016-05-30: 10 mg via ORAL
  Filled 2016-05-30: qty 1

## 2016-05-30 MED ORDER — PSEUDOEPHEDRINE HCL ER 120 MG PO TB12
120.0000 mg | ORAL_TABLET | Freq: Every day | ORAL | Status: DC
  Administered 2016-05-30: 120 mg via ORAL
  Filled 2016-05-30 (×2): qty 1

## 2016-05-30 NOTE — Care Management Note (Signed)
Case Management Note  Patient Details  Name: Jamie Cochran MRN: [id number] Date of Birth: [birth year]  Subjective/Objective:  Patient from home, granddaughter lives with her. She has a PCP and [employer]. No CM needs.                  Action/Plan: Patient will need HHPT, offered choice. She has elected to use [facility]. Alroy BailiffLinda Lothian of [facility] notified and will obtain information from chart. Patient aware [facility] has 48 hours to make first visit.    Expected Discharge Date:                 05/30/2016 Expected Discharge Plan:  Home w Home Health Services  In-House Referral:  NA  Discharge planning Services  CM Consult  Post Acute Care Choice:  NA Choice offered to:  NA  DME Arranged:    DME Agency:     HH Arranged:  PT HH Agency:  [facility]  Status of Service:  In process, will continue to follow  If discussed at Long Length of Stay Meetings, dates discussed:    Additional Comments:  Alanni Vader, Chrystine OilerSharley Diane, RN 05/30/2016, 1:01 PM

## 2016-05-30 NOTE — Evaluation (Signed)
Physical Therapy Evaluation Patient Details Name: Jamie Cochran D [place] MRN: [id number] DOB: 1930/06/13 Today's Date: 05/30/2016   History of Present Illness  82 yo F admitted 05/29/2016 with c/o neck pain and fatigue. MRI with spinal canal stenosis C6-7 due to presence of disc osteophyte complex, moderate to severe neural foraminal stenosis at L C3-4 and L C4-5 due to combination of facet and uncovertebral hypertrophy.  PMH: blindness, HOH, dementia, DM2, CAD, HTN, CVA, hypothyroidism, osteoporosis, CHF, UTI, inner ear surgery.   Clinical Impression  Pt received in bed, and was agreeable to PT evaluation.  Pt states that she is normally independent with ambulation at home and does not use DME.  No family present to verify.  Today, pt required Mod A for supine<>sit, Min A for sit<>stand and Min A for stand pivot transfer bed<>chair.  All mobility was limited due to intense neck pain.  Pt educated on pain relief techniques with deep breathing, and relaxation.  Pt would benefit from continued HHPT upon d/c to ensure safety with mobility at home.     Follow Up Recommendations Home health PT    Equipment Recommendations  None recommended by PT    Recommendations for Other Services       Precautions / Restrictions Precautions Precautions: None Restrictions Weight Bearing Restrictions: No      Mobility  Bed Mobility Overal bed mobility: Needs Assistance Bed Mobility: Supine to Sit     Supine to sit: Mod assist     General bed mobility comments: HOB raised, and bed pad used to cradle pt's trunk and assist with trunk support during transfer supine<>sit.   Transfers Overall transfer level: Needs assistance Equipment used: 1 person hand held assist Transfers: Stand Pivot Transfers   Stand pivot transfers: Min assist       General transfer comment: vc's for pt to keep her hands down at her side - pt wants to hold both sides of her neck  Ambulation/Gait Ambulation/Gait  assistance:  (NT due to pain in neck.)              Stairs            Wheelchair Mobility    Modified Rankin (Stroke Patients Only)       Balance Overall balance assessment: Needs assistance Sitting-balance support: No upper extremity supported       Standing balance support: Single extremity supported Standing balance-Leahy Scale: Fair                               Pertinent Vitals/Pain Pain Assessment: 0-10 Pain Score: 9  Pain Location: neck - with movement, but calms to a 6-7 when she is still.  Pain Descriptors / Indicators: Sharp Pain Intervention(s): Limited activity within patient's tolerance;Repositioned    Home Living   Living Arrangements: Other relatives (granddaughter and her boyfriend. ) Available Help at Discharge: Family;Available 24 hours/day Type of Home: House Home Access: Stairs to enter Entrance Stairs-Rails: Right Entrance Stairs-Number of Steps: 2-3 Home Layout: One level Home Equipment: Walker - 2 wheels;Bedside commode;Wheelchair - manual      Prior Function     Gait / Transfers Assistance Needed: Pt states that she is independent with ambulation.      Comments: No family present to confirm PLOF.      Hand Dominance   Dominant Hand: Right    Extremity/Trunk Assessment   Upper Extremity Assessment: Generalized weakness;RUE deficits/detail;LUE deficits/detail   RUE: Unable  to fully assess due to pain       Lower Extremity Assessment: Generalized weakness         Communication   Communication: HOH  Cognition Arousal/Alertness: Awake/alert Behavior During Therapy: WFL for tasks assessed/performed Overall Cognitive Status: Within Functional Limits for tasks assessed                      General Comments      Exercises        Assessment/Plan    PT Assessment Patient needs continued PT services  PT Diagnosis Difficulty walking;Abnormality of gait;Generalized weakness   PT Problem  List Decreased strength;Decreased activity tolerance;Decreased range of motion;Decreased balance;Decreased mobility;Decreased cognition;Decreased knowledge of use of DME;Decreased safety awareness;Decreased knowledge of precautions;Pain  PT Treatment Interventions DME instruction;Gait training;Functional mobility training;Therapeutic activities;Therapeutic exercise;Balance training;Stair training;Patient/family education   PT Goals (Current goals can be found in the Care Plan section) Acute Rehab PT Goals Patient Stated Goal: To have less pain PT Goal Formulation: With patient Time For Goal Achievement: 06/06/16 Potential to Achieve Goals: Fair    Frequency Min 4X/week   Barriers to discharge        Co-evaluation               End of Session Equipment Utilized During Treatment: Gait belt Activity Tolerance: Patient limited by pain Patient left: in chair;with nursing/sitter in room Nurse Communication: Mobility status    Functional Assessment Tool Used: [employer] "6-clicks"  Functional Limitation: Mobility: Walking and moving around Mobility: Walking and Moving Around Current Status [phone]): At least 60 percent but less than 80 percent impaired, limited or restricted Mobility: Walking and Moving Around Goal Status [phone]): At least 40 percent but less than 60 percent impaired, limited or restricted    Time: 1136-1203 PT Time Calculation (min) (ACUTE ONLY): 27 min   Charges:   PT Evaluation $PT Eval Low Complexity: 1 Procedure PT Treatments $Therapeutic Activity: 8-22 mins   PT G Codes:   PT G-Codes **NOT FOR INPATIENT CLASS** Functional Assessment Tool Used: [employer] "6-clicks"  Functional Limitation: Mobility: Walking and moving around Mobility: Walking and Moving Around Current Status [phone]): At least 60 percent but less than 80 percent impaired, limited or restricted Mobility: Walking and Moving Around Goal Status [phone]): At least 40  percent but less than 60 percent impaired, limited or restricted    Jamie Cochran, PT, DPT X: [phone]

## 2016-05-30 NOTE — Care Management Obs Status (Signed)
MEDICARE OBSERVATION STATUS NOTIFICATION   Patient Details  Name: Jamie Cochran MRN: [id number] Date of Birth: [birth year]   Medicare Observation Status Notification Given:  Yes    Fuller PlanWelborn, Breckyn Ticas M, RN 05/30/2016, 2:47 PM

## 2016-05-30 NOTE — Discharge Summary (Signed)
Physician Discharge Summary  Jamie Cochran ZOX:[id number] DOB: Jul 23, 1930 DOA: 05/29/2016  PCP: Bennie Pierini, FNP  Admit date: 05/29/2016 Discharge date: 05/30/2016  Time spent: 45 minutes  Recommendations for Outpatient Follow-up:  We'll be discharged home today Follow-up appointment will be scheduled with neurosurgeon for 1 week for evaluation of cervical spinal stenosis   Discharge Diagnoses:  Principal Problem:   Cervical neck pain with evidence of disc disease Active Problems:   Hypertension   Hypothyroidism   Diabetes mellitus type 2, controlled (HCC)   Diastolic dysfunction   Hypokalemia   Fever   Weakness generalized   Dementia   AKI (acute kidney injury) (HCC)   Discharge Condition: Stable and improved  Filed Weights   05/29/16 1637 05/29/16 2323  Weight: 61.689 kg (136 lb) 58.9 kg (129 lb 13.6 oz)    History of present illness:  As per Dr. Ophelia Charter on 7/17: Jamie Cochran is a 82 y.o. female with medical history significant of blindness and hard of hearing as well as dementia presenting with 1-2 months of neck pain. Saw PCP and NP said it was due to her pillow. Was intermittent and then progressed to most days. Last night, it took granddaughter 3 hours to get her from bed-bath-kitchen table. Now with significantly less independence, unable to complete IADLs.  She also c/o weakness. More wobbly than usual. Weakness started last night and worsened today. No fever. Slight rhinorrhea. Has chronic cough - was on inhaler in the past (Advair and then another one - none recently). Nonproductive cough. Occasional abdominal pain that radiates up into chest - PCP describes it as "gas pains". Occasional diarrhea. Occasional urinary incontinence, not always aware. +dementia. Blind in one eye and almost in the other. Doesn't hear well either.    ED Course: neck pain, chronic in nature. MRI abnormal. Mildly increased temp so meningitis  considered but unlikely. Sinusitis on CT, minimal symptoms.  Hospital Course:   Neck pain Due to multilevel spinal stenosis as evidenced on MRI. We'll arrange outpatient neurosurgical follow-up, see no reason for inpatient transfer for neurosurgical consultation. Unlikely meningitis given lack of typical symptoms, fever was low-grade and transient.  Hypothyroidism Continue Synthroid  Chronic diastolic CHF Compensated  Dementia Continue Namenda  Acute renal failure Resolved with IV fluids.  Hypokalemia Potassium is 2.9 on day of discharge, she has however received two 40 mEq doses prior to discharge.  Procedures:  None   Consultations:  None  Discharge Instructions  Discharge Instructions    Diet - low sodium heart healthy    Complete by:  As directed      Increase activity slowly    Complete by:  As directed             Medication List    STOP taking these medications        insulin aspart 100 UNIT/ML injection  Commonly known as:  novoLOG      TAKE these medications        amLODipine 5 MG tablet  Commonly known as:  NORVASC  TAKE ONE TABLET BY MOUTH ONCE DAILY MUST  BE  SEEN  FOR  ADDITIONAL  REFILLS     aspirin 81 MG tablet  Take 81 mg by mouth daily.     carvedilol 25 MG tablet  Commonly known as:  COREG  TAKE ONE TABLET BY MOUTH TWICE DAILY WITH MEALS MUST  BE  SEEN  FOR  ADDITIONAL  REFILLS     citalopram 20  MG tablet  Commonly known as:  CELEXA  TAKE ONE TABLET BY MOUTH ONCE DAILY MUST  BE  SEEN  FOR  ADDITIONAL  REFILLS     fenofibrate 145 MG tablet  Commonly known as:  TRICOR  Take 1 tablet (145 mg total) by mouth daily.     furosemide 40 MG tablet  Commonly known as:  LASIX  Take 0.5 tablets (20 mg total) by mouth daily.     glucose blood test strip  Commonly known as:  ONETOUCH VERIO  Test Tid. Dx E10.9     insulin glargine 100 UNIT/ML injection  Commonly known as:  LANTUS  INJECT 24 UNITS SUBCUTANEOUSLY AT BEDTIME      levothyroxine 75 MCG tablet  Commonly known as:  SYNTHROID, LEVOTHROID  Take 1 tablet (75 mcg total) by mouth daily.     lisinopril 20 MG tablet  Commonly known as:  PRINIVIL,ZESTRIL  Take 1 tablet (20 mg total) by mouth daily.     loratadine-pseudoephedrine 10-240 MG 24 hr tablet  Commonly known as:  CLARITIN-D 24 HOUR  Take 1 tablet by mouth daily.     memantine 10 MG tablet  Commonly known as:  NAMENDA  Take 1 tablet (10 mg total) by mouth 2 (two) times daily.     omeprazole 40 MG capsule  Commonly known as:  PRILOSEC  Take 1 capsule (40 mg total) by mouth daily.     potassium chloride 10 MEQ tablet  Commonly known as:  K-DUR  Take 2 tablets (20 mEq total) by mouth daily.     Saxagliptin-Metformin 2.03-999 MG Tb24  Commonly known as:  KOMBIGLYZE XR  Take 1 tablet by mouth 2 (two) times daily.     solifenacin 10 MG tablet  Commonly known as:  VESICARE  Take 1 tablet (10 mg total) by mouth daily.       Allergies  Allergen Reactions  . Sulfa Antibiotics Rash       Follow-up Information    Follow up with Loura Halt Ditty, MD. Schedule an appointment as soon as possible for a visit in 1 week.   Specialty:  Neurosurgery   Contact information:   [address] [place] [address] [phone]        The results of significant diagnostics from this hospitalization (including imaging, microbiology, ancillary and laboratory) are listed below for reference.    Significant Diagnostic Studies: Dg Chest 2 View  05/29/2016  CLINICAL DATA:  Cough, fever with neck and head pain today. EXAM: CHEST  2 VIEW COMPARISON:  PA and lateral chest 09/19/2015, 09/17/2015 and 02/17/2014. FINDINGS: The patient is rotated on the PA view. There is cardiomegaly without edema. Lungs are clear. No pneumothorax or pleural effusion. IMPRESSION: Cardiomegaly without acute disease. Electronically Signed   By: Drusilla Kanner M.D.   On: 05/29/2016 21:05   Ct Head Wo  Contrast  05/29/2016  CLINICAL DATA:  Initial evaluation for acute generalized weakness, neck pain for 1 month. EXAM: CT HEAD WITHOUT CONTRAST TECHNIQUE: Contiguous axial images were obtained from the base of the skull through the vertex without intravenous contrast. COMPARISON:  Prior MRI from 09/18/2015. FINDINGS: Age-related cerebral atrophy with moderate chronic microvascular ischemic disease. The remote lacunar infarct within the left thalamus. No acute intracranial hemorrhage. No acute large vessel territory infarct. No mass lesion, midline shift or mass effect. Ventricular prominence related to global parenchymal volume loss without hydrocephalus. No extra-axial fluid collection. Scalp soft tissues demonstrate no acute abnormality.  Globes and orbits within normal limits. Moderate polypoid mucosal thickening within the left maxillary sinus. Scattered mucosal thickening and opacity within the anterior ethmoidal air cells. Right frontal sinus is opacified. Remote postsurgical changes with opacity at the right mastoid air cells and middle ear cavity, likely chronic. Left mastoid air cells clear. Calvarium intact. IMPRESSION: 1. No acute intracranial process. 2. Remote lacunar infarct within the left thalamus. 3. Moderate cerebral atrophy with chronic microvascular ischemic disease, stable. 4. Moderate inflammatory paranasal sinus disease involving the left maxillary and right frontal sinuses as well as the anterior ethmoidal air cells. Electronically Signed   By: Rise MuBenjamin  McClintock M.D.   On: 05/29/2016 21:52   Mr Cervical Spine Wo Contrast  05/29/2016  CLINICAL DATA:  Neck pain for 1 week.  Dementia EXAM: MRI CERVICAL SPINE WITHOUT CONTRAST TECHNIQUE: Multiplanar, multisequence MR imaging of the cervical spine was performed. No intravenous contrast was administered. COMPARISON:  None. FINDINGS: Examination is severely degraded by motion. Alignment: Grade 1 anterolisthesis at C5-6 and C6-7. Vertebrae: No  acute compression fracture, facet edema or focal marrow lesion. Cord: Normal caliber Posterior Fossa, vertebral arteries, paraspinal tissues: Visualized posterior fossa is normal. Vertebral artery flow voids are preserved. Normal visualized paraspinal soft tissues. Disc levels: C1-C2: Normal. C2-C3: Left-sided uncovertebral hypertrophy No spinal canal stenosis. Mild left neuroforaminal stenosis. C3-C4: Small left central disc osteophyte complex and left-greater-than-right facet hypertrophy. No spinal canal stenosis. Severe left neuroforaminal stenosis. C4-C5: Intervertebral disc space narrowing with left-greater-than-right facet hypertrophy and left eccentric disc osteophyte complex. No spinal canal stenosis. Mild right and moderate left neuroforaminal stenosis. C5-C6: Grade 1 anterolisthesis with bilateral mild uncovertebral and facet hypertrophy. No spinal canal stenosis. No compressive neuroforaminal stenosis. C6-C7: Grade 1 anterolisthesis with moderate disc osteophyte complex effacing the ventral subarachnoid space. Mild spinal canal stenosis. No compressive neuroforaminal stenosis. C7-T1: Normal disc space and facet joints. No spinal canal stenosis. No neuroforaminal stenosis. IMPRESSION: 1. Severely motion degraded examination. 2. Mild spinal canal stenosis C6-7 due to the presence of disc osteophyte complex. 3. Moderate to severe neural foraminal stenosis at left C3-4 and left C4-5 due to combination of facet and uncovertebral hypertrophy. Electronically Signed   By: Deatra RobinsonKevin  Herman M.D.   On: 05/29/2016 18:43    Microbiology: Recent Results (from the past 240 hour(s))  Culture, blood (Routine X 2) w Reflex to ID Panel     Status: None (Preliminary result)   Collection Time: 05/29/16  7:02 PM  Result Value Ref Range Status   Specimen Description BLOOD LEFT ARM  Final   Special Requests BOTTLES DRAWN AEROBIC AND ANAEROBIC 6CC  Final   Culture NO GROWTH < 24 HOURS  Final   Report Status PENDING   Incomplete  Culture, blood (Routine X 2) w Reflex to ID Panel     Status: None (Preliminary result)   Collection Time: 05/29/16  7:05 PM  Result Value Ref Range Status   Specimen Description BLOOD LEFT HAND  Final   Special Requests BOTTLES DRAWN AEROBIC AND ANAEROBIC 6CC  Final   Culture NO GROWTH < 24 HOURS  Final   Report Status PENDING  Incomplete     Labs: Basic Metabolic Panel:  Recent Labs Lab 05/29/16 1855 05/30/16 0631  NA 135 139  K 3.3* 2.9*  CL 98* 100*  CO2 31 33*  GLUCOSE 180* 108*  BUN 16 16  CREATININE 1.22* 0.92  CALCIUM 9.3 8.7*   Liver Function Tests:  Recent Labs Lab 05/29/16 1855  AST 18  ALT 14  ALKPHOS 35*  BILITOT 0.5  PROT 7.3  ALBUMIN 3.8   No results for input(s): LIPASE, AMYLASE in the last 168 hours. No results for input(s): AMMONIA in the last 168 hours. CBC:  Recent Labs Lab 05/29/16 1855 05/30/16 0631  WBC 5.7 6.7  NEUTROABS 3.9  --   HGB 11.8* 10.5*  HCT 35.4* 31.8*  MCV 88.9 89.6  PLT 149* 149*   Cardiac Enzymes: No results for input(s): CKTOTAL, CKMB, CKMBINDEX, TROPONINI in the last 168 hours. BNP: BNP (last 3 results) No results for input(s): BNP in the last 8760 hours.  ProBNP (last 3 results) No results for input(s): PROBNP in the last 8760 hours.  CBG:  Recent Labs Lab 05/30/16 0001 05/30/16 0740 05/30/16 1117  GLUCAP 159* 117* 149*       Signed:  HERNANDEZ ACOSTA,ESTELA  [facility] Pager: [phone] 05/30/2016, 3:28 PM

## 2016-05-31 ENCOUNTER — Telehealth: Payer: Self-pay | Admitting: Nurse Practitioner

## 2016-05-31 ENCOUNTER — Other Ambulatory Visit: Payer: Self-pay | Admitting: Nurse Practitioner

## 2016-05-31 DIAGNOSIS — M542 Cervicalgia: Secondary | ICD-10-CM

## 2016-05-31 LAB — BLOOD CULTURE ID PANEL (REFLEXED)
Acinetobacter baumannii: NOT DETECTED
CANDIDA ALBICANS: NOT DETECTED
CANDIDA PARAPSILOSIS: NOT DETECTED
Candida glabrata: NOT DETECTED
Candida krusei: NOT DETECTED
Candida tropicalis: NOT DETECTED
Carbapenem resistance: NOT DETECTED
ENTEROBACTER CLOACAE COMPLEX: NOT DETECTED
ENTEROBACTERIACEAE SPECIES: NOT DETECTED
ENTEROCOCCUS SPECIES: NOT DETECTED
Escherichia coli: NOT DETECTED
Haemophilus influenzae: NOT DETECTED
KLEBSIELLA PNEUMONIAE: NOT DETECTED
Klebsiella oxytoca: NOT DETECTED
Listeria monocytogenes: NOT DETECTED
METHICILLIN RESISTANCE: NOT DETECTED
Neisseria meningitidis: NOT DETECTED
PSEUDOMONAS AERUGINOSA: NOT DETECTED
Proteus species: NOT DETECTED
STAPHYLOCOCCUS AUREUS BCID: NOT DETECTED
STREPTOCOCCUS AGALACTIAE: NOT DETECTED
STREPTOCOCCUS PNEUMONIAE: NOT DETECTED
STREPTOCOCCUS SPECIES: NOT DETECTED
Serratia marcescens: NOT DETECTED
Staphylococcus species: DETECTED — AB
Streptococcus pyogenes: NOT DETECTED
VANCOMYCIN RESISTANCE: NOT DETECTED

## 2016-05-31 LAB — HEMOGLOBIN A1C
HEMOGLOBIN A1C: 6.2 % — AB (ref 4.8–5.6)
MEAN PLASMA GLUCOSE: 131 mg/dL

## 2016-05-31 NOTE — Telephone Encounter (Signed)
Will need to be seen to get pain meds- concerned about giving pain meds with patients age- do not want her to get confused and break something Referral made to neurosurgery

## 2016-05-31 NOTE — Telephone Encounter (Signed)
Granddaughter aware

## 2016-05-31 NOTE — Telephone Encounter (Signed)
Released from [facility] yesterday Told her to call [facility] and ask for pain meds..  She needs neurosurgeon for arthritis and degeneration  (Dr Sharlet SalinaBenjamin Ditty- we assume that [facility] referred her)  and needs meds until can see him  Granddaughter called Ditty's office and left a message for appt, but hasn't heard back yet.

## 2016-06-01 ENCOUNTER — Ambulatory Visit (INDEPENDENT_AMBULATORY_CARE_PROVIDER_SITE_OTHER): Payer: Medicare Other | Admitting: Nurse Practitioner

## 2016-06-01 ENCOUNTER — Other Ambulatory Visit: Payer: Self-pay | Admitting: Nurse Practitioner

## 2016-06-01 ENCOUNTER — Encounter: Payer: Self-pay | Admitting: Nurse Practitioner

## 2016-06-01 ENCOUNTER — Telehealth: Payer: Self-pay | Admitting: *Deleted

## 2016-06-01 VITALS — BP 111/67 | HR 71 | Temp 97.5°F | Ht 60.0 in | Wt 129.0 lb

## 2016-06-01 DIAGNOSIS — M542 Cervicalgia: Secondary | ICD-10-CM

## 2016-06-01 DIAGNOSIS — R7881 Bacteremia: Secondary | ICD-10-CM

## 2016-06-01 DIAGNOSIS — Z09 Encounter for follow-up examination after completed treatment for conditions other than malignant neoplasm: Secondary | ICD-10-CM

## 2016-06-01 LAB — URINE CULTURE

## 2016-06-01 MED ORDER — MELOXICAM 15 MG PO TABS: 15.0000 mg | ORAL_TABLET | Freq: Every day | ORAL | Status: DC

## 2016-06-01 MED ORDER — FLUCONAZOLE 150 MG PO TABS: 150.0000 mg | ORAL_TABLET | Freq: Once | ORAL | Status: DC

## 2016-06-01 NOTE — Telephone Encounter (Signed)
-----   Message from Bennie PieriniMary-Margaret Martin, FNP sent at 05/31/2016  4:54 PM EDT ----- Need to repeat blood culture here ASAP

## 2016-06-01 NOTE — Progress Notes (Signed)
   Subjective:    Patient ID: Jamie Cochran, female    DOB: 1930-04-27, 82 y.o.   MRN: [id number]  HPI Patient here for hospital follow up- she was in hospital with confusion and neck pain- she had blood cultures and one came back positive for staph bu other was negative- will repeat that today. The hospital wanted her to come here for pain meds for her neck as well as referral to neurosurgeon. Her granddaughter says that she complains of her neck hurting all the time. Unable to get more information from patient due to dementia.    Review of Systems  Constitutional: Negative for fever and chills.  HENT: Negative.   Respiratory: Negative for cough and shortness of breath.   Cardiovascular: Negative for chest pain.  Gastrointestinal: Negative.   Genitourinary: Negative.   Neurological: Negative.   Psychiatric/Behavioral: Negative.   All other systems reviewed and are negative.      Objective:   Physical Exam  Constitutional: She appears well-developed and well-nourished. No distress.  Cardiovascular: Normal rate and regular rhythm.   Musculoskeletal:  Decrease ROM of neck due to pain with any movement  Neurological: She is alert.  Skin: Skin is warm.  Psychiatric: She has a normal mood and affect. Her behavior is normal. Judgment and thought content normal.   BP 111/67 mmHg  Pulse 71  Temp(Src) 97.5 F (36.4 C) (Oral)  Ht 5' (1.524 m)  Wt 129 lb (58.514 kg)  BMI 25.19 kg/m2  LMP 02/04/1963      Assessment & Plan:  1. Hospital discharge follow-up Hospital records reviewed  2. Positive blood culture Will repeat since ones from hospital were conflicting - Culture, blood (single) w Reflex to ID Panel  3. Neck pain Moist heat Icy hot Have done referral to neurosurgeon - meloxicam (MOBIC) 15 MG tablet; Take 1 tablet (15 mg total) by mouth daily.  Dispense: 30 tablet; Refill: 3   Mary-Margaret Daphine DeutscherMartin, FNP

## 2016-06-02 ENCOUNTER — Other Ambulatory Visit: Payer: Medicare Other

## 2016-06-02 ENCOUNTER — Other Ambulatory Visit: Payer: Self-pay

## 2016-06-02 DIAGNOSIS — R300 Dysuria: Secondary | ICD-10-CM

## 2016-06-02 DIAGNOSIS — N390 Urinary tract infection, site not specified: Secondary | ICD-10-CM

## 2016-06-02 DIAGNOSIS — Z0289 Encounter for other administrative examinations: Secondary | ICD-10-CM

## 2016-06-02 LAB — URINALYSIS, COMPLETE
BILIRUBIN UA: NEGATIVE
Glucose, UA: NEGATIVE
Ketones, UA: NEGATIVE
Nitrite, UA: NEGATIVE
PH UA: 5.5 (ref 5.0–7.5)
PROTEIN UA: NEGATIVE
Specific Gravity, UA: 1.02 (ref 1.005–1.030)
Urobilinogen, Ur: 1 mg/dL (ref 0.2–1.0)

## 2016-06-02 LAB — MICROSCOPIC EXAMINATION

## 2016-06-03 LAB — CULTURE, BLOOD (ROUTINE X 2): CULTURE: NO GROWTH

## 2016-06-04 LAB — URINE CULTURE

## 2016-06-05 ENCOUNTER — Other Ambulatory Visit: Payer: Self-pay | Admitting: Nurse Practitioner

## 2016-06-05 MED ORDER — CIPROFLOXACIN HCL 500 MG PO TABS: 500.0000 mg | ORAL_TABLET | Freq: Two times a day (BID) | ORAL | 0 refills | Status: DC

## 2016-06-09 LAB — CULTURE, BLOOD (SINGLE)

## 2016-06-27 ENCOUNTER — Other Ambulatory Visit: Payer: Self-pay | Admitting: Nurse Practitioner

## 2016-06-29 ENCOUNTER — Telehealth: Payer: Self-pay | Admitting: Nurse Practitioner

## 2016-06-29 NOTE — Telephone Encounter (Signed)
Pharmacy notified ok to switch

## 2016-06-29 NOTE — Telephone Encounter (Signed)
That is fine to switch her

## 2016-07-06 ENCOUNTER — Ambulatory Visit: Payer: Medicare Other | Admitting: Nurse Practitioner

## 2016-07-10 ENCOUNTER — Other Ambulatory Visit: Payer: Self-pay | Admitting: Nurse Practitioner

## 2016-07-13 ENCOUNTER — Ambulatory Visit (INDEPENDENT_AMBULATORY_CARE_PROVIDER_SITE_OTHER): Payer: Medicare Other | Admitting: Pediatrics

## 2016-07-13 DIAGNOSIS — I10 Essential (primary) hypertension: Secondary | ICD-10-CM | POA: Diagnosis not present

## 2016-07-13 DIAGNOSIS — I5030 Unspecified diastolic (congestive) heart failure: Secondary | ICD-10-CM | POA: Diagnosis not present

## 2016-07-13 DIAGNOSIS — E119 Type 2 diabetes mellitus without complications: Secondary | ICD-10-CM

## 2016-07-13 DIAGNOSIS — M542 Cervicalgia: Secondary | ICD-10-CM

## 2016-08-04 IMAGING — CR DG CHEST 2V
2 series · 2 of 2 positions shown · non-contrast
Comparison: 01/05/2015

CLINICAL DATA: Altered mental status.  Cough and chest congestion.

EXAM:
CHEST  2 VIEW

[chest lat]
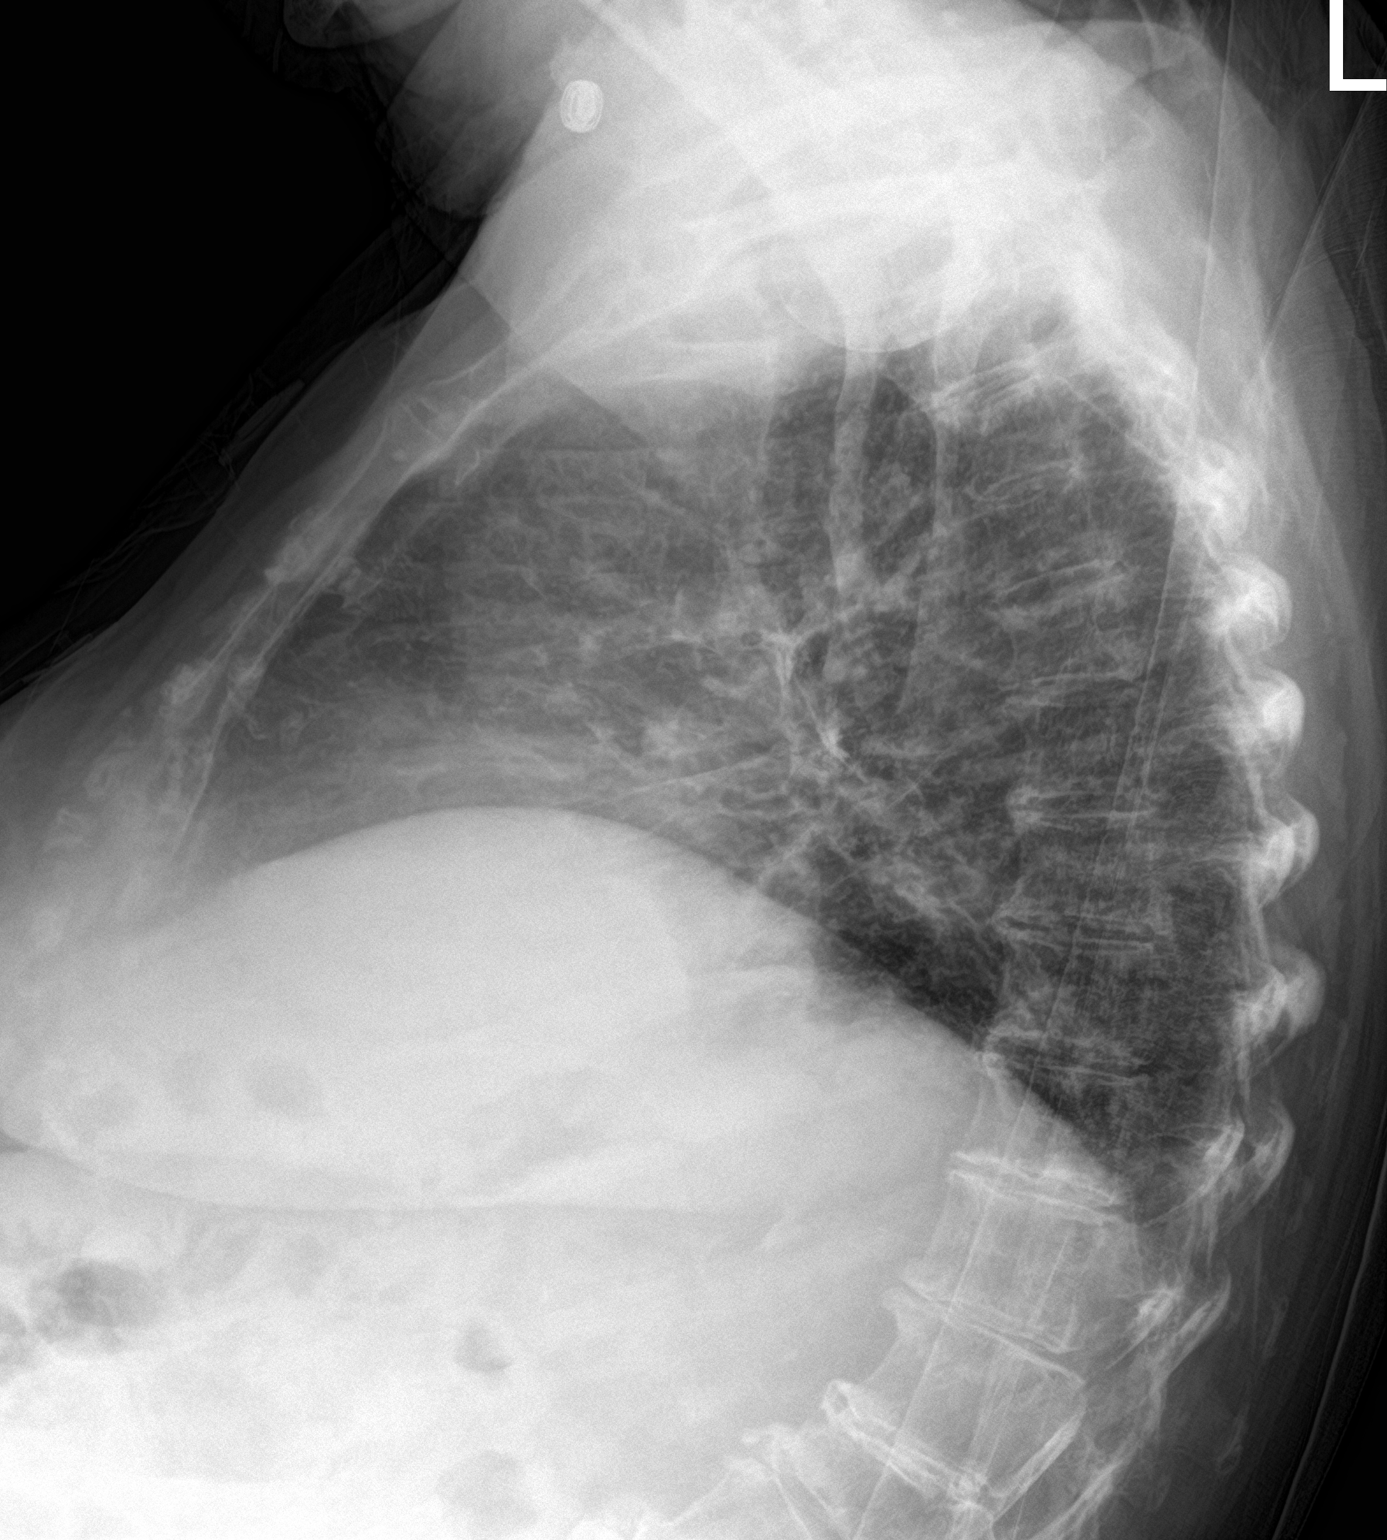

[chest ap]
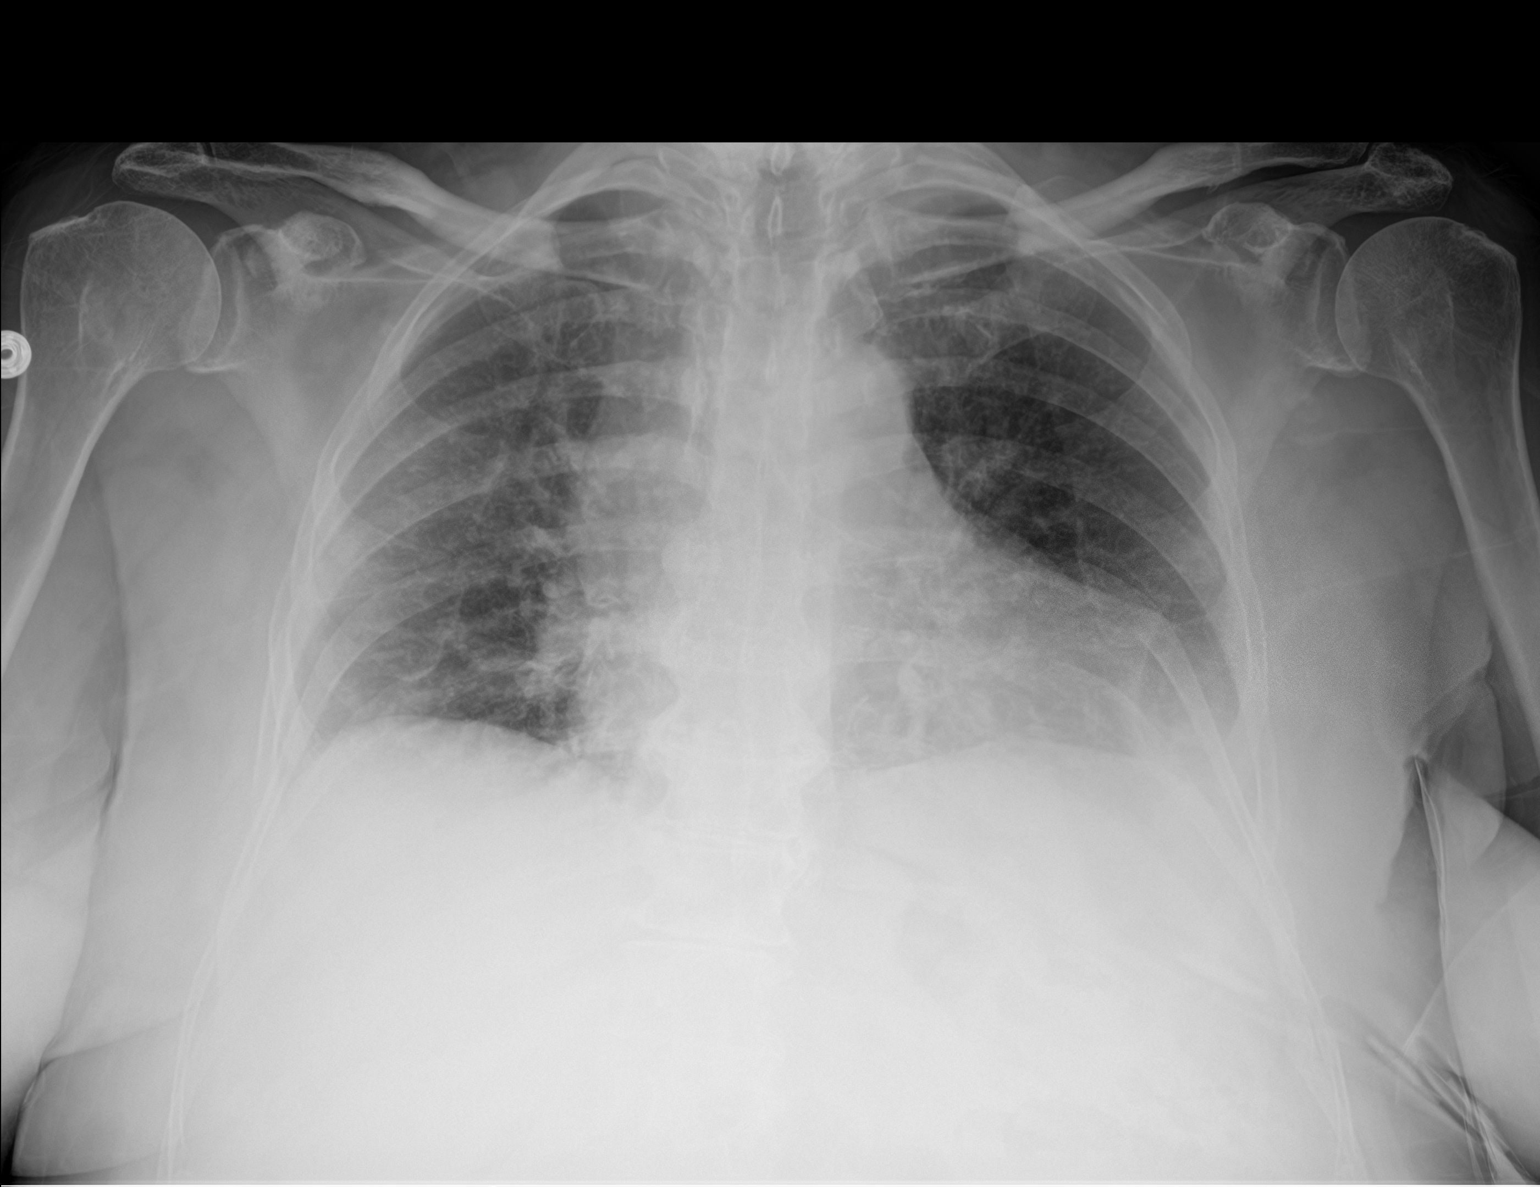

[2 of 2 positions shown; findings below may reference images not displayed]

FINDINGS: There is unchanged moderate cardiomegaly. The lungs are clear. There
is no pleural effusion. Mild chronic appearing central vascular
prominence.
IMPRESSION: Cardiomegaly and mild chronic appearing central vascular prominence.
No acute findings.

## 2016-08-06 ENCOUNTER — Other Ambulatory Visit: Payer: Self-pay | Admitting: Nurse Practitioner

## 2016-08-18 ENCOUNTER — Ambulatory Visit (INDEPENDENT_AMBULATORY_CARE_PROVIDER_SITE_OTHER): Payer: Medicare Other | Admitting: Nurse Practitioner

## 2016-08-18 ENCOUNTER — Encounter: Payer: Self-pay | Admitting: Nurse Practitioner

## 2016-08-18 VITALS — BP 153/80 | HR 70 | Temp 97.0°F | Ht 60.0 in | Wt 134.0 lb

## 2016-08-18 DIAGNOSIS — N3000 Acute cystitis without hematuria: Secondary | ICD-10-CM

## 2016-08-18 DIAGNOSIS — Z23 Encounter for immunization: Secondary | ICD-10-CM

## 2016-08-18 DIAGNOSIS — R300 Dysuria: Secondary | ICD-10-CM

## 2016-08-18 LAB — MICROSCOPIC EXAMINATION

## 2016-08-18 LAB — URINALYSIS, COMPLETE
BILIRUBIN UA: NEGATIVE
Glucose, UA: NEGATIVE
Ketones, UA: NEGATIVE
Nitrite, UA: NEGATIVE
PH UA: 5 (ref 5.0–7.5)
Protein, UA: NEGATIVE
Specific Gravity, UA: 1.01 (ref 1.005–1.030)
UUROB: 0.2 mg/dL (ref 0.2–1.0)

## 2016-08-18 MED ORDER — CIPROFLOXACIN HCL 500 MG PO TABS: 500.0000 mg | ORAL_TABLET | Freq: Two times a day (BID) | ORAL | 0 refills | Status: DC

## 2016-08-18 NOTE — Progress Notes (Signed)
ZOX:[id number]:Jamie Daphine DeutscherMartin, FNP Chief Complaint  Patient presents with  . Dysuria  . Altered Mental Status    Current Issues:  Presents with 7 days of urinary urgency, urinary frequency and and disorientation Associated symptoms include:  chills, dysuria, urinary frequency and urinary incontinence  There is a previous history of of similar symptoms. Sexually active:  No   No concern for STI.  Prior to Admission medications   Medication Sig Start Date End Date Taking? Authorizing Provider  amLODipine (NORVASC) 5 MG tablet TAKE ONE TABLET BY MOUTH ONCE DAILY MUST  BE  SEEN  FOR  ADDITIONAL  REFILLS 04/04/16   Jamie Daphine DeutscherMartin, FNP  aspirin 81 MG tablet Take 81 mg by mouth daily.    Historical Provider, MD  carvedilol (COREG) 25 MG tablet TAKE ONE TABLET BY MOUTH TWICE DAILY WITH MEALS MUST  BE  SEEN  FOR  ADDITIONAL  REFILLS 04/04/16   Jamie Daphine DeutscherMartin, FNP  citalopram (CELEXA) 20 MG tablet TAKE ONE TABLET BY MOUTH ONCE DAILY MUST  BE  SEEN  FOR  ADDITIONAL  REFILLS 04/04/16   Jamie Daphine DeutscherMartin, FNP  fenofibrate (TRICOR) 145 MG tablet Take 1 tablet (145 mg total) by mouth daily. 04/04/16   Jamie Daphine DeutscherMartin, FNP  furosemide (LASIX) 40 MG tablet Take 0.5 tablets (20 mg total) by mouth daily. 04/04/16   Jamie Daphine DeutscherMartin, FNP  insulin glargine (LANTUS) 100 UNIT/ML injection INJECT 24 UNITS SUBCUTANEOUSLY AT BEDTIME 04/04/16   Jamie Daphine DeutscherMartin, FNP  levothyroxine (SYNTHROID, LEVOTHROID) 75 MCG tablet Take 1 tablet (75 mcg total) by mouth daily. 04/04/16   Jamie Daphine DeutscherMartin, FNP  lisinopril (PRINIVIL,ZESTRIL) 20 MG tablet Take 1 tablet (20 mg total) by mouth daily. 04/04/16   Jamie Daphine DeutscherMartin, FNP  meloxicam (MOBIC) 15 MG tablet Take 1 tablet (15 mg total) by mouth daily. 06/01/16   Jamie Daphine DeutscherMartin, FNP  memantine (NAMENDA) 10 MG tablet Take 1 tablet (10 mg total) by mouth 2 (two) times daily. 04/04/16   Jamie Daphine DeutscherMartin, FNP  omeprazole (PRILOSEC) 40 MG capsule Take 1  capsule (40 mg total) by mouth daily. 04/04/16   Jamie Daphine DeutscherMartin, FNP  ONETOUCH VERIO test strip USE TO CHECK GLUCOSE THREE TIMES DAILY 07/11/16   Jamie Daphine DeutscherMartin, FNP  potassium chloride (K-DUR) 10 MEQ tablet Take 2 tablets (20 mEq total) by mouth daily. 04/04/16   Jamie Daphine DeutscherMartin, FNP  Saxagliptin-Metformin (KOMBIGLYZE XR) 2.03-999 MG TB24 Take 1 tablet by mouth 2 (two) times daily. 04/04/16   Jamie Daphine DeutscherMartin, FNP  solifenacin (VESICARE) 10 MG tablet Take 1 tablet (10 mg total) by mouth daily. 04/04/16   Jamie Daphine DeutscherMartin, FNP    Review of Systems:all negative except what noted in HPI  PE:   Constitutional- disoriented- slow to answer questions HeartRRR Lungs clear in all fields Back- FROM without pain Abdomen- no tenderness- BS normal Pelvic- no suprapubic pain on palaption    Assessment and Plan:  1. Dysuria 2. UTI Take medication as prescribe Cotton underwear Take shower not bath Cranberry juice, yogurt Force fluids AZO over the counter X2 days Culture pending RTO prn  Meds ordered this encounter  Medications  . ciprofloxacin (CIPRO) 500 MG tablet    Sig: Take 1 tablet (500 mg total) by mouth 2 (two) times daily.    Dispense:  14 tablet    Refill:  0    Order Specific Question:   Supervising Provider    Answer:   Johna SheriffVINCENT, CAROL L [[id number]]     Jamie Daphine DeutscherMartin, FNP

## 2016-08-18 NOTE — Addendum Note (Signed)
Addended by: Bennie PieriniMARTIN, MARY-MARGARET on: 08/18/2016 04:25 PM   Modules accepted: Orders

## 2016-08-18 NOTE — Patient Instructions (Signed)

## 2016-08-20 LAB — URINE CULTURE

## 2016-08-21 DIAGNOSIS — Z23 Encounter for immunization: Secondary | ICD-10-CM | POA: Diagnosis not present

## 2016-08-27 ENCOUNTER — Other Ambulatory Visit: Payer: Self-pay | Admitting: Nurse Practitioner

## 2016-08-27 MED ORDER — NITROFURANTOIN MONOHYD MACRO 100 MG PO CAPS: 100.0000 mg | ORAL_CAPSULE | Freq: Two times a day (BID) | ORAL | 0 refills | Status: DC

## 2016-09-26 ENCOUNTER — Other Ambulatory Visit: Payer: Self-pay | Admitting: Nurse Practitioner

## 2016-09-26 DIAGNOSIS — M542 Cervicalgia: Secondary | ICD-10-CM

## 2016-11-03 ENCOUNTER — Telehealth: Payer: Self-pay | Admitting: Nurse Practitioner

## 2016-11-03 NOTE — Telephone Encounter (Signed)
Needs to bring us a sample before 530 today

## 2016-11-03 NOTE — Telephone Encounter (Signed)
Pt has recurrent utis Please review and advise

## 2016-11-03 NOTE — Telephone Encounter (Signed)
Pt's grand-daughter notified will need to bring in sample Supplies to front for sample

## 2016-11-08 ENCOUNTER — Other Ambulatory Visit: Payer: Medicare Other

## 2016-11-08 DIAGNOSIS — R300 Dysuria: Secondary | ICD-10-CM

## 2016-11-08 LAB — MICROSCOPIC EXAMINATION: RBC MICROSCOPIC, UA: NONE SEEN /HPF (ref 0–?)

## 2016-11-08 LAB — URINALYSIS, COMPLETE
BILIRUBIN UA: NEGATIVE
Glucose, UA: NEGATIVE
Ketones, UA: NEGATIVE
Nitrite, UA: NEGATIVE
PH UA: 5.5 (ref 5.0–7.5)
Protein, UA: NEGATIVE
Specific Gravity, UA: 1.01 (ref 1.005–1.030)
Urobilinogen, Ur: 0.2 mg/dL (ref 0.2–1.0)

## 2016-11-10 ENCOUNTER — Other Ambulatory Visit: Payer: Self-pay | Admitting: Nurse Practitioner

## 2016-11-10 LAB — URINE CULTURE

## 2016-11-10 MED ORDER — CIPROFLOXACIN HCL 500 MG PO TABS: 500.0000 mg | ORAL_TABLET | Freq: Two times a day (BID) | ORAL | 0 refills | Status: DC

## 2016-11-10 NOTE — Progress Notes (Signed)
lmtcb

## 2016-11-10 NOTE — Progress Notes (Signed)
Granddaughter aware

## 2016-11-10 NOTE — Progress Notes (Unsigned)
cipro sent in for UTI- preliminary report showing e coli- this usually comes from poor hygiene and not wiping properly- tell graddaughtere to watch closely.

## 2016-11-12 ENCOUNTER — Other Ambulatory Visit: Payer: Self-pay | Admitting: Nurse Practitioner

## 2016-11-12 MED ORDER — DOXYCYCLINE HYCLATE 100 MG PO TABS: 100.0000 mg | ORAL_TABLET | Freq: Two times a day (BID) | ORAL | 0 refills | Status: DC

## 2016-11-17 ENCOUNTER — Encounter: Payer: Self-pay | Admitting: Nurse Practitioner

## 2016-11-17 ENCOUNTER — Ambulatory Visit (INDEPENDENT_AMBULATORY_CARE_PROVIDER_SITE_OTHER): Payer: Medicare Other | Admitting: Nurse Practitioner

## 2016-11-17 VITALS — BP 170/74 | HR 71 | Temp 97.1°F | Ht 60.0 in | Wt 132.0 lb

## 2016-11-17 DIAGNOSIS — G3183 Dementia with Lewy bodies: Secondary | ICD-10-CM | POA: Diagnosis not present

## 2016-11-17 DIAGNOSIS — E109 Type 1 diabetes mellitus without complications: Secondary | ICD-10-CM

## 2016-11-17 DIAGNOSIS — R221 Localized swelling, mass and lump, neck: Secondary | ICD-10-CM

## 2016-11-17 DIAGNOSIS — L989 Disorder of the skin and subcutaneous tissue, unspecified: Secondary | ICD-10-CM | POA: Diagnosis not present

## 2016-11-17 DIAGNOSIS — F0281 Dementia in other diseases classified elsewhere with behavioral disturbance: Secondary | ICD-10-CM

## 2016-11-17 DIAGNOSIS — F3341 Major depressive disorder, recurrent, in partial remission: Secondary | ICD-10-CM

## 2016-11-17 DIAGNOSIS — E782 Mixed hyperlipidemia: Secondary | ICD-10-CM

## 2016-11-17 DIAGNOSIS — K219 Gastro-esophageal reflux disease without esophagitis: Secondary | ICD-10-CM | POA: Diagnosis not present

## 2016-11-17 DIAGNOSIS — E876 Hypokalemia: Secondary | ICD-10-CM | POA: Diagnosis not present

## 2016-11-17 DIAGNOSIS — I10 Essential (primary) hypertension: Secondary | ICD-10-CM | POA: Diagnosis not present

## 2016-11-17 DIAGNOSIS — E785 Hyperlipidemia, unspecified: Secondary | ICD-10-CM

## 2016-11-17 DIAGNOSIS — N393 Stress incontinence (female) (male): Secondary | ICD-10-CM

## 2016-11-17 DIAGNOSIS — I5022 Chronic systolic (congestive) heart failure: Secondary | ICD-10-CM

## 2016-11-17 DIAGNOSIS — F02818 Dementia in other diseases classified elsewhere, unspecified severity, with other behavioral disturbance: Secondary | ICD-10-CM

## 2016-11-17 LAB — BAYER DCA HB A1C WAIVED: HB A1C: 5.6 % (ref <7.0)

## 2016-11-17 MED ORDER — DONEPEZIL HCL 10 MG PO TABS: 10.0000 mg | ORAL_TABLET | Freq: Every day | ORAL | 5 refills | Status: DC

## 2016-11-17 MED ORDER — SAXAGLIPTIN-METFORMIN ER 2.5-1000 MG PO TB24: 1.0000 | ORAL_TABLET | Freq: Two times a day (BID) | ORAL | 5 refills | Status: DC

## 2016-11-17 MED ORDER — LEVOTHYROXINE SODIUM 75 MCG PO TABS: 75.0000 ug | ORAL_TABLET | Freq: Every day | ORAL | 5 refills | Status: DC

## 2016-11-17 MED ORDER — CITALOPRAM HYDROBROMIDE 20 MG PO TABS: ORAL_TABLET | ORAL | 5 refills | Status: DC

## 2016-11-17 MED ORDER — POTASSIUM CHLORIDE ER 10 MEQ PO TBCR: 20.0000 meq | EXTENDED_RELEASE_TABLET | Freq: Every day | ORAL | 5 refills | Status: DC

## 2016-11-17 MED ORDER — CARVEDILOL 25 MG PO TABS: ORAL_TABLET | ORAL | 5 refills | Status: DC

## 2016-11-17 MED ORDER — LISINOPRIL 20 MG PO TABS: 20.0000 mg | ORAL_TABLET | Freq: Every day | ORAL | 5 refills | Status: DC

## 2016-11-17 MED ORDER — SOLIFENACIN SUCCINATE 10 MG PO TABS: 10.0000 mg | ORAL_TABLET | Freq: Every day | ORAL | 5 refills | Status: DC

## 2016-11-17 MED ORDER — AMLODIPINE BESYLATE 5 MG PO TABS: ORAL_TABLET | ORAL | 5 refills | Status: DC

## 2016-11-17 MED ORDER — FENOFIBRATE 145 MG PO TABS: 145.0000 mg | ORAL_TABLET | Freq: Every day | ORAL | 5 refills | Status: DC

## 2016-11-17 MED ORDER — FUROSEMIDE 40 MG PO TABS: 20.0000 mg | ORAL_TABLET | Freq: Every day | ORAL | Status: DC

## 2016-11-17 MED ORDER — MEMANTINE HCL 10 MG PO TABS: 10.0000 mg | ORAL_TABLET | Freq: Two times a day (BID) | ORAL | 5 refills | Status: DC

## 2016-11-17 MED ORDER — INSULIN GLARGINE 100 UNIT/ML ~~LOC~~ SOLN: SUBCUTANEOUS | 5 refills | Status: DC

## 2016-11-17 MED ORDER — OMEPRAZOLE 40 MG PO CPDR: 40.0000 mg | DELAYED_RELEASE_CAPSULE | Freq: Every day | ORAL | 5 refills | Status: DC

## 2016-11-17 NOTE — Progress Notes (Signed)
Subjective:    Patient ID: Jamie Cochran, female    DOB: [birth year], 83 y.o.   MRN: [id number]  Patient here today for follow up of chronic medical problems.   Outpatient Encounter Prescriptions as of 01/03/2016  Medication Sig  . amLODipine (NORVASC) 5 MG tablet TAKE ONE TABLET BY MOUTH ONCE DAILY MUST  BE  SEEN  FOR  ADDITIONAL  REFILLS  . aspirin 81 MG tablet Take 81 mg by mouth daily.  . carvedilol (COREG) 25 MG tablet TAKE ONE TABLET BY MOUTH TWICE DAILY WITH MEALS MUST  BE  SEEN  FOR  ADDITIONAL  REFILLS  . cefUROXime (CEFTIN) 500 MG tablet Take 1 tablet (500 mg total) by mouth 2 (two) times daily with a meal.  . citalopram (CELEXA) 20 MG tablet TAKE ONE TABLET BY MOUTH ONCE DAILY MUST  BE  SEEN  FOR  ADDITIONAL  REFILLS  . fenofibrate (TRICOR) 145 MG tablet Take 1 tablet (145 mg total) by mouth daily.  . fluticasone (FLONASE) 50 MCG/ACT nasal spray Place 1 spray into both nostrils 2 (two) times daily as needed for allergies or rhinitis.  . furosemide (LASIX) 40 MG tablet Take 0.5 tablets (20 mg total) by mouth daily.  Marland Kitchen guaiFENesin-dextromethorphan (ROBITUSSIN DM) 100-10 MG/5ML syrup Take 5 mLs by mouth every 4 (four) hours as needed for cough.  . insulin aspart (NOVOLOG) 100 UNIT/ML injection Inject 0-15 Units into the skin 3 (three) times daily with meals.  . insulin glargine (LANTUS) 100 UNIT/ML injection INJECT 24 UNITS SUBCUTANEOUSLY AT BEDTIME  . levothyroxine (SYNTHROID, LEVOTHROID) 75 MCG tablet Take 1 tablet (75 mcg total) by mouth daily.  Marland Kitchen lisinopril (PRINIVIL,ZESTRIL) 20 MG tablet TAKE ONE TABLET BY MOUTH ONCE DAILY  . loratadine-pseudoephedrine (CLARITIN-D 24 HOUR) 10-240 MG 24 hr tablet Take 1 tablet by mouth daily.  . memantine (NAMENDA) 10 MG tablet TAKE ONE TABLET BY MOUTH TWICE DAILY  . omeprazole (PRILOSEC) 40 MG capsule Take 1 capsule (40 mg total) by mouth daily.  . potassium chloride (K-DUR) 10 MEQ tablet Take 2 tablets (20 mEq total) by mouth daily.  .  Saxagliptin-Metformin (KOMBIGLYZE XR) 2.03-999 MG TB24 Take 1 tablet by mouth 2 (two) times daily.  . solifenacin (VESICARE) 10 MG tablet Take 1 tablet (10 mg total) by mouth daily.   * Granddaughter ,who is her caregiver says that she has not been eating very well for several weeks but has eaten well the last several days.  * currently on doxycycline for UTI.  Diabetes  She presents for her follow-up diabetic visit. She has type 2 diabetes mellitus. Her disease course has been fluctuating. Pertinent negatives for hypoglycemia include no headaches. Pertinent negatives for diabetes include no chest pain, no fatigue, no polydipsia, no polyphagia, no polyuria and no visual change. Diabetic complications include a CVA. Pertinent negatives for diabetic complications include no retinopathy. Current diabetic treatment includes insulin injections and oral agent (dual therapy) (only needs insulin 1x a week- not sure wgat causes blodd sugar to go up 1x per week.). When asked about meal planning, she reported none. She has not had a previous visit with a dietitian. She rarely participates in exercise. Her home blood glucose trend is fluctuating minimally. Her breakfast blood glucose is taken between 8-9 am. Her breakfast blood glucose range is generally 140-180 mg/dl. Her highest blood glucose is >200 mg/dl. She sees a podiatrist.Eye exam is not current.  Hypertension  This is a chronic problem. The current episode started more than 1 year  ago. The problem has been waxing and waning since onset. Pertinent negatives include no chest pain, headaches, neck pain, palpitations or shortness of breath. Risk factors for coronary artery disease include diabetes mellitus, dyslipidemia, family history, obesity, sedentary lifestyle and post-menopausal state. Past treatments include ACE inhibitors, calcium channel blockers and beta blockers. The current treatment provides moderate improvement. Compliance problems include diet and  exercise.  Hypertensive end-organ damage includes CAD/MI, CVA and a thyroid problem. There is no history of retinopathy.  Congestive Heart Failure  Presents for follow-up visit. Pertinent negatives include no chest pain, fatigue, palpitations or shortness of breath. The symptoms have been stable. Past treatments include ACE inhibitors (she also takes lasix daily and zaroxlyn as needed when swelling worsens and she has SOB.). Her past medical history is significant for CAD, CVA, DM and HTN.  Hyperlipidemia  This is a chronic problem. The current episode started more than 1 year ago. The problem is uncontrolled. Recent lipid tests were reviewed and are variable. Pertinent negatives include no chest pain or shortness of breath. She is currently on no antihyperlipidemic treatment.  Thyroid Problem  Presents for follow-up visit. Patient reports no constipation, fatigue, hair loss, heat intolerance, palpitations or visual change. The symptoms have been stable. Her past medical history is significant for hyperlipidemia.  Gastroesophageal Reflux  She reports no chest pain, no coughing or no nausea. Pertinent negatives include no fatigue. She has tried a PPI for the symptoms. The treatment provided moderate relief. Past procedures include an EGD.  Depression Pt takes Celexa 20 mg-Pt tolerate well with no complaints Insomnia Pt taking Remeron 15 mg- Pt states it helps her sleep but she seems to have her day sand nights mixed up.  hypokalemia K+ supplements daily- no c/o lower ext cramping Stress urinary incontinence Is currently on myrbetriq which helps some but has occasssional accidents COPD Hx of smoking- on symbicort daily- helps with her wheezing and chronic cough- does not have need for rescue inhaler. dementia She is on namenda which helps some-- but very difficult to reorient her - she can get very argumentative at times.  *Review of Systems  Constitutional: Negative for fatigue.  Respiratory:  Negative for cough and shortness of breath.   Cardiovascular: Negative for chest pain and palpitations.  Gastrointestinal: Negative for constipation and nausea.  Endocrine: Negative for heat intolerance, polydipsia, polyphagia and polyuria.  Musculoskeletal: Negative for neck pain.  Neurological: Negative for headaches.  All other systems reviewed and are negative.      Objective:   Physical Exam  Constitutional: She appears well-developed and well-nourished.  HENT:  Head: Normocephalic.  nontender 3cm mobile pulsating mass right side of neck  Eyes: EOM are normal. Pupils are equal, round, and reactive to light.  Neck: Normal range of motion. Neck supple. No thyromegaly present.  Cardiovascular: Normal rate, regular rhythm, normal heart sounds and intact distal pulses.   No murmur heard. Pulmonary/Chest: Effort normal and breath sounds normal. No respiratory distress. She has no wheezes. She has no rales. She exhibits no tenderness.  Abdominal: Soft. Bowel sounds are normal. She exhibits no distension. There is no tenderness.  Musculoskeletal: Normal range of motion. She exhibits no tenderness. Edema: 2+ edema bil lower ext.  Neurological: She is alert.  Patient very disoriented  Skin: Skin is warm and dry.  2cm raised lesion right lower back  Psychiatric: She has a normal mood and affect. Her behavior is normal. Judgment and thought content normal.  Vitals reviewed.   BP Marland Kitchen)  170/74   Pulse 71   Temp 97.1 F (36.2 C) (Oral)   Ht 5' (1.524 m)   Wt 132 lb (59.9 kg)   LMP 02/04/1963   BMI 25.78 kg/m    hgba1c 5.6%   Assessment & Plan:  1. Essential hypertension Low sodium diet - CMP14+EGFR - lisinopril (PRINIVIL,ZESTRIL) 20 MG tablet; Take 1 tablet (20 mg total) by mouth daily.  Dispense: 30 tablet; Refill: 5 - carvedilol (COREG) 25 MG tablet; TAKE ONE TABLET BY MOUTH TWICE DAILY WITH MEALS MUST  BE  SEEN  FOR  ADDITIONAL  REFILLS  Dispense: 60 tablet; Refill: 5 -  amLODipine (NORVASC) 5 MG tablet; TAKE ONE TABLET BY MOUTH ONCE DAILY MUST  BE  SEEN  FOR  ADDITIONAL  REFILLS  Dispense: 30 tablet; Refill: 5  2. Hyperlipidemia, unspecified hyperlipidemia type Low fat diet - Lipid panel - fenofibrate (TRICOR) 145 MG tablet; Take 1 tablet (145 mg total) by mouth daily.  Dispense: 30 tablet; Refill: 5  3. Type 1 diabetes mellitus without complication (HCC) Continue to watch carbs in diet - Bayer DCA Hb A1c Waived - Saxagliptin-Metformin (KOMBIGLYZE XR) 2.03-999 MG TB24; Take 1 tablet by mouth 2 (two) times daily.  Dispense: 60 tablet; Refill: 5 - insulin glargine (LANTUS) 100 UNIT/ML injection; INJECT 24 UNITS SUBCUTANEOUSLY AT BEDTIME  Dispense: 10 vial; Refill: 5  4. Chronic systolic congestive heart failure (HCC) - furosemide (LASIX) 40 MG tablet; Take 0.5 tablets (20 mg total) by mouth daily.  Dispense: 30 tablet  5. Recurrent major depressive disorder, in partial remission (HCC) Stress management - citalopram (CELEXA) 20 MG tablet; TAKE ONE TABLET BY MOUTH ONCE DAILY MUST  BE  SEEN  FOR  ADDITIONAL  REFILLS  Dispense: 30 tablet; Refill: 5  6. Gastroesophageal reflux disease without esophagitis Avoid spicy foods Do not eat 2 hours prior to bedtime - omeprazole (PRILOSEC) 40 MG capsule; Take 1 capsule (40 mg total) by mouth daily.  Dispense: 30 capsule; Refill: 5  7. Hypokalemia - potassium chloride (K-DUR) 10 MEQ tablet; Take 2 tablets (20 mEq total) by mouth daily.  Dispense: 60 tablet; Refill: 5  8. Lewy body dementia with behavioral disturbance - memantine (NAMENDA) 10 MG tablet; Take 1 tablet (10 mg total) by mouth 2 (two) times daily.  Dispense: 30 tablet; Refill: 5  9. Stress incontinence - solifenacin (VESICARE) 10 MG tablet; Take 1 tablet (10 mg total) by mouth daily.  Dispense: 30 tablet; Refill: 5  10. Skin lesion - Ambulatory referral to Dermatology  11. Neck mass - US Soft Tissue Head/Neck; Future    Labs pending Health  maintenance reviewed Diet and exercise encouraged Continue all meds Follow up  In 3 months   Kins[place], FNP

## 2016-11-17 NOTE — Patient Instructions (Signed)
Dementia Introduction Dementia means losing some of your brain ability. People with dementia may have problems with:  Memory.  Making decisions.  Behavior.  Speaking.  Thinking.  Solving problems. Follow these instructions at home: Medicine  Take over-the-counter and prescription medicines only as told by your doctor.  Avoid taking medicines that can change how you think. These include pain or sleeping medicines. Lifestyle  Make healthy choices:  Be active as told by your doctor.  Do not use any tobacco products, such as cigarettes, chewing tobacco, and e-cigarettes. If you need help quitting, ask your doctor.  Eat a healthy diet.  When you get stressed, do something to help yourself relax. Your doctor can give you tips.  Spend time with other people.  Drink enough fluid to keep your pee (urine) clear or pale yellow.  Make sure you get good sleep. Use these tips to help you get a good night's rest:  Try not to take naps during the day.  Keep your sleeping area dark and cool.  In the few hours before you go to bed, try not to do any exercise.  Try not to have foods and drinks with caffeine in the evening. General instructions  Talk with your doctor to figure out:  What you need help with.  What your safety needs are.  If you were given a bracelet that tracks your location, make sure to wear it.  Keep all follow-up visits as told by your doctor. This is important. Contact a doctor if:  You have any new problems.  You have problems with choking or swallowing.  You have any symptoms of a different sickness. Get help right away if:  You have a fever.  You feel mixed up (confused) or more mixed up than before.  You have new sleepiness.  You have sleepiness that gets worse.  You have a hard time staying awake.  You or your family members are worried for your safety. This information is not intended to replace advice given to you by your health  care provider. Make sure you discuss any questions you have with your health care provider. Document Released: 10/12/2008 Document Revised: 04/06/2016 Document Reviewed: 07/28/2015  2017 [employer]vier  

## 2016-11-18 LAB — CMP14+EGFR
ALK PHOS: 36 IU/L — AB (ref 39–117)
ALT: 13 IU/L (ref 0–32)
AST: 20 IU/L (ref 0–40)
Albumin/Globulin Ratio: 1.5 (ref 1.2–2.2)
Albumin: 3.7 g/dL (ref 3.5–4.7)
BILIRUBIN TOTAL: 0.3 mg/dL (ref 0.0–1.2)
BUN/Creatinine Ratio: 25 (ref 12–28)
BUN: 30 mg/dL — AB (ref 8–27)
CHLORIDE: 101 mmol/L (ref 96–106)
CO2: 28 mmol/L (ref 18–29)
Calcium: 9.3 mg/dL (ref 8.7–10.3)
Creatinine, Ser: 1.22 mg/dL — ABNORMAL HIGH (ref 0.57–1.00)
GFR calc Af Amer: 47 mL/min/{1.73_m2} — ABNORMAL LOW (ref >59)
GFR calc non Af Amer: 41 mL/min/{1.73_m2} — ABNORMAL LOW (ref >59)
Globulin, Total: 2.4 g/dL (ref 1.5–4.5)
Glucose: 122 mg/dL — ABNORMAL HIGH (ref 65–99)
Potassium: 4.2 mmol/L (ref 3.5–5.2)
Sodium: 144 mmol/L (ref 134–144)
TOTAL PROTEIN: 6.1 g/dL (ref 6.0–8.5)

## 2016-11-18 LAB — LIPID PANEL
CHOLESTEROL TOTAL: 140 mg/dL (ref 100–199)
Chol/HDL Ratio: 5.2 ratio units — ABNORMAL HIGH (ref 0.0–4.4)
HDL: 27 mg/dL — AB (ref >39)
LDL Calculated: 64 mg/dL (ref 0–99)
Triglycerides: 244 mg/dL — ABNORMAL HIGH (ref 0–149)
VLDL CHOLESTEROL CAL: 49 mg/dL — AB (ref 5–40)

## 2016-11-21 ENCOUNTER — Ambulatory Visit: Payer: Medicare Other | Admitting: Nurse Practitioner

## 2016-11-23 ENCOUNTER — Telehealth: Payer: Self-pay

## 2016-11-23 ENCOUNTER — Other Ambulatory Visit: Payer: Self-pay | Admitting: Nurse Practitioner

## 2016-11-23 MED ORDER — FESOTERODINE FUMARATE ER 8 MG PO TB24: 8.0000 mg | ORAL_TABLET | Freq: Every day | ORAL | 5 refills | Status: DC

## 2016-11-23 NOTE — Progress Notes (Signed)
lmtcb

## 2016-11-23 NOTE — Progress Notes (Signed)
Insurance would no longer pay for vesicare- changed rx to [place]toviaz

## 2016-11-27 ENCOUNTER — Telehealth: Payer: Self-pay | Admitting: Nurse Practitioner

## 2016-11-27 ENCOUNTER — Other Ambulatory Visit: Payer: Self-pay | Admitting: Nurse Practitioner

## 2016-11-27 MED ORDER — METFORMIN HCL 1000 MG PO TABS: 1000.0000 mg | ORAL_TABLET | Freq: Two times a day (BID) | ORAL | 3 refills | Status: DC

## 2016-11-27 NOTE — Telephone Encounter (Signed)
Kombiglyze is too expensive Can this be changed to something else Please advise

## 2016-11-27 NOTE — Progress Notes (Signed)
Pt notified of new RX 

## 2016-11-27 NOTE — Progress Notes (Signed)
Left message for patient to call.

## 2016-11-27 NOTE — Progress Notes (Signed)
Changed kombiglize to metformin 100mg - needs to take metformin 2x a day

## 2016-11-28 ENCOUNTER — Ambulatory Visit (HOSPITAL_COMMUNITY)
Admission: RE | Admit: 2016-11-28 | Discharge: 2016-11-28 | Disposition: A | Discharge status: Home / Self Care | Payer: Medicare Other | Source: Ambulatory Visit | Attending: Nurse Practitioner | Admitting: Nurse Practitioner

## 2016-11-28 DIAGNOSIS — R221 Localized swelling, mass and lump, neck: Secondary | ICD-10-CM | POA: Insufficient documentation

## 2016-12-04 NOTE — Telephone Encounter (Signed)
x

## 2016-12-14 ENCOUNTER — Other Ambulatory Visit: Payer: Self-pay | Admitting: Nurse Practitioner

## 2016-12-21 ENCOUNTER — Telehealth: Payer: Self-pay | Admitting: Nurse Practitioner

## 2016-12-21 NOTE — Telephone Encounter (Signed)
Grand daughter wants to know if Lantus can be changed to something else. She states that they are paying $300 a month for meds and they are having a hard time paying for it. Please advise and route to pool B

## 2016-12-22 NOTE — Telephone Encounter (Signed)
tammy can you please look into this for me.

## 2016-12-25 MED ORDER — INSULIN NPH (HUMAN) (ISOPHANE) 100 UNIT/ML ~~LOC~~ SUSP: 24.0000 [IU] | Freq: Every day | SUBCUTANEOUS | 2 refills | Status: DC

## 2016-12-25 NOTE — Telephone Encounter (Signed)
Granddaughter aware

## 2016-12-25 NOTE — Telephone Encounter (Signed)
Patient must meet $200 deductible at beginning of each year.  Also because patient only takes 24 units daily - 1 bottle or 10ml lasts her 42 days and insurance is probably charging 2 copays per bottle.  I asked [facility] to check price on 2 bottle which would be 84 days supply and cost doubled.  [facility].  Explained that there can be more hypoglycemia and we might need to adjust dose to bid.  Recommended that they check BG tid - qid.  To call office for adjustment in insulin if BG is less than 80 or over 200.

## 2016-12-25 NOTE — Addendum Note (Signed)
Addended by: Henrene PastorECKARD, Jancarlos Thrun on: 12/25/2016 01:55 PM   Modules accepted: Orders

## 2017-01-01 ENCOUNTER — Inpatient Hospital Stay (HOSPITAL_COMMUNITY)
Admission: EM | Admit: 2017-01-01 | Discharge: 2017-01-07 | DRG: 871 | Disposition: A | Discharge status: Home / Self Care | Payer: Medicare Other | Attending: Internal Medicine | Admitting: Internal Medicine

## 2017-01-01 ENCOUNTER — Encounter (HOSPITAL_COMMUNITY): Payer: Self-pay

## 2017-01-01 ENCOUNTER — Emergency Department (HOSPITAL_COMMUNITY): Payer: Medicare Other

## 2017-01-01 DIAGNOSIS — A419 Sepsis, unspecified organism: Secondary | ICD-10-CM

## 2017-01-01 DIAGNOSIS — E1122 Type 2 diabetes mellitus with diabetic chronic kidney disease: Secondary | ICD-10-CM | POA: Diagnosis present

## 2017-01-01 DIAGNOSIS — R197 Diarrhea, unspecified: Secondary | ICD-10-CM | POA: Diagnosis present

## 2017-01-01 DIAGNOSIS — N183 Chronic kidney disease, stage 3 (moderate): Secondary | ICD-10-CM | POA: Diagnosis present

## 2017-01-01 DIAGNOSIS — Z794 Long term (current) use of insulin: Secondary | ICD-10-CM

## 2017-01-01 DIAGNOSIS — R0682 Tachypnea, not elsewhere classified: Secondary | ICD-10-CM | POA: Diagnosis present

## 2017-01-01 DIAGNOSIS — E039 Hypothyroidism, unspecified: Secondary | ICD-10-CM | POA: Diagnosis not present

## 2017-01-01 DIAGNOSIS — Z9889 Other specified postprocedural states: Secondary | ICD-10-CM

## 2017-01-01 DIAGNOSIS — T17908A Unspecified foreign body in respiratory tract, part unspecified causing other injury, initial encounter: Secondary | ICD-10-CM | POA: Diagnosis not present

## 2017-01-01 DIAGNOSIS — N170 Acute kidney failure with tubular necrosis: Secondary | ICD-10-CM | POA: Diagnosis present

## 2017-01-01 DIAGNOSIS — E119 Type 2 diabetes mellitus without complications: Secondary | ICD-10-CM

## 2017-01-01 DIAGNOSIS — E861 Hypovolemia: Secondary | ICD-10-CM | POA: Diagnosis present

## 2017-01-01 DIAGNOSIS — R062 Wheezing: Secondary | ICD-10-CM

## 2017-01-01 DIAGNOSIS — F0391 Unspecified dementia with behavioral disturbance: Secondary | ICD-10-CM | POA: Diagnosis not present

## 2017-01-01 DIAGNOSIS — R7989 Other specified abnormal findings of blood chemistry: Secondary | ICD-10-CM | POA: Diagnosis present

## 2017-01-01 DIAGNOSIS — Z87440 Personal history of urinary (tract) infections: Secondary | ICD-10-CM | POA: Diagnosis not present

## 2017-01-01 DIAGNOSIS — I10 Essential (primary) hypertension: Secondary | ICD-10-CM | POA: Diagnosis not present

## 2017-01-01 DIAGNOSIS — N390 Urinary tract infection, site not specified: Secondary | ICD-10-CM | POA: Diagnosis present

## 2017-01-01 DIAGNOSIS — I130 Hypertensive heart and chronic kidney disease with heart failure and stage 1 through stage 4 chronic kidney disease, or unspecified chronic kidney disease: Secondary | ICD-10-CM | POA: Diagnosis present

## 2017-01-01 DIAGNOSIS — I5032 Chronic diastolic (congestive) heart failure: Secondary | ICD-10-CM | POA: Diagnosis present

## 2017-01-01 DIAGNOSIS — D6959 Other secondary thrombocytopenia: Secondary | ICD-10-CM | POA: Diagnosis present

## 2017-01-01 DIAGNOSIS — Z8249 Family history of ischemic heart disease and other diseases of the circulatory system: Secondary | ICD-10-CM | POA: Diagnosis not present

## 2017-01-01 DIAGNOSIS — R1314 Dysphagia, pharyngoesophageal phase: Secondary | ICD-10-CM | POA: Diagnosis present

## 2017-01-01 DIAGNOSIS — Z66 Do not resuscitate: Secondary | ICD-10-CM | POA: Diagnosis present

## 2017-01-01 DIAGNOSIS — N3001 Acute cystitis with hematuria: Secondary | ICD-10-CM

## 2017-01-01 DIAGNOSIS — Z90710 Acquired absence of both cervix and uterus: Secondary | ICD-10-CM

## 2017-01-01 DIAGNOSIS — F0390 Unspecified dementia without behavioral disturbance: Secondary | ICD-10-CM | POA: Diagnosis present

## 2017-01-01 DIAGNOSIS — R7881 Bacteremia: Secondary | ICD-10-CM | POA: Diagnosis not present

## 2017-01-01 DIAGNOSIS — Z882 Allergy status to sulfonamides status: Secondary | ICD-10-CM

## 2017-01-01 DIAGNOSIS — E872 Acidosis: Secondary | ICD-10-CM | POA: Diagnosis present

## 2017-01-01 DIAGNOSIS — D696 Thrombocytopenia, unspecified: Secondary | ICD-10-CM | POA: Diagnosis not present

## 2017-01-01 DIAGNOSIS — A4151 Sepsis due to Escherichia coli [E. coli]: Secondary | ICD-10-CM | POA: Diagnosis present

## 2017-01-01 DIAGNOSIS — Z8673 Personal history of transient ischemic attack (TIA), and cerebral infarction without residual deficits: Secondary | ICD-10-CM

## 2017-01-01 DIAGNOSIS — Z7982 Long term (current) use of aspirin: Secondary | ICD-10-CM | POA: Diagnosis not present

## 2017-01-01 DIAGNOSIS — Z79899 Other long term (current) drug therapy: Secondary | ICD-10-CM | POA: Diagnosis not present

## 2017-01-01 DIAGNOSIS — M810 Age-related osteoporosis without current pathological fracture: Secondary | ICD-10-CM | POA: Diagnosis present

## 2017-01-01 DIAGNOSIS — N179 Acute kidney failure, unspecified: Secondary | ICD-10-CM | POA: Diagnosis present

## 2017-01-01 DIAGNOSIS — D649 Anemia, unspecified: Secondary | ICD-10-CM | POA: Diagnosis present

## 2017-01-01 HISTORY — DX: Unspecified dementia, unspecified severity, without behavioral disturbance, psychotic disturbance, mood disturbance, and anxiety: F03.90

## 2017-01-01 LAB — CBC WITH DIFFERENTIAL/PLATELET
BASOS ABS: 0 10*3/uL (ref 0.0–0.1)
BASOS PCT: 0 %
EOS PCT: 0 %
Eosinophils Absolute: 0 10*3/uL (ref 0.0–0.7)
HCT: 37.9 % (ref 36.0–46.0)
Hemoglobin: 12.6 g/dL (ref 12.0–15.0)
LYMPHS PCT: 19 %
Lymphs Abs: 1.4 10*3/uL (ref 0.7–4.0)
MCH: 31 pg (ref 26.0–34.0)
MCHC: 33.2 g/dL (ref 30.0–36.0)
MCV: 93.3 fL (ref 78.0–100.0)
Monocytes Absolute: 0.5 10*3/uL (ref 0.1–1.0)
Monocytes Relative: 6 %
NEUTROS ABS: 5.5 10*3/uL (ref 1.7–7.7)
Neutrophils Relative %: 75 %
PLATELETS: 77 10*3/uL — AB (ref 150–400)
RBC: 4.06 MIL/uL (ref 3.87–5.11)
RDW: 15.4 % (ref 11.5–15.5)
WBC: 7.3 10*3/uL (ref 4.0–10.5)

## 2017-01-01 LAB — I-STAT CG4 LACTIC ACID, ED
LACTIC ACID, VENOUS: 3.67 mmol/L — AB (ref 0.5–1.9)
Lactic Acid, Venous: 2.68 mmol/L (ref 0.5–1.9)

## 2017-01-01 LAB — URINALYSIS, ROUTINE W REFLEX MICROSCOPIC
BILIRUBIN URINE: NEGATIVE
Glucose, UA: NEGATIVE mg/dL
KETONES UR: NEGATIVE mg/dL
NITRITE: NEGATIVE
PROTEIN: 100 mg/dL — AB
Specific Gravity, Urine: 1.015 (ref 1.005–1.030)
pH: 5 (ref 5.0–8.0)

## 2017-01-01 LAB — PROTIME-INR: Prothrombin Time: <10 seconds — ABNORMAL LOW (ref 11.4–15.2)

## 2017-01-01 LAB — COMPREHENSIVE METABOLIC PANEL
ALT: 21 U/L (ref 14–54)
ANION GAP: 10 (ref 5–15)
AST: 51 U/L — ABNORMAL HIGH (ref 15–41)
Albumin: 3.5 g/dL (ref 3.5–5.0)
Alkaline Phosphatase: 25 U/L — ABNORMAL LOW (ref 38–126)
BUN: 42 mg/dL — ABNORMAL HIGH (ref 6–20)
CALCIUM: 9.7 mg/dL (ref 8.9–10.3)
CHLORIDE: 103 mmol/L (ref 101–111)
CO2: 26 mmol/L (ref 22–32)
Creatinine, Ser: 3 mg/dL — ABNORMAL HIGH (ref 0.44–1.00)
GFR calc non Af Amer: 13 mL/min — ABNORMAL LOW (ref >60)
GFR, EST AFRICAN AMERICAN: 15 mL/min — AB (ref >60)
Glucose, Bld: 213 mg/dL — ABNORMAL HIGH (ref 65–99)
Potassium: 4.8 mmol/L (ref 3.5–5.1)
SODIUM: 139 mmol/L (ref 135–145)
Total Bilirubin: 0.7 mg/dL (ref 0.3–1.2)
Total Protein: 6.6 g/dL (ref 6.5–8.1)

## 2017-01-01 LAB — INFLUENZA PANEL BY PCR (TYPE A & B)
INFLAPCR: NEGATIVE
Influenza B By PCR: NEGATIVE

## 2017-01-01 MED ORDER — ACETAMINOPHEN 650 MG RE SUPP
650.0000 mg | Freq: Four times a day (QID) | RECTAL | Status: DC | PRN
  Filled 2017-01-01: qty 1

## 2017-01-01 MED ORDER — ACETAMINOPHEN 500 MG PO TABS
1000.0000 mg | ORAL_TABLET | Freq: Once | ORAL | Status: AC
  Administered 2017-01-01: 1000 mg via ORAL
  Filled 2017-01-01: qty 2

## 2017-01-01 MED ORDER — SODIUM CHLORIDE 0.9 % IV BOLUS (SEPSIS)
1000.0000 mL | Freq: Once | INTRAVENOUS | Status: AC
  Administered 2017-01-01: 1000 mL via INTRAVENOUS

## 2017-01-01 MED ORDER — LEVOTHYROXINE SODIUM 50 MCG PO TABS
75.0000 ug | ORAL_TABLET | Freq: Every day | ORAL | Status: DC
  Administered 2017-01-02 – 2017-01-07 (×6): 75 ug via ORAL
  Filled 2017-01-01 (×6): qty 2

## 2017-01-01 MED ORDER — SODIUM CHLORIDE 0.9 % IV BOLUS (SEPSIS)
1000.0000 mL | Freq: Once | INTRAVENOUS | Status: AC
Start: 2017-01-01 — End: 2017-01-01
  Administered 2017-01-01: 1000 mL via INTRAVENOUS

## 2017-01-01 MED ORDER — ACETAMINOPHEN 325 MG PO TABS
650.0000 mg | ORAL_TABLET | Freq: Four times a day (QID) | ORAL | Status: DC | PRN
  Administered 2017-01-06: 650 mg via ORAL
  Filled 2017-01-01: qty 2

## 2017-01-01 MED ORDER — PIPERACILLIN-TAZOBACTAM 3.375 G IVPB 30 MIN
3.3750 g | Freq: Once | INTRAVENOUS | Status: AC
  Administered 2017-01-01: 3.375 g via INTRAVENOUS
  Filled 2017-01-01: qty 50

## 2017-01-01 MED ORDER — CITALOPRAM HYDROBROMIDE 20 MG PO TABS
20.0000 mg | ORAL_TABLET | Freq: Every day | ORAL | Status: DC
  Administered 2017-01-02 – 2017-01-07 (×6): 20 mg via ORAL
  Filled 2017-01-01 (×7): qty 1

## 2017-01-01 MED ORDER — ONDANSETRON HCL 4 MG PO TABS: 4.0000 mg | ORAL_TABLET | Freq: Four times a day (QID) | ORAL | Status: DC | PRN

## 2017-01-01 MED ORDER — DEXTROSE 5 % IV SOLN
2.0000 g | Freq: Once | INTRAVENOUS | Status: AC
  Administered 2017-01-02: 2 g via INTRAVENOUS

## 2017-01-01 MED ORDER — SODIUM CHLORIDE 0.9 % IV SOLN
INTRAVENOUS | Status: AC
  Administered 2017-01-02 – 2017-01-03 (×2): via INTRAVENOUS

## 2017-01-01 MED ORDER — MORPHINE SULFATE (PF) 2 MG/ML IV SOLN: 1.0000 mg | INTRAVENOUS | Status: DC | PRN

## 2017-01-01 MED ORDER — HYDROCODONE-ACETAMINOPHEN 5-325 MG PO TABS
1.0000 | ORAL_TABLET | ORAL | Status: DC | PRN
  Administered 2017-01-04 (×2): 2 via ORAL
  Filled 2017-01-01 (×2): qty 2

## 2017-01-01 MED ORDER — VANCOMYCIN HCL IN DEXTROSE 1-5 GM/200ML-% IV SOLN
1000.0000 mg | Freq: Once | INTRAVENOUS | Status: AC
Start: 2017-01-01 — End: 2017-01-01
  Administered 2017-01-01: 1000 mg via INTRAVENOUS
  Filled 2017-01-01: qty 200

## 2017-01-01 MED ORDER — INSULIN GLARGINE 100 UNIT/ML ~~LOC~~ SOLN
20.0000 [IU] | Freq: Every day | SUBCUTANEOUS | Status: DC
  Administered 2017-01-02: 20 [IU] via SUBCUTANEOUS
  Filled 2017-01-01 (×2): qty 0.2

## 2017-01-01 MED ORDER — INSULIN ASPART 100 UNIT/ML ~~LOC~~ SOLN
0.0000 [IU] | SUBCUTANEOUS | Status: DC
  Administered 2017-01-02 (×2): 3 [IU] via SUBCUTANEOUS
  Administered 2017-01-03 (×2): 2 [IU] via SUBCUTANEOUS
  Administered 2017-01-04: 3 [IU] via SUBCUTANEOUS
  Administered 2017-01-04: 5 [IU] via SUBCUTANEOUS
  Administered 2017-01-05: 3 [IU] via SUBCUTANEOUS
  Administered 2017-01-06 – 2017-01-07 (×4): 2 [IU] via SUBCUTANEOUS

## 2017-01-01 MED ORDER — ONDANSETRON HCL 4 MG/2ML IJ SOLN: 4.0000 mg | Freq: Four times a day (QID) | INTRAMUSCULAR | Status: DC | PRN

## 2017-01-01 MED ORDER — MEMANTINE HCL 10 MG PO TABS
10.0000 mg | ORAL_TABLET | Freq: Two times a day (BID) | ORAL | Status: DC
  Administered 2017-01-02 – 2017-01-07 (×11): 10 mg via ORAL
  Filled 2017-01-01 (×11): qty 1

## 2017-01-01 MED ORDER — PANTOPRAZOLE SODIUM 40 MG PO TBEC
40.0000 mg | DELAYED_RELEASE_TABLET | Freq: Every day | ORAL | Status: DC
  Administered 2017-01-02 – 2017-01-07 (×6): 40 mg via ORAL
  Filled 2017-01-01 (×6): qty 1

## 2017-01-01 NOTE — H&P (Signed)
History and Physical    Jamie Peershyllis D Cangelosi ZOX:[id number] DOB: August 03, 1930 DOA: 01/01/2017  PCP: Bennie PieriniMary-Margaret Martin, FNP   Patient coming from: Home  Chief Complaint: Watery diarrhea, chills, agitation, food-refusal   HPI: Jamie Cochran is a 83 y.o. female with medical history significant for dementia, chronic diastolic CHF, hypothyroidism, hypertension, insulin-dependent diabetes mellitus, and recurrent UTI who presents to the emergency department with watery diarrhea, chills, food refusal, and agitation. Patient was reportedly in her usual state of health until yesterday evening when she developed watery diarrhea, experiencing roughly 3 episodes. She was also noted to have shaking chills last night before going to bed. She seemed to have improved this morning, but then went on to have numerous episodes of watery diarrhea throughout the day. She also had recurrence in the shaking chills, but had no pain complaints and no vomiting or cough. Patient refused food or drink throughout the day today and began to become agitated with her granddaughter this evening. She was brought in for evaluation of this and has not attempted any interventions at home prior to coming in. Granddaughter reports that the patient has had recurrent UTIs and was treated with antibiotics a couple months ago. They do not know of any history of C. difficile.  ED Course: Upon arrival to the ED, patient is found to be febrile to 39.8 C, saturating 90% on room air, mildly tachypneic, and was soft but stable blood pressure. EKG features a sinus rhythm and chest x-ray is notable for a tiny left pleural effusion, low volume inspiration, and cardiomegaly without acute failure. Chemistry panel is most notable for BUN of 42 and serum creatinine of 3.00, up from an apparent baseline of roughly 1. CBC features a thrombocytopenia with platelets 77,000, previously only slightly low 149,000. Urinalysis is consistent with infection and  features moderate hemoglobin. Lactic acid is elevated to a value of 3.67 and influenza PCR is negative. Patient was given 30 cc/kg bolus of normal saline, blood and urine cultures were obtained, 1 g of acetaminophen was given, and the patient was started on empiric vancomycin and Zosyn. Blood pressure remains a little soft but stable and the patient is in no acute distress. She will be admitted to the medical/surgical unit for ongoing evaluation and management of sepsis secondary to UTI with watery diarrhea concerning for possible C. difficile.  Review of Systems:  All other systems reviewed and apart from HPI, are negative.  Past Medical History:  Diagnosis Date  . CHF (congestive heart failure) (HCC)   . Dementia   . Diabetes type 2, uncontrolled (HCC)   . Hearing loss    history of right sided cholesteotoma s/p Right tympanoplasty with ossicular reconstruction. (03/2002)  . Hyperlipidemia   . Hypertension   . Hypothyroidism   . Non-occlusive coronary artery disease    40% first diagonal stenosis, 50% obtuse marginal stenosis  . Osteoporosis   . Stroke (HCC) in 05/2012, ?   deep right frontal perventricular deep white matter infarct + subacute left thalamic lacunar infarct  . Urinary incontinence    s/p surgery for ureterocele  . UTI (lower urinary tract infection) 09/2015   granddaughter reports "constant UTIs"    Past Surgical History:  Procedure Laterality Date  . ABDOMINAL HYSTERECTOMY    . BLADDER REPAIR    . CESAREAN SECTION    . EYE SURGERY    . INNER EAR SURGERY    . TONSILLECTOMY AND ADENOIDECTOMY       reports that she has  never smoked. She has never used smokeless tobacco. She reports that she does not drink alcohol or use drugs.  Allergies  Allergen Reactions  . Sulfa Antibiotics Rash    Family History  Problem Relation Age of Onset  . Heart disease Father      Prior to Admission medications   Medication Sig Start Date End Date Taking? Authorizing  Provider  amLODipine (NORVASC) 5 MG tablet TAKE ONE TABLET BY MOUTH ONCE DAILY MUST  BE  SEEN  FOR  ADDITIONAL  REFILLS Patient taking differently: Take 5 mg by mouth daily. TAKE ONE TABLET BY MOUTH ONCE DAILY MUST  BE  SEEN  FOR  ADDITIONAL  REFILLS 11/17/16  Yes Mary-Margaret Daphine Deutscher, FNP  aspirin 81 MG tablet Take 81 mg by mouth daily.   Yes Historical Provider, MD  carvedilol (COREG) 25 MG tablet TAKE ONE TABLET BY MOUTH TWICE DAILY WITH MEALS MUST  BE  SEEN  FOR  ADDITIONAL  REFILLS Patient taking differently: Take 25 mg by mouth 2 (two) times daily with a meal.  11/17/16  Yes Mary-Margaret Daphine Deutscher, FNP  citalopram (CELEXA) 20 MG tablet TAKE ONE TABLET BY MOUTH ONCE DAILY MUST  BE  SEEN  FOR  ADDITIONAL  REFILLS Patient taking differently: Take 20 mg by mouth daily.  11/17/16  Yes Mary-Margaret Daphine Deutscher, FNP  fenofibrate (TRICOR) 145 MG tablet Take 1 tablet (145 mg total) by mouth daily. 11/17/16  Yes Mary-Margaret Daphine Deutscher, FNP  furosemide (LASIX) 20 MG tablet TAKE TWO TABLETS BY MOUTH ONCE DAILY 12/15/16  Yes Mary-Margaret Daphine Deutscher, FNP  insulin NPH Human (NOVOLIN N RELION) 100 UNIT/ML injection Inject 0.24 mLs (24 Units total) into the skin daily before breakfast. 12/25/16  Yes Tammy Eckard, PharmD  levothyroxine (SYNTHROID, LEVOTHROID) 75 MCG tablet Take 1 tablet (75 mcg total) by mouth daily. 11/17/16  Yes Mary-Margaret Daphine Deutscher, FNP  lisinopril (PRINIVIL,ZESTRIL) 20 MG tablet Take 1 tablet (20 mg total) by mouth daily. 11/17/16  Yes Mary-Margaret Daphine Deutscher, FNP  meloxicam (MOBIC) 15 MG tablet TAKE ONE TABLET BY MOUTH ONCE DAILY 09/26/16  Yes Mary-Margaret Daphine Deutscher, FNP  memantine (NAMENDA) 10 MG tablet Take 1 tablet (10 mg total) by mouth 2 (two) times daily. 11/17/16  Yes Mary-Margaret Daphine Deutscher, FNP  metFORMIN (GLUCOPHAGE) 1000 MG tablet Take 1 tablet (1,000 mg total) by mouth 2 (two) times daily with a meal. 11/27/16  Yes Mary-Margaret Daphine Deutscher, FNP  omeprazole (PRILOSEC) 40 MG capsule Take 1 capsule (40 mg total) by mouth  daily. 11/17/16  Yes Mary-Margaret Daphine Deutscher, FNP  potassium chloride (K-DUR) 10 MEQ tablet Take 2 tablets (20 mEq total) by mouth daily. 11/17/16  Yes Mary-Margaret Daphine Deutscher, FNP  fesoterodine (TOVIAZ) 8 MG TB24 tablet Take 1 tablet (8 mg total) by mouth daily. Patient not taking: Reported on 01/01/2017 11/23/16   Mary-Margaret Daphine Deutscher, FNP  [facility] VERIO test strip USE TO CHECK GLUCOSE THREE TIMES DAILY 07/11/16   Mary-Margaret Daphine Deutscher, FNP    Physical Exam: Vitals:   01/01/17 2230 01/01/17 2300 01/01/17 2329 01/01/17 2330  BP: (!) 123/48 (!) 126/53  (!) 115/49  Pulse: 79 74  77  Resp: 18 17  17   Temp:   100.4 F (38 C)   TempSrc:   Oral   SpO2: 98% 99%  98%  Weight:      Height:          Constitutional: No acute distress, calm, comfortable Eyes: PERTLA, lids and conjunctivae normal ENMT: Mucous membranes are moist. Posterior pharynx clear of any exudate or lesions.  Neck: normal, supple, no masses, no thyromegaly Respiratory: Slightly diminished bilaterally, no wheezing, no crackles. Normal respiratory effort.   Cardiovascular: S1 & S2 heard, regular rate and rhythm, no significant murmurs. No extremity edema. No significant JVD. Abdomen: No distension, no tenderness, no masses palpated. Bowel sounds active.  Musculoskeletal: no clubbing / cyanosis. No joint deformity upper and lower extremities.   Skin: no significant rashes, lesions, ulcers. Warm, dry, well-perfused. Neurologic: No facial asymmetry, gross hearing deficit. Sensation intact, DTR normal. Strength 5/5 in b/l grip.  Psychiatric: Alert and oriented to person, place, and situation. Normal mood and affect.     Labs on Admission: I have personally reviewed following labs and imaging studies  CBC:  Recent Labs Lab 01/01/17 2133  WBC 7.3  NEUTROABS 5.5  HGB 12.6  HCT 37.9  MCV 93.3  PLT 77*   Basic Metabolic Panel:  Recent Labs Lab 01/01/17 2133  NA 139  K 4.8  CL 103  CO2 26  GLUCOSE 213*  BUN 42*    CREATININE 3.00*  CALCIUM 9.7   GFR: Estimated Creatinine Clearance: 11.3 mL/min (by C-G formula based on SCr of 3 mg/dL (H)). Liver Function Tests:  Recent Labs Lab 01/01/17 2133  AST 51*  ALT 21  ALKPHOS 25*  BILITOT 0.7  PROT 6.6  ALBUMIN 3.5   No results for input(s): LIPASE, AMYLASE in the last 168 hours. No results for input(s): AMMONIA in the last 168 hours. Coagulation Profile:  Recent Labs Lab 01/01/17 2133  INR NOT CALCULATED   Cardiac Enzymes: No results for input(s): CKTOTAL, CKMB, CKMBINDEX, TROPONINI in the last 168 hours. BNP (last 3 results) No results for input(s): PROBNP in the last 8760 hours. HbA1C: No results for input(s): HGBA1C in the last 72 hours. CBG: No results for input(s): GLUCAP in the last 168 hours. Lipid Profile: No results for input(s): CHOL, HDL, LDLCALC, TRIG, CHOLHDL, LDLDIRECT in the last 72 hours. Thyroid Function Tests: No results for input(s): TSH, T4TOTAL, FREET4, T3FREE, THYROIDAB in the last 72 hours. Anemia Panel: No results for input(s): VITAMINB12, FOLATE, FERRITIN, TIBC, IRON, RETICCTPCT in the last 72 hours. Urine analysis:    Component Value Date/Time   COLORURINE YELLOW 01/01/2017 2028   APPEARANCEUR TURBID (A) 01/01/2017 2028   APPEARANCEUR Cloudy (A) 11/08/2016 1424   LABSPEC 1.015 01/01/2017 2028   PHURINE 5.0 01/01/2017 2028   GLUCOSEU NEGATIVE 01/01/2017 2028   HGBUR MODERATE (A) 01/01/2017 2028   BILIRUBINUR NEGATIVE 01/01/2017 2028   BILIRUBINUR Negative 11/08/2016 1424   KETONESUR NEGATIVE 01/01/2017 2028   PROTEINUR 100 (A) 01/01/2017 2028   UROBILINOGEN negative 01/03/2016 1451   UROBILINOGEN 1.0 09/17/2015 2300   NITRITE NEGATIVE 01/01/2017 2028   LEUKOCYTESUR MODERATE (A) 01/01/2017 2028   LEUKOCYTESUR 3+ (A) 11/08/2016 1424   Sepsis Labs: @LABRCNTIP (procalcitonin:4,lacticidven:4) ) Recent Results (from the past 240 hour(s))  Culture, blood (Routine x 2)     Status: None (Preliminary  result)   Collection Time: 01/01/17 10:01 PM  Result Value Ref Range Status   Specimen Description BLOOD RIGHT ARM  Final   Special Requests BOTTLES DRAWN AEROBIC AND ANAEROBIC [facility] EACH  Final   Culture PENDING  Incomplete   Report Status PENDING  Incomplete  Culture, blood (Routine x 2)     Status: None (Preliminary result)   Collection Time: 01/01/17 10:10 PM  Result Value Ref Range Status   Specimen Description BLOOD RIGHT HAND  Final   Special Requests BOTTLES DRAWN AEROBIC AND ANAEROBIC [facility] EACH  Final   Culture PENDING  Incomplete   Report Status PENDING  Incomplete     Radiological Exams on Admission: Dg Chest 2 View  Result Date: 01/01/2017 CLINICAL DATA:  Fever with low oxygen saturation EXAM: CHEST  2 VIEW COMPARISON:  05/29/2016 FINDINGS: There are low lung volumes. Tiny left pleural effusion with adjacent atelectasis. Mild cardiomegaly. No pneumothorax. Moderate compression deformity of thoracolumbar vertebra. IMPRESSION: Tiny left pleural effusion with adjacent atelectasis. Low lung volumes. Cardiomegaly without overt failure. Electronically Signed   By: Jasmine Pang M.D.   On: 01/01/2017 21:09    EKG: Independently reviewed. Sinus rhythm.  Assessment/Plan  1. Sepsis secondary to UTI  - Presents with watery diarrhea and shaking chills since the night prior to admission - She is febrile, has AKI and elevated lactate, and UTI is consistent with infection; no infiltrate on CXR, no rash or wound; intraabdominal source considered given diarrhea, but abd exam is benign, will send GI panel and C diff assay - She was given 30 cc/kg NS, blood and urine cultures were collected, and she was started on empiric broad-spectrum abx with vancomycin and Zosyn - Lactic acid has improved from 3.67 to 2.68 with the initial fluid bolus, will continue NS infusion while trending lactate; will be mindful of chronic diastolic dysfunction as below   - Antibiotics narrowed to Rocephin while  following cultures and clinical response to treatment; prior urine cultures have grown E coli sensitive to Rocephin    2. Watery diarrhea  - Started on 12/31/16 and worse on day of admission - There has been no pain complaints and abdominal exam is benign  - Family notes that she was treated with abx a couple mos ago for UTI; deny hx of C diff  - Send sample for GI pathogen panel and C diff assay; maintain enteric precautions in the interim  - Replace fluid-losses and electrolytes  3. Acute kidney injury  - SCr is elevated to 3.00 on admission, up from apparent baseline of 1-1.2  - Likely prerenal azotemia secondary to sepsis and exacerbated by Lasix and lisinopril; ATN possible  - She has been fluid-resuscitated with 30 cc/kg NS in ED and is continued on NS infusion with strict I/O's  - Check renal US and urine studies; hold lisinopril and Lasix; repeat chem panel in am   4. Thrombocytopenia  - Platelets 77,000 on admission, previously in 150k range  - Likely secondary to sepsis; there is no bleeding evident  - Nutritional deficiency less likely, will check B12 and folate    5. Chronic diastolic CHF - Pt is hypovolemic on presentation, was given 30 cc/kg NS, and is continued on NS infusion cautiously as above  - TTE (01/06/15) with EF 60-65%, grade 1 diastolic dysfunction, no significant valvular disease   - She is managed at home with Lasix 20 mg qD, lisinopril, and Coreg; these are held in light of AKI and soft BP, will resume as appropriate   - Follow daily wts and strict I/O's   6. Insulin-dependent DM  - A1c was 6.2% in July 2017  - She is managed at home with human insulin 24 units qAM  - Check CBG q4h until tolerating oral intake  - Start Lantus 20 units qHS with a moderate-intensity Novolog correctional   7. Hypertension - BP has remained soft, but stable  - Managed with lisinopril, Norvasc, and Coreg at home; these are held on admission in light of soft BP  - Monitor and  resume  as needed    8. Dementia  - Appears to be stable on admission; pt is cooperative, contributes to history, and is oriented to person, place, and situation  - Family at bedside note that she often because disoriented at night, frequently wakes with confusion  - Continue Namenda     DVT prophylaxis: SCD's Code Status: DNR Family Communication: Granddaughter updated at bedside Disposition Plan: Admit to med-surg Consults called: None Admission status: Inpatient    Briscoe Deutscher, MD [facility] Pager [phone]  If 7PM-7AM, please contact night-coverage [email or web address] Password [facility]  01/01/2017, 11:56 PM

## 2017-01-01 NOTE — ED Provider Notes (Signed)
[facility]-EMERGENCY DEPT Provider Note   CSN: [id number] Arrival date & time: 01/01/17  2010  By signing my name below, I, Elder Negus, attest that this documentation has been prepared under the direction and in the presence of Briscoe Deutscher, MD. Electronically Signed: Elder Negus, Scribe. 01/01/17. 9:05 PM.   History   Chief Complaint Chief Complaint  Patient presents with  . Emesis   LEVEL 5 CAVEAT DUE TO DEMENTIA  HPI Jamie Cochran is a 83 y.o. female with history of blindness, hearing loss, as well as HTN, HLD, and dementia who presents to the ED for evaluation of fever. According to the patient's daughter who provides history, this patient was in her usual health until yesterday when she developed nonbloody diarrhea with "pale skin" and chills. She did not voice any abdominal pain. This morning the patient woke and was active throughout the day; ambulatory. Tonight, the daughter states that the patient "was fighting her" before going to bed and "refused to eat". She did not vomit. Not moving around and extremely weak tonight. The daughter did not notice any fever; although is febrile in triage. She denies any cough, congestion, or vomiting. No recent falls or head trauma.    HPI  Past Medical History:  Diagnosis Date  . CHF (congestive heart failure) (HCC)   . Dementia   . Diabetes type 2, uncontrolled (HCC)   . Hearing loss    history of right sided cholesteotoma s/p Right tympanoplasty with ossicular reconstruction. (03/2002)  . Hyperlipidemia   . Hypertension   . Hypothyroidism   . Non-occlusive coronary artery disease    40% first diagonal stenosis, 50% obtuse marginal stenosis  . Osteoporosis   . Stroke (HCC) in 05/2012, ?   deep right frontal perventricular deep white matter infarct + subacute left thalamic lacunar infarct  . Urinary incontinence    s/p surgery for ureterocele  . UTI (lower urinary tract infection) 09/2015   granddaughter reports  "constant UTIs"    Patient Active Problem List   Diagnosis Date Noted  . Sepsis secondary to UTI (HCC) 01/01/2017  . Watery diarrhea 01/01/2017  . Thrombocytopenia (HCC) 01/01/2017  . Cervical neck pain with evidence of disc disease 05/30/2016  . Weakness generalized 05/30/2016  . Dementia 05/30/2016  . AKI (acute kidney injury) (HCC) 05/30/2016  . Fever 05/29/2016  . Depression 01/03/2016  . Insomnia 01/03/2016  . Hypokalemia 01/03/2016  . Chronic diastolic CHF (congestive heart failure) (HCC) 09/18/2015  . Dysphagia, pharyngoesophageal phase 01/06/2015  . Diabetes mellitus type 2, controlled (HCC) 01/05/2015  . Right sided weakness 05/28/2012  . Hypertension 02/02/2011  . Osteoporosis 02/02/2011  . Hyperlipidemia 02/02/2011  . Hypothyroidism 02/02/2011  . Stress incontinence, female 02/02/2011  . Osteoarthritis 02/02/2011    Past Surgical History:  Procedure Laterality Date  . ABDOMINAL HYSTERECTOMY    . BLADDER REPAIR    . CESAREAN SECTION    . EYE SURGERY    . INNER EAR SURGERY    . TONSILLECTOMY AND ADENOIDECTOMY      OB History    No data available       Home Medications    Prior to Admission medications   Medication Sig Start Date End Date Taking? Authorizing Provider  amLODipine (NORVASC) 5 MG tablet TAKE ONE TABLET BY MOUTH ONCE DAILY MUST  BE  SEEN  FOR  ADDITIONAL  REFILLS Patient taking differently: Take 5 mg by mouth daily. TAKE ONE TABLET BY MOUTH ONCE DAILY MUST  BE  SEEN  FOR  ADDITIONAL  REFILLS 11/17/16  Yes Mary-Margaret Daphine Deutscher, FNP  aspirin 81 MG tablet Take 81 mg by mouth daily.   Yes Historical Provider, MD  carvedilol (COREG) 25 MG tablet TAKE ONE TABLET BY MOUTH TWICE DAILY WITH MEALS MUST  BE  SEEN  FOR  ADDITIONAL  REFILLS Patient taking differently: Take 25 mg by mouth 2 (two) times daily with a meal.  11/17/16  Yes Mary-Margaret Daphine Deutscher, FNP  citalopram (CELEXA) 20 MG tablet TAKE ONE TABLET BY MOUTH ONCE DAILY MUST  BE  SEEN  FOR  ADDITIONAL   REFILLS Patient taking differently: Take 20 mg by mouth daily.  11/17/16  Yes Mary-Margaret Daphine Deutscher, FNP  fenofibrate (TRICOR) 145 MG tablet Take 1 tablet (145 mg total) by mouth daily. 11/17/16  Yes Mary-Margaret Daphine Deutscher, FNP  furosemide (LASIX) 20 MG tablet TAKE TWO TABLETS BY MOUTH ONCE DAILY 12/15/16  Yes Mary-Margaret Daphine Deutscher, FNP  insulin NPH Human (NOVOLIN N RELION) 100 UNIT/ML injection Inject 0.24 mLs (24 Units total) into the skin daily before breakfast. 12/25/16  Yes Tammy Eckard, PharmD  levothyroxine (SYNTHROID, LEVOTHROID) 75 MCG tablet Take 1 tablet (75 mcg total) by mouth daily. 11/17/16  Yes Mary-Margaret Daphine Deutscher, FNP  lisinopril (PRINIVIL,ZESTRIL) 20 MG tablet Take 1 tablet (20 mg total) by mouth daily. 11/17/16  Yes Mary-Margaret Daphine Deutscher, FNP  meloxicam (MOBIC) 15 MG tablet TAKE ONE TABLET BY MOUTH ONCE DAILY 09/26/16  Yes Mary-Margaret Daphine Deutscher, FNP  memantine (NAMENDA) 10 MG tablet Take 1 tablet (10 mg total) by mouth 2 (two) times daily. 11/17/16  Yes Mary-Margaret Daphine Deutscher, FNP  metFORMIN (GLUCOPHAGE) 1000 MG tablet Take 1 tablet (1,000 mg total) by mouth 2 (two) times daily with a meal. 11/27/16  Yes Mary-Margaret Daphine Deutscher, FNP  omeprazole (PRILOSEC) 40 MG capsule Take 1 capsule (40 mg total) by mouth daily. 11/17/16  Yes Mary-Margaret Daphine Deutscher, FNP  potassium chloride (K-DUR) 10 MEQ tablet Take 2 tablets (20 mEq total) by mouth daily. 11/17/16  Yes Mary-Margaret Daphine Deutscher, FNP  donepezil (ARICEPT) 10 MG tablet Take 1 tablet (10 mg total) by mouth at bedtime. Patient not taking: Reported on 01/01/2017 11/17/16   Mary-Margaret Daphine Deutscher, FNP  doxycycline (VIBRA-TABS) 100 MG tablet Take 1 tablet (100 mg total) by mouth 2 (two) times daily. 1 po bid Patient not taking: Reported on 01/01/2017 11/12/16   Mary-Margaret Daphine Deutscher, FNP  fesoterodine (TOVIAZ) 8 MG TB24 tablet Take 1 tablet (8 mg total) by mouth daily. Patient not taking: Reported on 01/01/2017 11/23/16   Mary-Margaret Daphine Deutscher, FNP  [facility] VERIO test strip USE  TO CHECK GLUCOSE THREE TIMES DAILY 07/11/16   Mary-Margaret Daphine Deutscher, FNP    Family History Family History  Problem Relation Age of Onset  . Heart disease Father     Social History Social History  Substance Use Topics  . Smoking status: Never Smoker  . Smokeless tobacco: Never Used  . Alcohol use No     Allergies   Sulfa antibiotics   Review of Systems Review of Systems Unable to obtain due to dementia.     Physical Exam Updated Vital Signs BP (!) 115/49   Pulse 77   Temp 100.4 F (38 C) (Oral)   Resp 17   Ht 5' (1.524 m)   Wt 132 lb (59.9 kg)   LMP 02/04/1963   SpO2 98%   BMI 25.78 kg/m   Physical Exam Physical Exam  Nursing note and vitals reviewed. Constitutional: Well developed, well nourished, non-toxic, and in no acute distress Head: Normocephalic and atraumatic.  Mouth/Throat: Oropharynx is clear and moist.  Neck: Normal range of motion. Neck supple.  Cardiovascular: Normal rate and regular rhythm.   Pulmonary/Chest: Effort normal and breath sounds normal.  Abdominal: Soft. There is no tenderness. There is no rebound and no guarding.  Musculoskeletal: Normal range of motion.  Neurological: Alert, no facial droop, fluent speech, moves all extremities symmetrically Skin: Skin is warm and dry.  Psychiatric: Cooperative   ED Treatments / Results  DIAGNOSTIC STUDIES: Oxygen Saturation is 90 percent on room air which is adequate by my interpretation.    COORDINATION OF CARE: 9:00 PM Discussed treatment plan with pt at bedside and pt agreed to plan.  Labs (all labs ordered are listed, but only abnormal results are displayed) Labs Reviewed  COMPREHENSIVE METABOLIC PANEL - Abnormal; Notable for the following:       Result Value   Glucose, Bld 213 (*)    BUN 42 (*)    Creatinine, Ser 3.00 (*)    AST 51 (*)    Alkaline Phosphatase 25 (*)    GFR calc non Af Amer 13 (*)    GFR calc Af Amer 15 (*)    All other components within normal limits  CBC  WITH DIFFERENTIAL/PLATELET - Abnormal; Notable for the following:    Platelets 77 (*)    All other components within normal limits  PROTIME-INR - Abnormal; Notable for the following:    Prothrombin Time <10.0 (*)    All other components within normal limits  URINALYSIS, ROUTINE W REFLEX MICROSCOPIC - Abnormal; Notable for the following:    APPearance TURBID (*)    Hgb urine dipstick MODERATE (*)    Protein, ur 100 (*)    Leukocytes, UA MODERATE (*)    Bacteria, UA MANY (*)    All other components within normal limits  I-STAT CG4 LACTIC ACID, ED - Abnormal; Notable for the following:    Lactic Acid, Venous 3.67 (*)    All other components within normal limits  CULTURE, BLOOD (ROUTINE X 2)  CULTURE, BLOOD (ROUTINE X 2)  URINE CULTURE  INFLUENZA PANEL BY PCR (TYPE A & B)  I-STAT CG4 LACTIC ACID, ED    EKG  EKG Interpretation None       Radiology Dg Chest 2 View  Result Date: 01/01/2017 CLINICAL DATA:  Fever with low oxygen saturation EXAM: CHEST  2 VIEW COMPARISON:  05/29/2016 FINDINGS: There are low lung volumes. Tiny left pleural effusion with adjacent atelectasis. Mild cardiomegaly. No pneumothorax. Moderate compression deformity of thoracolumbar vertebra. IMPRESSION: Tiny left pleural effusion with adjacent atelectasis. Low lung volumes. Cardiomegaly without overt failure. Electronically Signed   By: Jasmine PangKim  Fujinaga M.D.   On: 01/01/2017 21:09    Procedures Procedures (including critical care time)  Medications Ordered in ED Medications  acetaminophen (TYLENOL) tablet 1,000 mg (1,000 mg Oral Given 01/01/17 2223)  sodium chloride 0.9 % bolus 1,000 mL (0 mLs Intravenous Stopped 01/01/17 2313)    And  sodium chloride 0.9 % bolus 1,000 mL (0 mLs Intravenous Stopped 01/01/17 2313)  piperacillin-tazobactam (ZOSYN) IVPB 3.375 g (0 g Intravenous Stopped 01/01/17 2313)  vancomycin (VANCOCIN) IVPB 1000 mg/200 mL premix (0 mg Intravenous Stopped 01/01/17 2313)     Initial Impression  / Assessment and Plan / ED Course  I have reviewed the triage vital signs and the nursing notes.  Pertinent labs & imaging results that were available during my care of the patient were reviewed by me and considered in my medical decision making (  see chart for details).     The patient is febrile on presentation with rectal temperature of 103. Is hemodynamically stable. Does require a little supplemental oxygen for low saturations in the low 90s. Looks very dry on exam. With soft and nontender abdomen.  A sepsis workup is pursued. No leukocytosis but elevated lactate of 3.6. With acute kidney injury and creatinine of 3.0 with baseline around 0.9-1.2. Received IV fluids per sepsis protocol. Empirically started on the vancomycin and Zosyn.  UA suspicious for urinary tract infection which is her likely source. Chest x-ray visualized and shows no infiltrate or other acute cardiopulmonary processes. Influenza testing is negative.  Discussed with Dr. Antionette Char who will admit for ongoing management.    Final Clinical Impressions(s) / ED Diagnoses   Final diagnoses:  Acute cystitis with hematuria  Sepsis, due to unspecified organism [facility])  Acute kidney injury (HCC)    New Prescriptions New Prescriptions   No medications on file   I personally performed the services described in this documentation, which was scribed in my presence. The recorded information has been reviewed and is accurate.    Lavera Guise, MD 01/01/17 [phone]

## 2017-01-01 NOTE — ED Notes (Signed)
MD notified patient possible septic

## 2017-01-01 NOTE — ED Triage Notes (Signed)
Patient with a history of dementia; has been vomiting for the past 3 days.  Also has diarrhea.  Was given Zofran 4 mg IV by EMS enroute to the ER.

## 2017-01-02 ENCOUNTER — Inpatient Hospital Stay (HOSPITAL_COMMUNITY): Payer: Medicare Other

## 2017-01-02 DIAGNOSIS — R7881 Bacteremia: Secondary | ICD-10-CM

## 2017-01-02 DIAGNOSIS — N3001 Acute cystitis with hematuria: Secondary | ICD-10-CM | POA: Insufficient documentation

## 2017-01-02 LAB — BASIC METABOLIC PANEL
ANION GAP: 8 (ref 5–15)
BUN: 46 mg/dL — AB (ref 6–20)
CALCIUM: 7.9 mg/dL — AB (ref 8.9–10.3)
CHLORIDE: 110 mmol/L (ref 101–111)
CO2: 22 mmol/L (ref 22–32)
Creatinine, Ser: 3.31 mg/dL — ABNORMAL HIGH (ref 0.44–1.00)
GFR calc Af Amer: 14 mL/min — ABNORMAL LOW (ref >60)
GFR calc non Af Amer: 12 mL/min — ABNORMAL LOW (ref >60)
GLUCOSE: 105 mg/dL — AB (ref 65–99)
POTASSIUM: 4 mmol/L (ref 3.5–5.1)
SODIUM: 140 mmol/L (ref 135–145)

## 2017-01-02 LAB — CBC
HCT: 30.7 % — ABNORMAL LOW (ref 36.0–46.0)
Hemoglobin: 9.8 g/dL — ABNORMAL LOW (ref 12.0–15.0)
MCH: 30.5 pg (ref 26.0–34.0)
MCHC: 31.9 g/dL (ref 30.0–36.0)
MCV: 95.6 fL (ref 78.0–100.0)
PLATELETS: 54 10*3/uL — AB (ref 150–400)
RBC: 3.21 MIL/uL — AB (ref 3.87–5.11)
RDW: 16.1 % — AB (ref 11.5–15.5)
WBC: 5.1 10*3/uL (ref 4.0–10.5)

## 2017-01-02 LAB — BLOOD CULTURE ID PANEL (REFLEXED)
Acinetobacter baumannii: NOT DETECTED
CANDIDA KRUSEI: NOT DETECTED
CANDIDA PARAPSILOSIS: NOT DETECTED
CANDIDA TROPICALIS: NOT DETECTED
CARBAPENEM RESISTANCE: NOT DETECTED
Candida albicans: NOT DETECTED
Candida glabrata: NOT DETECTED
ENTEROCOCCUS SPECIES: NOT DETECTED
Enterobacter cloacae complex: NOT DETECTED
Enterobacteriaceae species: DETECTED — AB
Escherichia coli: DETECTED — AB
Haemophilus influenzae: NOT DETECTED
KLEBSIELLA OXYTOCA: NOT DETECTED
KLEBSIELLA PNEUMONIAE: NOT DETECTED
Listeria monocytogenes: NOT DETECTED
Neisseria meningitidis: NOT DETECTED
PROTEUS SPECIES: NOT DETECTED
Pseudomonas aeruginosa: NOT DETECTED
SERRATIA MARCESCENS: NOT DETECTED
STAPHYLOCOCCUS AUREUS BCID: NOT DETECTED
STAPHYLOCOCCUS SPECIES: NOT DETECTED
Streptococcus agalactiae: NOT DETECTED
Streptococcus pneumoniae: NOT DETECTED
Streptococcus pyogenes: NOT DETECTED
Streptococcus species: NOT DETECTED

## 2017-01-02 LAB — COMPREHENSIVE METABOLIC PANEL
ALBUMIN: 2.6 g/dL — AB (ref 3.5–5.0)
ALT: 20 U/L (ref 14–54)
ANION GAP: 10 (ref 5–15)
AST: 43 U/L — ABNORMAL HIGH (ref 15–41)
Alkaline Phosphatase: 19 U/L — ABNORMAL LOW (ref 38–126)
BUN: 42 mg/dL — ABNORMAL HIGH (ref 6–20)
CALCIUM: 8.1 mg/dL — AB (ref 8.9–10.3)
CHLORIDE: 109 mmol/L (ref 101–111)
CO2: 22 mmol/L (ref 22–32)
Creatinine, Ser: 2.97 mg/dL — ABNORMAL HIGH (ref 0.44–1.00)
GFR calc non Af Amer: 13 mL/min — ABNORMAL LOW (ref >60)
GFR, EST AFRICAN AMERICAN: 16 mL/min — AB (ref >60)
GLUCOSE: 184 mg/dL — AB (ref 65–99)
POTASSIUM: 4.4 mmol/L (ref 3.5–5.1)
SODIUM: 141 mmol/L (ref 135–145)
Total Bilirubin: 0.6 mg/dL (ref 0.3–1.2)
Total Protein: 5 g/dL — ABNORMAL LOW (ref 6.5–8.1)

## 2017-01-02 LAB — CBC WITH DIFFERENTIAL/PLATELET
BASOS ABS: 0 10*3/uL (ref 0.0–0.1)
BASOS PCT: 0 %
EOS ABS: 0 10*3/uL (ref 0.0–0.7)
EOS PCT: 0 %
HCT: 29.7 % — ABNORMAL LOW (ref 36.0–46.0)
HEMOGLOBIN: 9.9 g/dL — AB (ref 12.0–15.0)
Lymphocytes Relative: 22 %
Lymphs Abs: 1 10*3/uL (ref 0.7–4.0)
MCH: 31.4 pg (ref 26.0–34.0)
MCHC: 33.3 g/dL (ref 30.0–36.0)
MCV: 94.3 fL (ref 78.0–100.0)
MONO ABS: 0.2 10*3/uL (ref 0.1–1.0)
MONOS PCT: 5 %
NEUTROS ABS: 3.6 10*3/uL (ref 1.7–7.7)
Neutrophils Relative %: 74 %
PLATELETS: 56 10*3/uL — AB (ref 150–400)
RBC: 3.15 MIL/uL — ABNORMAL LOW (ref 3.87–5.11)
RDW: 15.7 % — AB (ref 11.5–15.5)
WBC: 4.8 10*3/uL (ref 4.0–10.5)

## 2017-01-02 LAB — GLUCOSE, CAPILLARY
GLUCOSE-CAPILLARY: 178 mg/dL — AB (ref 65–99)
GLUCOSE-CAPILLARY: 152 mg/dL — AB (ref 65–99)
GLUCOSE-CAPILLARY: 105 mg/dL — AB (ref 65–99)
Glucose-Capillary: 83 mg/dL (ref 65–99)
Glucose-Capillary: 96 mg/dL (ref 65–99)
Glucose-Capillary: 93 mg/dL (ref 65–99)

## 2017-01-02 LAB — C DIFFICILE QUICK SCREEN W PCR REFLEX
C DIFFICLE (CDIFF) ANTIGEN: NEGATIVE
C Diff interpretation: NOT DETECTED
C Diff toxin: NEGATIVE

## 2017-01-02 LAB — LACTIC ACID, PLASMA
LACTIC ACID, VENOUS: 2.2 mmol/L — AB (ref 0.5–1.9)
Lactic Acid, Venous: 2.5 mmol/L (ref 0.5–1.9)

## 2017-01-02 MED ORDER — DEXTROSE 5 % IV SOLN
2.0000 g | INTRAVENOUS | Status: DC
  Administered 2017-01-03 – 2017-01-06 (×4): 2 g via INTRAVENOUS
  Filled 2017-01-02 (×6): qty 2

## 2017-01-02 MED ORDER — INSULIN GLARGINE 100 UNIT/ML ~~LOC~~ SOLN
10.0000 [IU] | Freq: Every day | SUBCUTANEOUS | Status: DC
  Administered 2017-01-02 – 2017-01-04 (×3): 10 [IU] via SUBCUTANEOUS
  Filled 2017-01-02 (×3): qty 0.1

## 2017-01-02 MED ORDER — DEXTROSE 5 % IV SOLN
1.0000 g | INTRAVENOUS | Status: DC
  Filled 2017-01-02: qty 10

## 2017-01-02 MED ORDER — DEXTROSE 5 % IV SOLN
INTRAVENOUS | Status: AC
  Filled 2017-01-02: qty 2

## 2017-01-02 NOTE — Progress Notes (Signed)
Notified from lab about two gram negative bacteria results.

## 2017-01-02 NOTE — Progress Notes (Addendum)
Pharmacy Antibiotic Note  Glenford Peershyllis D Beaudin is a 83 y.o. female admitted on 01/01/2017 with UTI/ bacteremia.  Pharmacy has been consulted for rocephin dosing.  Plan: Ceftriaxone 2gm IV q24h F/U cxs and clinical progress  Height: 5' (152.4 cm) Weight: 133 lb 4.8 oz (60.5 kg) IBW/kg (Calculated) : 45.5  Temp (24hrs), Avg:100.1 F (37.8 C), Min:97.5 F (36.4 C), Max:103.7 F (39.8 C)   Recent Labs Lab 01/01/17 2133 01/01/17 2150 01/01/17 2342 01/02/17 0211 01/02/17 0547  WBC 7.3  --   --  4.8  --   CREATININE 3.00*  --   --  2.97*  --   LATICACIDVEN  --  3.67* 2.68* 2.5* 2.2*    Estimated Creatinine Clearance: 11.5 mL/min (by C-G formula based on SCr of 2.97 mg/dL (H)).    Allergies  Allergen Reactions  . Sulfa Antibiotics Rash    Antimicrobials this admission: Ceftriaxone 2/20>>   Microbiology results: 2/19 BCx: GNR 2/19 UCx: pending  Thank you for allowing pharmacy to be a part of this patient's care.  Elder CyphersLorie Forney Kleinpeter, BS Pharm D, BCPS Clinical Pharmacist Pager [phone] 01/02/2017 9:10 AM

## 2017-01-02 NOTE — Care Management Note (Signed)
Case Management Note  Patient Details  Name: Jamie Cochran MRN: [id number] Date of Birth: Feb 14, 1930  Subjective/Objective:                  Patient adm from home with sepsis secondary to UTI. She is from home with granddaughter. CM spoke with granddaughter over phone. No DME/HH PTA. PT recommends HH PT. Family agreeable, wants to use [facility].   Action/Plan: Alroy [facility]ailiffLinda Lothian [facility] notified and will obtain orders from chart. [facility].   Expected Discharge Date:     01/05/2017          Expected Discharge Plan:  Home w Home Health Services  In-House Referral:     Discharge planning Services  CM Consult  Post Acute Care Choice:    Choice offered to:     DME Arranged:    DME Agency:     HH Arranged:    HH Agency:     Status of Service:  In process, will continue to follow  If discussed at Long Length of Stay Meetings, dates discussed:    Additional Comments:  Taylon Louison, Chrystine OilerSharley Diane, RN 01/02/2017, 2:23 PM

## 2017-01-02 NOTE — Evaluation (Signed)
Physical Therapy Evaluation Patient Details Name: CHRISTIANE SISTARE MRN: [id number] DOB: May 20, 1930 Today's Date: 01/02/2017   History of Present Illness  83 y.o. female with medical history significant for dementia, chronic diastolic CHF, hypothyroidism, hypertension, insulin-dependent diabetes mellitus, and recurrent UTI who presents to the emergency department with watery diarrhea, chills, food refusal, and agitation. Patient was reportedly in her usual state of health until yesterday evening when she developed watery diarrhea, experiencing roughly 3 episodes. She was also noted to have shaking chills last night before going to bed. She seemed to have improved this morning, but then went on to have numerous episodes of watery diarrhea throughout the day. She also had recurrence in the shaking chills, but had no pain complaints and no vomiting or cough. Patient refused food or drink throughout the day today and began to become agitated with her granddaughter this evening. She was brought in for evaluation of this and has not attempted any interventions at home prior to coming in. Granddaughter reports that the patient has had recurrent UTIs and was treated with antibiotics a couple months ago. They do not know of any history of C. difficile.  Clinical Impression  Pt received in bed, and was agreeable to PT evaluation.  She is very HOH, therefore obtaining PLOF was difficult and most of the information was obtained from chart review.  During PT evaluation, she was able to transfer bed<>BSC with RW and min guard.  Gait distance was limited due to need to use BSC and have BM.  Recommend that pt have HHPT upon d/c.      Follow Up Recommendations Home health PT;Supervision/Assistance - 24 hour    Equipment Recommendations  None recommended by PT    Recommendations for Other Services       Precautions / Restrictions Precautions Precautions: Fall Restrictions Weight Bearing Restrictions: No       Mobility  Bed Mobility Overal bed mobility: Needs Assistance Bed Mobility: Supine to Sit     Supine to sit: Min guard;HOB elevated        Transfers Overall transfer level: Needs assistance Equipment used: Rolling walker (2 wheeled) Transfers: Sit to/from [employer] Sit to Stand: Min guard Stand pivot transfers: Min guard       General transfer comment: Pt requires assistance for hygiene after toileting.   Ambulation/Gait Ambulation/Gait assistance: Min guard Ambulation Distance (Feet): 5 Feet Assistive device: Rolling walker (2 wheeled) Gait Pattern/deviations: Step-through pattern;Trunk flexed     General Gait Details: Limitetd distance due to pt needing to have a BM.  Pt was assisted over to the [facility].   Stairs            Wheelchair Mobility    Modified Rankin (Stroke Patients Only)       Balance Overall balance assessment: Needs assistance Sitting-balance support: Bilateral upper extremity supported;Feet supported Sitting balance-Leahy Scale: Good     Standing balance support: Bilateral upper extremity supported Standing balance-Leahy Scale: Fair                               Pertinent Vitals/Pain Pain Assessment: No/denies pain    Home Living   Living Arrangements: Children Available Help at Discharge: Family;Available 24 hours/day Type of Home: House Home Access: Stairs to enter Entrance Stairs-Rails: Right Entrance Stairs-Number of Steps: 2-3 Home Layout: One level Home Equipment: Walker - 2 wheels;Bedside commode;Wheelchair - manual      Prior Function  Comments: No family present to confirm PLOF.      Hand Dominance   Dominant Hand: Right    Extremity/Trunk Assessment   Upper Extremity Assessment Upper Extremity Assessment: Overall WFL for tasks assessed    Lower Extremity Assessment Lower Extremity Assessment: Generalized weakness       Communication   Communication: HOH   Cognition Arousal/Alertness: Awake/alert Behavior During Therapy: WFL for tasks assessed/performed Overall Cognitive Status: Difficult to assess                      General Comments      Exercises     Assessment/Plan    PT Assessment Patient needs continued PT services  PT Problem List Decreased strength;Decreased activity tolerance;Decreased balance;Decreased mobility;Decreased safety awareness       PT Treatment Interventions DME instruction;Gait training;Functional mobility training;Therapeutic activities;Therapeutic exercise;Balance training;Patient/family education    PT Goals (Current goals can be found in the Care Plan section)  Acute Rehab PT Goals Patient Stated Goal: None stated PT Goal Formulation: With patient Time For Goal Achievement: 01/09/17 Potential to Achieve Goals: Good    Frequency Min 3X/week   Barriers to discharge        Co-evaluation               End of Session Equipment Utilized During Treatment: Gait belt;Oxygen Activity Tolerance: Patient tolerated treatment well Patient left: in chair;with call bell/phone within reach;with chair alarm set Nurse Communication: Mobility status (Mobility sheet left hanging in the room. ) PT Visit Diagnosis: Muscle weakness (generalized) (M62.81);Other abnormalities of gait and mobility (R26.89)    Functional Assessment Tool Used: AM-PAC 6 Clicks Basic Mobility;Clinical judgement Functional Limitation: Mobility: Walking and moving around Mobility: Walking and Moving Around Current Status ([id number](G8978): At least 20 percent but less than 40 percent impaired, limited or restricted Mobility: Walking and Moving Around Goal Status [phone](G8979): At least 1 percent but less than 20 percent impaired, limited or restricted    Time: 1115-1155 PT Time Calculation (min) (ACUTE ONLY): 40 min   Charges:   PT Evaluation $PT Eval Low Complexity: 1 Procedure PT Treatments $Therapeutic Activity: 8-22 mins   PT G  Codes:   PT G-Codes **NOT FOR INPATIENT CLASS** Functional Assessment Tool Used: AM-PAC 6 Clicks Basic Mobility;Clinical judgement Functional Limitation: Mobility: Walking and moving around Mobility: Walking and Moving Around Current Status ([id number](G8978): At least 20 percent but less than 40 percent impaired, limited or restricted Mobility: Walking and Moving Around Goal Status [phone](G8979): At least 1 percent but less than 20 percent impaired, limited or restricted     Beth Deannah Rossi, PT, DPT X: [phone]

## 2017-01-02 NOTE — Progress Notes (Signed)
PHARMACY - PHYSICIAN COMMUNICATION CRITICAL VALUE ALERT - BLOOD CULTURE IDENTIFICATION (BCID)  Results for orders placed or performed during the hospital encounter of 01/01/17  Blood Culture ID Panel (Reflexed) (Collected: 01/01/2017 10:01 PM)  Result Value Ref Range   Enterococcus species NOT DETECTED NOT DETECTED   Listeria monocytogenes NOT DETECTED NOT DETECTED   Staphylococcus species NOT DETECTED NOT DETECTED   Staphylococcus aureus NOT DETECTED NOT DETECTED   Streptococcus species NOT DETECTED NOT DETECTED   Streptococcus agalactiae NOT DETECTED NOT DETECTED   Streptococcus pneumoniae NOT DETECTED NOT DETECTED   Streptococcus pyogenes NOT DETECTED NOT DETECTED   Acinetobacter baumannii NOT DETECTED NOT DETECTED   Enterobacteriaceae species DETECTED (A) NOT DETECTED   Enterobacter cloacae complex NOT DETECTED NOT DETECTED   Escherichia coli DETECTED (A) NOT DETECTED   Klebsiella oxytoca NOT DETECTED NOT DETECTED   Klebsiella pneumoniae NOT DETECTED NOT DETECTED   Proteus species NOT DETECTED NOT DETECTED   Serratia marcescens NOT DETECTED NOT DETECTED   Carbapenem resistance NOT DETECTED NOT DETECTED   Haemophilus influenzae NOT DETECTED NOT DETECTED   Neisseria meningitidis NOT DETECTED NOT DETECTED   Pseudomonas aeruginosa NOT DETECTED NOT DETECTED   Candida albicans NOT DETECTED NOT DETECTED   Candida glabrata NOT DETECTED NOT DETECTED   Candida krusei NOT DETECTED NOT DETECTED   Candida parapsilosis NOT DETECTED NOT DETECTED   Candida tropicalis NOT DETECTED NOT DETECTED    Name of physician (or Provider) Contacted:   Changes to prescribed antibiotics required: None needed.  Cont rocephin 2 gm IV q24 hours  Jamie Cochran, Jamie Cochran 01/02/2017  4:36 PM

## 2017-01-02 NOTE — Progress Notes (Signed)
PROGRESS NOTE  BELEM HINTZE ZOX:[id number] DOB: 05-09-1930 DOA: 01/01/2017 PCP: Bennie Pierini, FNP  Brief Narrative: 83 year old woman presented with watery diarrhea, chills, agitation, refusal to eat or drink. PMH recurrent UTIs. Initial evaluation revealed fever, lactic acidosis, acute kidney injury, thrombocytopenia, UTI. Treated and admitted for sepsis secondary to UTI.  Assessment/Plan 1. Sepsis secondary to gram-negative rod, resuming from UTI with associated fever, lactic acidosis, thrombocytopenia and acute kidney injury on admission.  Continue empiric antibiotics. Follow-up culture data. 2. Gram-negative rod bacteremia. Treatment as above. 3. Acute kidney injury. Slightly worse today. Likely prerenal azotemia from poor oral intake exacerbated by Lasix, lisinopril. Consider urinary retention. Consider ATN from sepsis.  Strict I/O, check renal ultrasound, aggressive IV fluids. Monitor for volume overload. Place Foley catheter. 4. Thrombocytopenia secondary to sepsis. Appears to be stabilizing. 5. V/D. Appears resolved. Abdominal exam benign. 6. Dementia , without behavioral disturbance. 7. PMH chronic diastolic congestive heart failure, hypertension, hypothyroidism 8. Diabetes mellitus on insulin, blood sugar stable. A 9 gap normal.   Patient appears clinically well despite above. Hemodynamics are stable, no recurrent fever. Continue antibiotics, fluids, further evaluation for acute kidney injury. Repeat BMP and CBC in the morning.  DVT prophylaxis: SCDs Code Status: DNR Family Communication: none Disposition Plan: pending   Brendia Sacks, MD  [facility] Direct contact: [phone] --Via amion app OR  --[email or web address]; password [email or web address]  7PM-7AM contact night coverage as above 01/02/2017, 4:17 PM  LOS: 1 day   Consultants:    Procedures:    Antimicrobials:  Ceftriaxone 2/20 >>  Interval history/Subjective: Feels well, no complaints. No  nausea or vomiting. No abdominal pain. History may be unreliable given dementia. Complains of low blood pressure.  Objective: Vitals:   01/02/17 0048 01/02/17 0335 01/02/17 0500 01/02/17 1520  BP: (!) 109/50  (!) 114/46 (!) 116/54  Pulse: 70  73 76  Resp: 16  16 18   Temp: 98.2 F (36.8 C)  97.5 F (36.4 C) 98.2 F (36.8 C)  TempSrc: Axillary  Axillary Oral  SpO2: 99%  98% 97%  Weight: 59.5 kg (131 lb 3.2 oz) 60.5 kg (133 lb 4.8 oz)    Height:        Intake/Output Summary (Last 24 hours) at 01/02/17 1617 Last data filed at 01/02/17 0900  Gross per 24 hour  Intake           3308.5 ml  Output                0 ml  Net           3308.5 ml     Filed Weights   01/01/17 2118 01/02/17 0048 01/02/17 0335  Weight: 59.9 kg (132 lb) 59.5 kg (131 lb 3.2 oz) 60.5 kg (133 lb 4.8 oz)    Exam:    Constitutional:   Appears calm and comfortable lying in bed, nontoxic  Cardiovascular regular rate and rhythm. No murmur, rub or gallop. No lower extremity edema.  Respiratory clear to auscultation bilaterally. No wheezes, rales or rhonchi. Normal respiratory effort.  Abdomen soft, nontender, nondistended, positive bowel sounds  Skin appears unremarkable, well perfused. No petechiae or ecchymosis.  Psychiatric grossly normal mood and affect, speech is fluent but not appropriate.  Musculoskeletal moves all extremities well.   I have personally reviewed following labs and imaging studies:  Urine output imprecise  Basic metabolic panel notable for elevated BUN and creatinine, 46/3.31. Potassium normal. Lactic acid 2.2, trending down  Hemoglobin stable 9.8  Platelet count 54,  stable. No leukocytosis.  Blood cultures 2/2 gram-negative rods  Scheduled Meds: . [START ON 01/03/2017] cefTRIAXone (ROCEPHIN)  IV  2 g Intravenous Q24H  . citalopram  20 mg Oral Daily  . insulin aspart  0-15 Units Subcutaneous Q4H  . insulin glargine  10 Units Subcutaneous QHS  . levothyroxine  75 mcg Oral  QAC breakfast  . memantine  10 mg Oral BID  . pantoprazole  40 mg Oral Daily   Continuous Infusions: . sodium chloride 150 mL/hr at 01/02/17 [id number]    Principal Problem:   Sepsis secondary to UTI [facility](HCC) Active Problems:   Hypertension   Hypothyroidism   Diabetes mellitus type 2, controlled (HCC)   Chronic diastolic CHF (congestive heart failure) (HCC)   Dementia   Acute kidney injury (HCC)   Watery diarrhea   Thrombocytopenia (HCC)   Bacteremia   LOS: 1 day

## 2017-01-03 ENCOUNTER — Inpatient Hospital Stay (HOSPITAL_COMMUNITY): Payer: Medicare Other

## 2017-01-03 DIAGNOSIS — R7881 Bacteremia: Secondary | ICD-10-CM

## 2017-01-03 DIAGNOSIS — N179 Acute kidney failure, unspecified: Secondary | ICD-10-CM

## 2017-01-03 DIAGNOSIS — D696 Thrombocytopenia, unspecified: Secondary | ICD-10-CM

## 2017-01-03 DIAGNOSIS — E119 Type 2 diabetes mellitus without complications: Secondary | ICD-10-CM

## 2017-01-03 DIAGNOSIS — F0390 Unspecified dementia without behavioral disturbance: Secondary | ICD-10-CM

## 2017-01-03 DIAGNOSIS — Z794 Long term (current) use of insulin: Secondary | ICD-10-CM

## 2017-01-03 DIAGNOSIS — A419 Sepsis, unspecified organism: Secondary | ICD-10-CM

## 2017-01-03 DIAGNOSIS — N390 Urinary tract infection, site not specified: Secondary | ICD-10-CM

## 2017-01-03 DIAGNOSIS — I5032 Chronic diastolic (congestive) heart failure: Secondary | ICD-10-CM

## 2017-01-03 LAB — GASTROINTESTINAL PANEL BY PCR, STOOL (REPLACES STOOL CULTURE)
ASTROVIRUS: NOT DETECTED
Adenovirus F40/41: NOT DETECTED
CAMPYLOBACTER SPECIES: NOT DETECTED
Cryptosporidium: NOT DETECTED
Cyclospora cayetanensis: NOT DETECTED
ENTAMOEBA HISTOLYTICA: NOT DETECTED
ENTEROTOXIGENIC E COLI (ETEC): NOT DETECTED
Enteroaggregative E coli (EAEC): NOT DETECTED
Enteropathogenic E coli (EPEC): DETECTED — AB
Giardia lamblia: NOT DETECTED
NOROVIRUS GI/GII: NOT DETECTED
Plesimonas shigelloides: NOT DETECTED
ROTAVIRUS A: NOT DETECTED
SAPOVIRUS (I, II, IV, AND V): NOT DETECTED
SHIGA LIKE TOXIN PRODUCING E COLI (STEC): NOT DETECTED
SHIGELLA/ENTEROINVASIVE E COLI (EIEC): NOT DETECTED
Salmonella species: NOT DETECTED
VIBRIO CHOLERAE: NOT DETECTED
Vibrio species: NOT DETECTED
Yersinia enterocolitica: NOT DETECTED

## 2017-01-03 LAB — BASIC METABOLIC PANEL
ANION GAP: 9 (ref 5–15)
BUN: 55 mg/dL — ABNORMAL HIGH (ref 6–20)
CALCIUM: 7.4 mg/dL — AB (ref 8.9–10.3)
CHLORIDE: 113 mmol/L — AB (ref 101–111)
CO2: 20 mmol/L — ABNORMAL LOW (ref 22–32)
CREATININE: 3.44 mg/dL — AB (ref 0.44–1.00)
GFR calc non Af Amer: 11 mL/min — ABNORMAL LOW (ref >60)
GFR, EST AFRICAN AMERICAN: 13 mL/min — AB (ref >60)
Glucose, Bld: 121 mg/dL — ABNORMAL HIGH (ref 65–99)
Potassium: 4.5 mmol/L (ref 3.5–5.1)
SODIUM: 142 mmol/L (ref 135–145)

## 2017-01-03 LAB — HEMOGLOBIN A1C
HEMOGLOBIN A1C: 5.4 % (ref 4.8–5.6)
MEAN PLASMA GLUCOSE: 108

## 2017-01-03 LAB — CBC
HCT: 32.2 % — ABNORMAL LOW (ref 36.0–46.0)
Hemoglobin: 10 g/dL — ABNORMAL LOW (ref 12.0–15.0)
MCH: 30.4 pg (ref 26.0–34.0)
MCHC: 31.1 g/dL (ref 30.0–36.0)
MCV: 97.9 fL (ref 78.0–100.0)
PLATELETS: 53 10*3/uL — AB (ref 150–400)
RBC: 3.29 MIL/uL — AB (ref 3.87–5.11)
RDW: 15.8 % — ABNORMAL HIGH (ref 11.5–15.5)
WBC: 6.4 10*3/uL (ref 4.0–10.5)

## 2017-01-03 LAB — GLUCOSE, CAPILLARY
GLUCOSE-CAPILLARY: 115 mg/dL — AB (ref 65–99)
GLUCOSE-CAPILLARY: 153 mg/dL — AB (ref 65–99)
GLUCOSE-CAPILLARY: 77 mg/dL (ref 65–99)
Glucose-Capillary: 141 mg/dL — ABNORMAL HIGH (ref 65–99)
Glucose-Capillary: 80 mg/dL (ref 65–99)
Glucose-Capillary: 82 mg/dL (ref 65–99)

## 2017-01-03 LAB — LACTATE DEHYDROGENASE: LDH: 241 U/L — ABNORMAL HIGH (ref 98–192)

## 2017-01-03 LAB — CREATININE, URINE, RANDOM: Creatinine, Urine: 89.83 mg/dL

## 2017-01-03 LAB — SODIUM, URINE, RANDOM: Sodium, Ur: 40 mmol/L

## 2017-01-03 LAB — SAVE SMEAR

## 2017-01-03 LAB — DIRECT ANTIGLOBULIN TEST (NOT AT ARMC)
DAT, IgG: NEGATIVE
DAT, complement: NEGATIVE

## 2017-01-03 MED ORDER — SODIUM CHLORIDE 0.9 % IV SOLN
INTRAVENOUS | Status: DC
  Administered 2017-01-03 – 2017-01-04 (×2): via INTRAVENOUS

## 2017-01-03 MED ORDER — IPRATROPIUM-ALBUTEROL 0.5-2.5 (3) MG/3ML IN SOLN
3.0000 mL | Freq: Four times a day (QID) | RESPIRATORY_TRACT | Status: DC
  Administered 2017-01-03 – 2017-01-06 (×12): 3 mL via RESPIRATORY_TRACT
  Filled 2017-01-03 (×11): qty 3

## 2017-01-03 NOTE — Consult Note (Addendum)
Glenford Peershyllis D Kolasa MRN: [id number] DOB/AGE: Jul 29, 1930 83 y.o. Primary Care Physician:Mary-Margaret Daphine DeutscherMartin, FNP Admit date: 01/01/2017 Chief Complaint:  Chief Complaint  Patient presents with  . Emesis   HPI: Pt is a 83 y.o. female with past medical history significant for dementia, diabetes mellitus and recurrent UTI who presented to the emergency department with c/o emesis.  HPI dates back to  12/31/16 when pt had sudden onset of watery diarrhea, around 3 episodes.. Pt was  also noted to have shaking chills last night before going to bed. . Patient later refused food or drink throughout the day and became agitated w. Pt  was brought in for evaluation . Upon evaluation in ER patient is found to be febrile and  tachypneic. Pt had AKI with Creatinine of 3.00. Pt also had  Lactic acidosis . Pt washe will be admitted to the medical/surgical unit for ongoing evaluation and management of sepsis secondary to UTI with watery diarrhea concerning for possible C. Difficile. Pt blood culture late came back positive for gram negative rods. Pt seen today . Pt offers no specific complaints. Pt just says " I am okay"   Past Medical History:  Diagnosis Date  . CHF (congestive heart failure) (HCC)   . Dementia   . Diabetes type 2, uncontrolled (HCC)   . Hearing loss    history of right sided cholesteotoma s/p Right tympanoplasty with ossicular reconstruction. (03/2002)  . Hyperlipidemia   . Hypertension   . Hypothyroidism   . Non-occlusive coronary artery disease    40% first diagonal stenosis, 50% obtuse marginal stenosis  . Osteoporosis   . Stroke (HCC) in 05/2012, ?   deep right frontal perventricular deep white matter infarct + subacute left thalamic lacunar infarct  . Urinary incontinence    s/p surgery for ureterocele  . UTI (lower urinary tract infection) 09/2015   granddaughter reports "constant UTIs"        Family History  Problem Relation Age of Onset  . Heart disease  Father     Social History:  reports that she has never smoked. She has never used smokeless tobacco. She reports that she does not drink alcohol or use drugs.   Allergies:  Allergies  Allergen Reactions  . Sulfa Antibiotics Rash    Medications Prior to Admission  Medication Sig Dispense Refill  . amLODipine (NORVASC) 5 MG tablet TAKE ONE TABLET BY MOUTH ONCE DAILY MUST  BE  SEEN  FOR  ADDITIONAL  REFILLS (Patient taking differently: Take 5 mg by mouth daily. TAKE ONE TABLET BY MOUTH ONCE DAILY MUST  BE  SEEN  FOR  ADDITIONAL  REFILLS) 30 tablet 5  . aspirin 81 MG tablet Take 81 mg by mouth daily.    . carvedilol (COREG) 25 MG tablet TAKE ONE TABLET BY MOUTH TWICE DAILY WITH MEALS MUST  BE  SEEN  FOR  ADDITIONAL  REFILLS (Patient taking differently: Take 25 mg by mouth 2 (two) times daily with a meal. ) 60 tablet 5  . citalopram (CELEXA) 20 MG tablet TAKE ONE TABLET BY MOUTH ONCE DAILY MUST  BE  SEEN  FOR  ADDITIONAL  REFILLS (Patient taking differently: Take 20 mg by mouth daily. ) 30 tablet 5  . fenofibrate (TRICOR) 145 MG tablet Take 1 tablet (145 mg total) by mouth daily. 30 tablet 5  . furosemide (LASIX) 20 MG tablet TAKE TWO TABLETS BY MOUTH ONCE DAILY 60 tablet 5  . insulin NPH Human (NOVOLIN N RELION) 100 UNIT/ML injection Inject  0.24 mLs (24 Units total) into the skin daily before breakfast. 10 mL 2  . levothyroxine (SYNTHROID, LEVOTHROID) 75 MCG tablet Take 1 tablet (75 mcg total) by mouth daily. 30 tablet 5  . lisinopril (PRINIVIL,ZESTRIL) 20 MG tablet Take 1 tablet (20 mg total) by mouth daily. 30 tablet 5  . meloxicam (MOBIC) 15 MG tablet TAKE ONE TABLET BY MOUTH ONCE DAILY 30 tablet 4  . memantine (NAMENDA) 10 MG tablet Take 1 tablet (10 mg total) by mouth 2 (two) times daily. 30 tablet 5  . metFORMIN (GLUCOPHAGE) 1000 MG tablet Take 1 tablet (1,000 mg total) by mouth 2 (two) times daily with a meal. 60 tablet 3  . omeprazole (PRILOSEC) 40 MG capsule Take 1 capsule (40 mg  total) by mouth daily. 30 capsule 5  . potassium chloride (K-DUR) 10 MEQ tablet Take 2 tablets (20 mEq total) by mouth daily. 60 tablet 5  . fesoterodine (TOVIAZ) 8 MG TB24 tablet Take 1 tablet (8 mg total) by mouth daily. (Patient not taking: Reported on 01/01/2017) 30 tablet 5  . ONETOUCH VERIO test strip USE TO CHECK GLUCOSE THREE TIMES DAILY 100 each 5       ZOX:WRUEA from the symptoms mentioned above,there are no other symptoms referable to all systems reviewed.  . cefTRIAXone (ROCEPHIN)  IV  2 g Intravenous Q24H  . citalopram  20 mg Oral Daily  . insulin aspart  0-15 Units Subcutaneous Q4H  . insulin glargine  10 Units Subcutaneous QHS  . levothyroxine  75 mcg Oral QAC breakfast  . memantine  10 mg Oral BID  . pantoprazole  40 mg Oral Daily      Physical Exam: Vital signs in last 24 hours: Temp:  [98 F (36.7 C)-98.6 F (37 C)] 98.6 F (37 C) (02/21 0417) Pulse Rate:  [74-78] 74 (02/21 0417) Resp:  [18] 18 (02/21 0417) BP: (116-183)/(54-73) 153/73 (02/21 0417) SpO2:  [97 %-98 %] 97 % (02/21 0417) Weight:  [136 lb 4.8 oz (61.8 kg)] 136 lb 4.8 oz (61.8 kg) (02/21 0417) Weight change: 4 lb 4.8 oz (1.95 kg) Last BM Date: 01/01/17  Intake/Output from previous day: 02/20 0701 - 02/21 0700 In: 1741.8 [P.O.:161; I.V.:1580.8] Out: 402 [Urine:400; Stool:2] No intake/output data recorded.   Physical Exam: General- pt is awake,follows commands intermittently. Resp- No acute REsp distress,  NO Rhonchi CVS- S1S2 regular in rate and rhythm GIT- BS+, soft, NT, distended EXT- NO LE Edema, NO Cyanosis CNS- CN 2-12 grossly intact. Moving all 4 extremities Psych- normal mod and affect    Lab Results: CBC  Recent Labs  01/02/17 0547 01/03/17 0525  WBC 5.1 6.4  HGB 9.8* 10.0*  HCT 30.7* 32.2*  PLT 54* 53*    BMET  Recent Labs  01/02/17 0547 01/03/17 0525  NA 140 142  K 4.0 4.5  CL 110 113*  CO2 22 20*  GLUCOSE 105* 121*  BUN 46* 55*  CREATININE 3.31*  3.44*  CALCIUM 7.9* 7.4*   Creat trend 2018  3.0=>3.3=>3.4 2017  0.8--1.2 2016   0.7--1.2    Hb trend 10.5=>12.6=>10  Plts trend 149=>77=>53    MICRO Recent Results (from the past 240 hour(s))  Culture, blood (Routine x 2)     Status: None (Preliminary result)   Collection Time: 01/01/17 10:01 PM  Result Value Ref Range Status   Specimen Description BLOOD RIGHT ARM  Final   Special Requests BOTTLES DRAWN AEROBIC AND ANAEROBIC [facility] EACH  Final   Culture  Setup Time   Final    GRAM NEGATIVE RODS Gram Stain Report Called to,Read Back By and Verified With: FOLEY B. AT 0950 ON [id number] BY THOMPSON S. Organism ID to follow Performed at [facility], [address] [place], [place] [address]    Culture PENDING  Incomplete   Report Status PENDING  Incomplete  Blood Culture ID Panel (Reflexed)     Status: Abnormal   Collection Time: 01/01/17 10:01 PM  Result Value Ref Range Status   Enterococcus species NOT DETECTED NOT DETECTED Final   Listeria monocytogenes NOT DETECTED NOT DETECTED Final   Staphylococcus species NOT DETECTED NOT DETECTED Final   Staphylococcus aureus NOT DETECTED NOT DETECTED Final   Streptococcus species NOT DETECTED NOT DETECTED Final   Streptococcus agalactiae NOT DETECTED NOT DETECTED Final   Streptococcus pneumoniae NOT DETECTED NOT DETECTED Final   Streptococcus pyogenes NOT DETECTED NOT DETECTED Final   Acinetobacter baumannii NOT DETECTED NOT DETECTED Final   Enterobacteriaceae species DETECTED (A) NOT DETECTED Final    Comment: Enterobacteriaceae represent a large family of gram-negative bacteria, not a single organism. CRITICAL RESULT CALLED TO, READ BACK BY AND VERIFIED WITH: L. Seay Pharm.D. 16:30 01/02/17 ([email or web address])    Enterobacter cloacae complex NOT DETECTED NOT DETECTED Final   Escherichia coli DETECTED (A) NOT DETECTED Final    Comment: CRITICAL RESULT CALLED TO, READ BACK BY AND VERIFIED WITH: L. Seay Pharm.D. 16:30 01/02/17  ([email or web address])    Klebsiella oxytoca NOT DETECTED NOT DETECTED Final   Klebsiella pneumoniae NOT DETECTED NOT DETECTED Final   Proteus species NOT DETECTED NOT DETECTED Final   Serratia marcescens NOT DETECTED NOT DETECTED Final   Carbapenem resistance NOT DETECTED NOT DETECTED Final   Haemophilus influenzae NOT DETECTED NOT DETECTED Final   Neisseria meningitidis NOT DETECTED NOT DETECTED Final   Pseudomonas aeruginosa NOT DETECTED NOT DETECTED Final   Candida albicans NOT DETECTED NOT DETECTED Final   Candida glabrata NOT DETECTED NOT DETECTED Final   Candida krusei NOT DETECTED NOT DETECTED Final   Candida parapsilosis NOT DETECTED NOT DETECTED Final   Candida tropicalis NOT DETECTED NOT DETECTED Final    Comment: Performed at [facility], [address] [place], [place] [address]  Culture, blood (Routine x 2)     Status: None (Preliminary result)   Collection Time: 01/01/17 10:10 PM  Result Value Ref Range Status   Specimen Description BLOOD RIGHT HAND  Final   Special Requests BOTTLES DRAWN AEROBIC AND ANAEROBIC 6CC EACH  Final   Culture  Setup Time   Final    GRAM NEGATIVE RODS Gram Stain Report Called to,Read Back By and Verified With: FOLEY B. AT 0950 ON [id number] BY THOMPSON S.    Culture PENDING  Incomplete   Report Status PENDING  Incomplete  C difficile quick scan w PCR reflex     Status: None   Collection Time: 01/01/17 11:55 PM  Result Value Ref Range Status   C Diff antigen NEGATIVE NEGATIVE Final   C Diff toxin NEGATIVE NEGATIVE Final   C Diff interpretation No C. difficile detected.  Final      Lab Results  Component Value Date   CALCIUM 7.4 (L) 01/03/2017   CAION 1.14 02/07/2009      Impression: 1)Renal  AKI secondary to ATN vs HUS                AKI sec to Hypovolemia/Hypotension  AKI sec to Sepsis/ACE               AKI on CKD               CKD stage 3 .               CKD since 2016               CKD secondary to Multiple  UTI/ Age  associated decline                Progression of CKD now marked with AKI                    Data in favor of HUS                Confusion + AKI + Low HGb + Low plts                 Data against HUS               Confusion- baseline dementia               Hgb- not that low when checked from earlier baseline and high HGb could be from hemoconcentration.                  Low plts could be explained by sepsis                2)CVS- hemodynamically stable   3)Anemia HGb lower than before   4)Thmbocytopenia   Platelts counts trending lower  5)ID- admitted with gram negative sepsis.  On IV abx Primary MD following  6)Electrolytes Normokalemic NOrmonatremic   7)Acid base Co2 low Admitted with Lactic acidosis      Plan:  Will suggest to check for LDH, Haptoglobin and coombs test to help rule  HUS TTP as has sepsis and HGb is low, Plts are low.  Will suggest to ask for Peripheral blood smear.  Will suggest to ask for FENA.  Agree with IVF.  Agree with holding Ace.  Will suggest to check abdominal xray as abdomen is distended   Will follow bmet       Zein Helbing S 01/03/2017, 9:52 AM

## 2017-01-03 NOTE — Progress Notes (Signed)
PROGRESS NOTE  Jamie Cochran ZOX:[id number] DOB: Feb 13, 1930 DOA: 01/01/2017 PCP: Bennie Pierini, FNP  Brief Narrative: 83 year old woman presented with watery diarrhea, chills, agitation, refusal to eat or drink. PMH recurrent UTIs. Initial evaluation revealed fever, lactic acidosis, acute kidney injury, thrombocytopenia, UTI. Treated and admitted for sepsis secondary to UTI.  Assessment/Plan 1. Sepsis secondary to E coli bacteremia, presuming from UTI with associated fever, lactic acidosis, thrombocytopenia and acute kidney injury on admission.  Continue empiric antibiotics. Follow-up culture data. 2. Gram-negative rod bacteremia. Treatment as above. 3. Acute kidney injury on CKD 3. Slightly worse today. Likely prerenal azotemia from poor oral intake exacerbated by Lasix, lisinopril. Consider ATN from sepsis.  Strict I/O, Renal ultrasound unremarkable, continue IV fluids. Monitor for volume overload. Nephrology consult. 4. Thrombocytopenia secondary to sepsis. Appears to be stabilizing. Hemolytic work up ordered. 5. V/D. Appears resolved. Abdominal exam benign. 6. Dementia , without behavioral disturbance. 7. PMH chronic diastolic congestive heart failure, hypertension, hypothyroidism 8. Diabetes mellitus on insulin, blood sugar stable.    DVT prophylaxis: SCDs Code Status: DNR Family Communication: none Disposition Plan: pending   Erick Blinks, MD  [facility] Direct contact: [phone] --Via amion app OR  --[email or web address]; password [email or web address]  7PM-7AM contact night coverage as above 01/03/2017, 3:42 PM  LOS: 2 days   Consultants:  Nephrology  Procedures:    Antimicrobials:  Ceftriaxone 2/20 >>  Interval history/Subjective: Confused. No new complaints. Denies shortness of breath  Objective: Vitals:   01/02/17 1520 01/02/17 2238 01/03/17 0417 01/03/17 1300  BP: (!) 116/54 (!) 183/72 (!) 153/73 135/68  Pulse: 76 78 74 71  Resp: 18 18 18 18     Temp: 98.2 F (36.8 C) 98 F (36.7 C) 98.6 F (37 C) 98.5 F (36.9 C)  TempSrc: Oral Oral Oral Oral  SpO2: 97% 98% 97% 96%  Weight:   61.8 kg (136 lb 4.8 oz)   Height:        Intake/Output Summary (Last 24 hours) at 01/03/17 1542 Last data filed at 01/03/17 1200  Gross per 24 hour  Intake             1320 ml  Output              405 ml  Net              915 ml     Filed Weights   01/02/17 0048 01/02/17 0335 01/03/17 0417  Weight: 59.5 kg (131 lb 3.2 oz) 60.5 kg (133 lb 4.8 oz) 61.8 kg (136 lb 4.8 oz)    Exam:    Constitutional:   Appears calm and comfortable lying in bed, nontoxic  Cardiovascular regular rate and rhythm. No murmur, rub or gallop. No lower extremity edema.  Respiratory clear to auscultation bilaterally. No wheezes, rales or rhonchi. Normal respiratory effort.  Abdomen soft, nontender, nondistended, positive bowel sounds  Skin appears unremarkable, well perfused. No petechiae or ecchymosis.  Psychiatric grossly normal mood and affect, speech is fluent but not appropriate.  Musculoskeletal moves all extremities well.    Scheduled Meds: . cefTRIAXone (ROCEPHIN)  IV  2 g Intravenous Q24H  . citalopram  20 mg Oral Daily  . insulin aspart  0-15 Units Subcutaneous Q4H  . insulin glargine  10 Units Subcutaneous QHS  . levothyroxine  75 mcg Oral QAC breakfast  . memantine  10 mg Oral BID  . pantoprazole  40 mg Oral Daily   Continuous Infusions:   Principal Problem:   Sepsis secondary to  UTI [facility](HCC) Active Problems:   Hypertension   Hypothyroidism   Diabetes mellitus type 2, controlled (HCC)   Chronic diastolic CHF (congestive heart failure) (HCC)   Dementia   Acute kidney injury (HCC)   Watery diarrhea   Thrombocytopenia (HCC)   Bacteremia   LOS: 2 days

## 2017-01-03 NOTE — Progress Notes (Signed)
Physical Therapy Treatment Patient Details Name: Jamie Cochran MRN: [id number] DOB: 1929-12-29 Today's Date: 01/03/2017    History of Present Illness 83 y.o. female with medical history significant for dementia, chronic diastolic CHF, hypothyroidism, hypertension, insulin-dependent diabetes mellitus, and recurrent UTI who presents to the emergency department with watery diarrhea, chills, food refusal, and agitation. Patient was reportedly in her usual state of health until yesterday evening when she developed watery diarrhea, experiencing roughly 3 episodes. She was also noted to have shaking chills last night before going to bed. She seemed to have improved this morning, but then went on to have numerous episodes of watery diarrhea throughout the day. She also had recurrence in the shaking chills, but had no pain complaints and no vomiting or cough. Patient refused food or drink throughout the day today and began to become agitated with her granddaughter this evening. She was brought in for evaluation of this and has not attempted any interventions at home prior to coming in. Granddaughter reports that the patient has had recurrent UTIs and was treated with antibiotics a couple months ago. They do not know of any history of C. difficile.    PT Comments    Pt received in bed, asleep, but awakens to light touch.  Pt is agreeable to PT tx today, but requests that padding be placed in the chair due to "chapped" bottom.  Pt was able to ambulate 7ft with RW and and Min A.  Pt returned to the chair, and requested some water.  After drinking water pt had an intense coughing spell with face turning red, eyes watering, and audible wheezing.  Lequita Halt, RN notified of this event.  Pt may need SLP consult.  Continue to recommend that she have 24/7 supervision/assistance and HHPT upon d/c.    Follow Up Recommendations  Home health PT;Supervision/Assistance - 24 hour     Equipment Recommendations  None  recommended by PT    Recommendations for Other Services Speech consult     Precautions / Restrictions Precautions Precautions: Fall Restrictions Weight Bearing Restrictions: No    Mobility  Bed Mobility   Bed Mobility: Supine to Sit     Supine to sit: Min assist     General bed mobility comments: bed flat  Transfers   Equipment used: Rolling walker (2 wheeled) Transfers: Sit to/from Stand Sit to Stand: Min guard            Ambulation/Gait Ambulation/Gait assistance: Architect (Feet): 20 Feet Assistive device: Rolling walker (2 wheeled) Gait Pattern/deviations: Step-through pattern;Trunk flexed         Stairs            Wheelchair Mobility    Modified Rankin (Stroke Patients Only)       Balance Overall balance assessment: Needs assistance Sitting-balance support: Bilateral upper extremity supported;Feet supported       Standing balance support: Bilateral upper extremity supported Standing balance-Leahy Scale: Fair                      Cognition Arousal/Alertness: Awake/alert Behavior During Therapy: WFL for tasks assessed/performed Overall Cognitive Status: Difficult to assess                      Exercises      General Comments        Pertinent Vitals/Pain Pain Assessment: No/denies pain    Home Living  Prior Function            PT Goals (current goals can now be found in the care plan section) Acute Rehab PT Goals Patient Stated Goal: None stated PT Goal Formulation: With patient Time For Goal Achievement: 01/09/17 Potential to Achieve Goals: Good Progress towards PT goals: Progressing toward goals    Frequency    Min 3X/week      PT Plan Current plan remains appropriate    Co-evaluation             End of Session Equipment Utilized During Treatment: Gait belt;Oxygen Activity Tolerance: Patient tolerated treatment well Patient left: in  chair;with call bell/phone within reach;with chair alarm set Nurse Communication: Mobility status Lequita Halt(Morgan, RN notified of pt's mobiltiy status and location. ) PT Visit Diagnosis: Muscle weakness (generalized) (M62.81);Other abnormalities of gait and mobility (R26.89)     Time: [id number] PT Time Calculation (min) (ACUTE ONLY): 17 min  Charges:  $Gait Training: 8-22 mins                    G Codes:       Beth Adhvik Canady, PT, DPT X: [phone]

## 2017-01-03 NOTE — Progress Notes (Signed)
CRITICAL VALUE ALERT  Critical value received:  Enteropathogenic E Coli  Date of notification:  01/03/2017  Time of notification:  1748  Critical value read back:Yes.     Nurse who received alert:  M. Dishmon, RN  MD notified (1st page):  Kerry HoughMemon, MD  Time of first page:  1752  MD notified (2nd page):  Time of second page:

## 2017-01-04 LAB — CBC WITH DIFFERENTIAL/PLATELET
Basophils Absolute: 0 10*3/uL (ref 0.0–0.1)
Basophils Relative: 0 %
EOS ABS: 0 10*3/uL (ref 0.0–0.7)
EOS PCT: 1 %
HCT: 26.7 % — ABNORMAL LOW (ref 36.0–46.0)
Hemoglobin: 9 g/dL — ABNORMAL LOW (ref 12.0–15.0)
LYMPHS PCT: 31 %
Lymphs Abs: 1.6 10*3/uL (ref 0.7–4.0)
MCH: 31.4 pg (ref 26.0–34.0)
MCHC: 33.7 g/dL (ref 30.0–36.0)
MCV: 93 fL (ref 78.0–100.0)
MONO ABS: 0.4 10*3/uL (ref 0.1–1.0)
MONOS PCT: 7 %
Neutro Abs: 3.1 10*3/uL (ref 1.7–7.7)
Neutrophils Relative %: 61 %
PLATELETS: 54 10*3/uL — AB (ref 150–400)
RBC: 2.87 MIL/uL — ABNORMAL LOW (ref 3.87–5.11)
RDW: 15.9 % — ABNORMAL HIGH (ref 11.5–15.5)
WBC: 5.1 10*3/uL (ref 4.0–10.5)

## 2017-01-04 LAB — GLUCOSE, CAPILLARY
GLUCOSE-CAPILLARY: 134 mg/dL — AB (ref 65–99)
GLUCOSE-CAPILLARY: 68 mg/dL (ref 65–99)
GLUCOSE-CAPILLARY: 164 mg/dL — AB (ref 65–99)
Glucose-Capillary: 110 mg/dL — ABNORMAL HIGH (ref 65–99)
Glucose-Capillary: 232 mg/dL — ABNORMAL HIGH (ref 65–99)
Glucose-Capillary: 87 mg/dL (ref 65–99)
Glucose-Capillary: 90 mg/dL (ref 65–99)

## 2017-01-04 LAB — CULTURE, BLOOD (ROUTINE X 2)

## 2017-01-04 LAB — UREA NITROGEN, URINE: Urea Nitrogen, Ur: 425 mg/dL

## 2017-01-04 LAB — BASIC METABOLIC PANEL
Anion gap: 6 (ref 5–15)
BUN: 44 mg/dL — AB (ref 6–20)
CO2: 21 mmol/L — ABNORMAL LOW (ref 22–32)
CREATININE: 2.49 mg/dL — AB (ref 0.44–1.00)
Calcium: 7.3 mg/dL — ABNORMAL LOW (ref 8.9–10.3)
Chloride: 116 mmol/L — ABNORMAL HIGH (ref 101–111)
GFR calc Af Amer: 19 mL/min — ABNORMAL LOW (ref >60)
GFR, EST NON AFRICAN AMERICAN: 17 mL/min — AB (ref >60)
GLUCOSE: 109 mg/dL — AB (ref 65–99)
Potassium: 3.6 mmol/L (ref 3.5–5.1)
SODIUM: 143 mmol/L (ref 135–145)

## 2017-01-04 LAB — URINE CULTURE

## 2017-01-04 MED ORDER — GUAIFENESIN ER 600 MG PO TB12
1200.0000 mg | ORAL_TABLET | Freq: Two times a day (BID) | ORAL | Status: DC
  Administered 2017-01-04 – 2017-01-07 (×7): 1200 mg via ORAL
  Filled 2017-01-04 (×7): qty 2

## 2017-01-04 MED ORDER — DEXTROSE 50 % IV SOLN
25.0000 mL | Freq: Once | INTRAVENOUS | Status: AC
  Administered 2017-01-04: 25 mL via INTRAVENOUS

## 2017-01-04 MED ORDER — DEXTROSE 50 % IV SOLN
INTRAVENOUS | Status: AC
  Administered 2017-01-05: 25 mL via INTRAVENOUS
  Filled 2017-01-04: qty 50

## 2017-01-04 MED ORDER — GUAIFENESIN-DM 100-10 MG/5ML PO SYRP: 5.0000 mL | ORAL_SOLUTION | Freq: Four times a day (QID) | ORAL | Status: DC | PRN

## 2017-01-04 MED ORDER — ALBUTEROL SULFATE (2.5 MG/3ML) 0.083% IN NEBU
2.5000 mg | INHALATION_SOLUTION | RESPIRATORY_TRACT | Status: DC | PRN
  Administered 2017-01-04: 2.5 mg via RESPIRATORY_TRACT
  Filled 2017-01-04: qty 3

## 2017-01-04 NOTE — Progress Notes (Signed)
Blood sugar 68. Patient received 25ml of D50. Blood sugar is 134.

## 2017-01-04 NOTE — Progress Notes (Signed)
Jamie Cochran  MRN: [id number]  DOB/AGE: 05-19-1930 83 y.o.  Primary Care Physician:Mary-Margaret Daphine Deutscher, FNP  Admit date: 01/01/2017  Chief Complaint:  Chief Complaint  Patient presents with  . Emesis    S-Pt presented on  01/01/2017 with  Chief Complaint  Patient presents with  . Emesis  .    Pt more alert than yesterday. Pt says " I still have cough"   Meds . cefTRIAXone (ROCEPHIN)  IV  2 g Intravenous Q24H  . citalopram  20 mg Oral Daily  . dextrose      . insulin aspart  0-15 Units Subcutaneous Q4H  . insulin glargine  10 Units Subcutaneous QHS  . ipratropium-albuterol  3 mL Nebulization Q6H  . levothyroxine  75 mcg Oral QAC breakfast  . memantine  10 mg Oral BID  . pantoprazole  40 mg Oral Daily      Physical Exam: Vital signs in last 24 hours: Temp:  [98.5 F (36.9 C)-101.3 F (38.5 C)] 100.1 F (37.8 C) (02/22 0635) Pulse Rate:  [71-88] 87 (02/22 0506) Resp:  [18] 18 (02/22 0506) BP: (135-167)/(68-77) 158/74 (02/22 0506) SpO2:  [90 %-96 %] 94 % (02/22 0728) Weight:  [146 lb 12.8 oz (66.6 kg)] 146 lb 12.8 oz (66.6 kg) (02/22 0506) Weight change: 10 lb 8 oz (4.763 kg) Last BM Date: 01/03/17  Intake/Output from previous day: 02/21 0701 - 02/22 0700 In: 365 [P.O.:240; I.V.:125] Out: 1205 [Urine:1200; Stool:5] No intake/output data recorded.   Physical Exam: General- pt is awake, more alert than yesterday ,following commands. Resp- No acute REsp distress,  Rhonchi+ CVS- S1S2 regular in rate and rhythm GIT- BS+, soft, NT, distended EXT- NO LE Edema, NO Cyanosis    Lab Results: CBC  Recent Labs  01/03/17 0525 01/04/17 0614  WBC 6.4 5.1  HGB 10.0* 9.0*  HCT 32.2* 26.7*  PLT 53* 54*    BMET  Recent Labs  01/03/17 0525 01/04/17 0614  NA 142 143  K 4.5 3.6  CL 113* 116*  CO2 20* 21*  GLUCOSE 121* 109*  BUN 55* 44*  CREATININE 3.44* 2.49*  CALCIUM 7.4* 7.3*    Creat trend 2018  3.0=>3.3=>3.4=>2.49 2017  0.8--1.2 2016    0.7--1.2    Hb trend 10.5=>12.6=>10  Plts trend 149=>77=>53    MICRO Recent Results (from the past 240 hour(s))  Urine culture     Status: Abnormal   Collection Time: 01/01/17  8:58 PM  Result Value Ref Range Status   Specimen Description URINE, CATHETERIZED  Final   Special Requests NONE  Final   Culture >=100,000 COLONIES/mL ESCHERICHIA COLI (A)  Final   Report Status 01/04/2017 FINAL  Final   Organism ID, Bacteria ESCHERICHIA COLI (A)  Final      Susceptibility   Escherichia coli - MIC*    AMPICILLIN <=2 SENSITIVE Sensitive     CEFAZOLIN <=4 SENSITIVE Sensitive     CEFTRIAXONE <=1 SENSITIVE Sensitive     CIPROFLOXACIN >=4 RESISTANT Resistant     GENTAMICIN <=1 SENSITIVE Sensitive     IMIPENEM <=0.25 SENSITIVE Sensitive     NITROFURANTOIN <=16 SENSITIVE Sensitive     TRIMETH/SULFA <=20 SENSITIVE Sensitive     AMPICILLIN/SULBACTAM <=2 SENSITIVE Sensitive     PIP/TAZO <=4 SENSITIVE Sensitive     Extended ESBL NEGATIVE Sensitive     * >=100,000 COLONIES/mL ESCHERICHIA COLI  Culture, blood (Routine x 2)     Status: Abnormal   Collection Time: 01/01/17 10:01 PM  Result Value  Ref Range Status   Specimen Description BLOOD RIGHT ARM  Final   Special Requests BOTTLES DRAWN AEROBIC AND ANAEROBIC [facility]6CC EACH  Final   Culture  Setup Time   Final    GRAM NEGATIVE RODS Gram Stain Report Called to,Read Back By and Verified With: FOLEY B. AT 0950 ON [id number] BY THOMPSON S. Organism ID to follow CRITICAL RESULT CALLED TO, READ BACK BY AND VERIFIED WITH: L. Seay Pharm.D. 16:30 01/02/17 ([email or web address]) Performed at [facility], [address] [place], [place] [address]    Culture ESCHERICHIA COLI (A)  Final   Report Status 01/04/2017 FINAL  Final   Organism ID, Bacteria ESCHERICHIA COLI  Final      Susceptibility   Escherichia coli - MIC*    AMPICILLIN <=2 SENSITIVE Sensitive     CEFAZOLIN <=4 SENSITIVE Sensitive     CEFEPIME <=1 SENSITIVE Sensitive     CEFTAZIDIME <=1  SENSITIVE Sensitive     CEFTRIAXONE <=1 SENSITIVE Sensitive     CIPROFLOXACIN >=4 RESISTANT Resistant     GENTAMICIN <=1 SENSITIVE Sensitive     IMIPENEM <=0.25 SENSITIVE Sensitive     TRIMETH/SULFA <=20 SENSITIVE Sensitive     AMPICILLIN/SULBACTAM <=2 SENSITIVE Sensitive     PIP/TAZO <=4 SENSITIVE Sensitive     Extended ESBL NEGATIVE Sensitive     * ESCHERICHIA COLI  Blood Culture ID Panel (Reflexed)     Status: Abnormal   Collection Time: 01/01/17 10:01 PM  Result Value Ref Range Status   Enterococcus species NOT DETECTED NOT DETECTED Final   Listeria monocytogenes NOT DETECTED NOT DETECTED Final   Staphylococcus species NOT DETECTED NOT DETECTED Final   Staphylococcus aureus NOT DETECTED NOT DETECTED Final   Streptococcus species NOT DETECTED NOT DETECTED Final   Streptococcus agalactiae NOT DETECTED NOT DETECTED Final   Streptococcus pneumoniae NOT DETECTED NOT DETECTED Final   Streptococcus pyogenes NOT DETECTED NOT DETECTED Final   Acinetobacter baumannii NOT DETECTED NOT DETECTED Final   Enterobacteriaceae species DETECTED (A) NOT DETECTED Final    Comment: Enterobacteriaceae represent a large family of gram-negative bacteria, not a single organism. CRITICAL RESULT CALLED TO, READ BACK BY AND VERIFIED WITH: L. Seay Pharm.D. 16:30 01/02/17 (wilsonm)    Enterobacter cloacae complex NOT DETECTED NOT DETECTED Final   Escherichia coli DETECTED (A) NOT DETECTED Final    Comment: CRITICAL RESULT CALLED TO, READ BACK BY AND VERIFIED WITH: L. Seay Pharm.D. 16:30 01/02/17 (wilsonm)    Klebsiella oxytoca NOT DETECTED NOT DETECTED Final   Klebsiella pneumoniae NOT DETECTED NOT DETECTED Final   Proteus species NOT DETECTED NOT DETECTED Final   Serratia marcescens NOT DETECTED NOT DETECTED Final   Carbapenem resistance NOT DETECTED NOT DETECTED Final   Haemophilus influenzae NOT DETECTED NOT DETECTED Final   Neisseria meningitidis NOT DETECTED NOT DETECTED Final   Pseudomonas aeruginosa  NOT DETECTED NOT DETECTED Final   Candida albicans NOT DETECTED NOT DETECTED Final   Candida glabrata NOT DETECTED NOT DETECTED Final   Candida krusei NOT DETECTED NOT DETECTED Final   Candida parapsilosis NOT DETECTED NOT DETECTED Final   Candida tropicalis NOT DETECTED NOT DETECTED Final    Comment: Performed at [facility], [address] [place], [place] [address]  Culture, blood (Routine x 2)     Status: Abnormal   Collection Time: 01/01/17 10:10 PM  Result Value Ref Range Status   Specimen Description BLOOD RIGHT HAND  Final   Special Requests BOTTLES DRAWN AEROBIC AND ANAEROBIC 6CC  EACH  Final   Culture  Setup Time   Final    GRAM NEGATIVE RODS Gram Stain Report Called to,Read Back By and Verified With: FOLEY B. AT 0950 ON [id number] BY THOMPSON S.    Culture (A)  Final    ESCHERICHIA COLI SUSCEPTIBILITIES PERFORMED ON PREVIOUS CULTURE WITHIN THE LAST 5 DAYS. Performed at [facility], [address] [place], [place] [address]    Report Status 01/04/2017 FINAL  Final  Gastrointestinal Panel by PCR , Stool     Status: Abnormal   Collection Time: 01/01/17 11:55 PM  Result Value Ref Range Status   Campylobacter species NOT DETECTED NOT DETECTED Final   Plesimonas shigelloides NOT DETECTED NOT DETECTED Final   Salmonella species NOT DETECTED NOT DETECTED Final   Yersinia enterocolitica NOT DETECTED NOT DETECTED Final   Vibrio species NOT DETECTED NOT DETECTED Final   Vibrio cholerae NOT DETECTED NOT DETECTED Final   Enteroaggregative E coli (EAEC) NOT DETECTED NOT DETECTED Final   Enteropathogenic E coli (EPEC) DETECTED (A) NOT DETECTED Final    Comment: CRITICAL RESULT CALLED TO, READ BACK BY AND VERIFIED WITH: MORGAN DISHMAN AT 1747 ON 01/03/17 BY SNJ    Enterotoxigenic E coli (ETEC) NOT DETECTED NOT DETECTED Final   Shiga like toxin producing E coli (STEC) NOT DETECTED NOT DETECTED Final   Shigella/Enteroinvasive E coli (EIEC) NOT DETECTED NOT DETECTED Final    Cryptosporidium NOT DETECTED NOT DETECTED Final   Cyclospora cayetanensis NOT DETECTED NOT DETECTED Final   Entamoeba histolytica NOT DETECTED NOT DETECTED Final   Giardia lamblia NOT DETECTED NOT DETECTED Final   Adenovirus F40/41 NOT DETECTED NOT DETECTED Final   Astrovirus NOT DETECTED NOT DETECTED Final   Norovirus GI/GII NOT DETECTED NOT DETECTED Final   Rotavirus A NOT DETECTED NOT DETECTED Final   Sapovirus (I, II, IV, and V) NOT DETECTED NOT DETECTED Final  C difficile quick scan w PCR reflex     Status: None   Collection Time: 01/01/17 11:55 PM  Result Value Ref Range Status   C Diff antigen NEGATIVE NEGATIVE Final   C Diff toxin NEGATIVE NEGATIVE Final   C Diff interpretation No C. difficile detected.  Final      Lab Results  Component Value Date   CALCIUM 7.3 (L) 01/04/2017   CAION 1.14 02/07/2009               Impression: 1)Renal  AKI secondary to ATN vs HUS                AKI sec to Hypovolemia/Hypotension               AKI sec to Sepsis/ACE               AKI on CKD               CKD stage 3 .               CKD since 2016               CKD secondary to Multiple  UTI/ Age associated decline                Progression of CKD now marked with AKI                    Data in favor of HUS                Confusion +  AKI + Low HGb + Low plts                LDH high               Coombs negative                            Data against HUS               Confusion- baseline dementia               Hgb- not that low when checked from earlier baseline and high HGb could be from hemoconcentration.               Low plts could be explained by sepsis                Haptoglobin pending                  AKI now better                Creat trending down  2)CVS- hemodynamically stable   3)Anemia HGb lower than before   4)Thmbocytopenia   Platelts counts trending lower  5)ID- admitted with gram negative sepsis.  On IV abx Primary MD  following  6)Electrolytes Normokalemic NOrmonatremic   7)Acid base Co2 better Admitted with Lactic acidosis     Plan:  Will continue current tx      Semaje Kinker S 01/04/2017, 9:31 AM

## 2017-01-04 NOTE — Progress Notes (Signed)
Patient has wet lung sounds. Frequent coughing spells. Order received from Dr. Onalee Huaavid to stop IV fluid.

## 2017-01-04 NOTE — Progress Notes (Signed)
Temperature 100.1

## 2017-01-04 NOTE — Progress Notes (Signed)
PROGRESS NOTE  Jamie Cochran ZOX:[id number] DOB: 02-15-1930 DOA: 01/01/2017 PCP: Bennie Pierini, FNP  Brief Narrative: 83 year old woman presented with watery diarrhea, chills, agitation, refusal to eat or drink. PMH recurrent UTIs. Initial evaluation revealed fever, lactic acidosis, acute kidney injury, thrombocytopenia, UTI. Treated and admitted for sepsis secondary to UTI.  Assessment/Plan 1. Sepsis secondary to E coli bacteremia, presuming from UTI with associated fever, lactic acidosis, thrombocytopenia and acute kidney injury on admission.  Continue empiric antibiotics. Follow-up culture data. Patient had recurrent fever earlier this morning. Suspect this may be related to atelectasis. Started on pulmonary hygiene. He'll follow . 2. Escherichia coli bacteremia. Treatment as above. 3. Acute kidney injury on CKD 3.  Likely prerenal azotemia from poor oral intake exacerbated by Lasix, lisinopril. Consider ATN from sepsis.  Strict I/O, Renal ultrasound unremarkable, continue IV fluids. Monitor for volume overload. Nephrology following. Creatinine is better today. 4. Thrombocytopenia secondary to sepsis. Appears to be stabilizing. Hemolytic work up ordered. 5. V/D. stools positive for enteric pathogenic Escherichia coli. Treat supportively.. 6. Dementia , without behavioral disturbance. 7. PMH chronic diastolic congestive heart failure, hypertension, hypothyroidism 8. Diabetes mellitus on insulin, blood sugar stable.  9. Fever. Patient had low-grade fever this morning. She also developed wheezing and shortness of breath yesterday. Family reports that they often note she has coughing episodes after eating or drinking anything. Will request speech therapy evaluation. Fever may also be related to atelectasis. Continue to monitor.   DVT prophylaxis: SCDs Code Status: DNR Family Communication: Discussed with granddaughter who is her main caretaker. Disposition Plan: Discharge home  with services  Erick Blinks, MD  [facility] Direct contact: [phone] --Via amion app OR  --[email or web address]; password [email or web address]  7PM-7AM contact night coverage as above 01/04/2017, 12:06 PM  LOS: 3 days   Consultants:  Nephrology  Procedures:    Antimicrobials:  Ceftriaxone 2/20 >>  Interval history/Subjective: Had developed some cough and shortness of breath with wheezing overnight. Started on nebulizer treatment with improvements.  Objective: Vitals:   01/04/17 0018 01/04/17 0506 01/04/17 0635 01/04/17 0728  BP:  (!) 158/74    Pulse:  87    Resp:  18    Temp:  (!) 101.3 F (38.5 C) 100.1 F (37.8 C)   TempSrc:  Oral Oral   SpO2: 90% 94%  94%  Weight:  66.6 kg (146 lb 12.8 oz)    Height:        Intake/Output Summary (Last 24 hours) at 01/04/17 1206 Last data filed at 01/04/17 0900  Gross per 24 hour  Intake              365 ml  Output             1202 ml  Net             -837 ml     Filed Weights   01/02/17 0335 01/03/17 0417 01/04/17 0506  Weight: 60.5 kg (133 lb 4.8 oz) 61.8 kg (136 lb 4.8 oz) 66.6 kg (146 lb 12.8 oz)    Exam:    Constitutional:   Appears calm and comfortable lying in bed, nontoxic  Cardiovascular regular rate and rhythm. No murmur, rub or gallop. No lower extremity edema.  Respiratory scattered rhonchi. Normal respiratory effort.  Abdomen soft, nontender, nondistended, positive bowel sounds  Skin appears unremarkable, well perfused. No petechiae or ecchymosis.  Psychiatric grossly normal mood and affect, speech is fluent but not appropriate.  Musculoskeletal moves all extremities well.  Scheduled Meds: . cefTRIAXone (ROCEPHIN)  IV  2 g Intravenous Q24H  . citalopram  20 mg Oral Daily  . dextrose      . guaiFENesin  1,200 mg Oral BID  . insulin aspart  0-15 Units Subcutaneous Q4H  . insulin glargine  10 Units Subcutaneous QHS  . ipratropium-albuterol  3 mL Nebulization Q6H  . levothyroxine  75 mcg Oral QAC  breakfast  . memantine  10 mg Oral BID  . pantoprazole  40 mg Oral Daily   Continuous Infusions:   Principal Problem:   Sepsis secondary to UTI [facility](HCC) Active Problems:   Hypertension   Hypothyroidism   Diabetes mellitus type 2, controlled (HCC)   Chronic diastolic CHF (congestive heart failure) (HCC)   Dementia   Acute kidney injury (HCC)   Watery diarrhea   Thrombocytopenia (HCC)   Bacteremia   LOS: 3 days

## 2017-01-04 NOTE — Evaluation (Signed)
Clinical/Bedside Swallow Evaluation Patient Details  Name: Jamie Cochran MRN: [id number] Date of Birth: December 21, 1929  Today's Date: 01/04/2017 Time: SLP Start Time (ACUTE ONLY): 1430 SLP Stop Time (ACUTE ONLY): 1510 SLP Time Calculation (min) (ACUTE ONLY): 40 min  Past Medical History:  Past Medical History:  Diagnosis Date  . CHF (congestive heart failure) (HCC)   . Dementia   . Diabetes type 2, uncontrolled (HCC)   . Hearing loss    history of right sided cholesteotoma s/p Right tympanoplasty with ossicular reconstruction. (03/2002)  . Hyperlipidemia   . Hypertension   . Hypothyroidism   . Non-occlusive coronary artery disease    40% first diagonal stenosis, 50% obtuse marginal stenosis  . Osteoporosis   . Stroke (HCC) in 05/2012, ?   deep right frontal perventricular deep white matter infarct + subacute left thalamic lacunar infarct  . Urinary incontinence    s/p surgery for ureterocele  . UTI (lower urinary tract infection) 09/2015   granddaughter reports "constant UTIs"   Past Surgical History:  Past Surgical History:  Procedure Laterality Date  . ABDOMINAL HYSTERECTOMY    . BLADDER REPAIR    . CESAREAN SECTION    . EYE SURGERY    . INNER EAR SURGERY    . TONSILLECTOMY AND ADENOIDECTOMY     HPI:  83 y.o. female with medical history significant for dementia, chronic diastolic CHF, hypothyroidism, hypertension, insulin-dependent diabetes mellitus, and recurrent UTI who presents to the emergency department with watery diarrhea, chills, food refusal, and agitation. Patient was reportedly in her usual state of health until yesterday evening when she developed watery diarrhea, experiencing roughly 3 episodes. She was also noted to have shaking chills last night before going to bed. She seemed to have improved this morning, but then went on to have numerous episodes of watery diarrhea throughout the day. She also had recurrence in the shaking chills, but had no pain  complaints and no vomiting or cough. Patient refused food or drink throughout the day today and began to become agitated with her granddaughter this evening. She was brought in for evaluation of this and has not attempted any interventions at home prior to coming in. Granddaughter reports that the patient has had recurrent UTIs and was treated with antibiotics a couple months ago. They do not know of any history of C. difficile. MD ordered BSE due to significant coughing and SOB after drinking liquids yesterday.   Assessment / Plan / Recommendation Clinical Impression  Pt without overt signs or symptoms of aspiration while sitting up in chair, however she is visibly short of breath with audible wheezing. Suspect that Pt may have difficulties with coordination of respiration and swallow which increases her risk for aspiration. Family reports occasional coughing episodes at home (not necessarily with po intake). They report that she is a "mouth breather". Head CT from July 2017 reveals: Moderate inflammatory paranasal sinus disease involving the left maxillary and right frontal sinuses as well as the anterior ethmoidal air cells. Pt also with previous stroke. Pt tells SLP that she has previously broken her nose and has had surgery to sinus and inner ear (unconfirmed as family unsure). Given fever earlier today following witnessed suspected aspiration event with water yesterday, will proceed with objective assessment via MBSS tomorrow about 1PM. Ok to resume diet, recommend D2/chopped with thin liquids and NO straws. Pt will need cues to take small sips and will benefit from feeder assist when pt is alert and upright. PO meds whole in  puree for now. If pt becomes more short of breath during po intake or with obvious coughing, discontinue PO until MBSS.   SLP Visit Diagnosis: Dysphagia, unspecified (R13.10)    Aspiration Risk  Mild aspiration risk;Moderate aspiration risk    Diet Recommendation Dysphagia 2  (Fine chop);Thin liquid   Liquid Administration via: Cup;No straw Medication Administration: Whole meds with puree Supervision: Staff to assist with self feeding;Full supervision/cueing for compensatory strategies Compensations: Slow rate;Small sips/bites Postural Changes: Seated upright at 90 degrees;Remain upright for at least 30 minutes after po intake    Other  Recommendations Oral Care Recommendations: Oral care BID;Staff/trained caregiver to provide oral care Other Recommendations: Clarify dietary restrictions   Follow up Recommendations  (pending )      Frequency and Duration min 2x/week  1 week       Prognosis Prognosis for Safe Diet Advancement: Fair      Swallow Study   General Date of Onset: 01/01/17 HPI: 83 y.o. female with medical history significant for dementia, chronic diastolic CHF, hypothyroidism, hypertension, insulin-dependent diabetes mellitus, and recurrent UTI who presents to the emergency department with watery diarrhea, chills, food refusal, and agitation. Patient was reportedly in her usual state of health until yesterday evening when she developed watery diarrhea, experiencing roughly 3 episodes. She was also noted to have shaking chills last night before going to bed. She seemed to have improved this morning, but then went on to have numerous episodes of watery diarrhea throughout the day. She also had recurrence in the shaking chills, but had no pain complaints and no vomiting or cough. Patient refused food or drink throughout the day today and began to become agitated with her granddaughter this evening. She was brought in for evaluation of this and has not attempted any interventions at home prior to coming in. Granddaughter reports that the patient has had recurrent UTIs and was treated with antibiotics a couple months ago. They do not know of any history of C. difficile. MD ordered BSE due to significant coughing and SOB after drinking liquids  yesterday. Type of Study: Bedside Swallow Evaluation Previous Swallow Assessment: BSE [facility]/06/2015 [facility] Diet Prior to this Study: Thin liquids (clear liquids) Temperature Spikes Noted: Yes Respiratory Status: Nasal cannula History of Recent Intubation: No Behavior/Cognition: Alert;Cooperative [facility](HOH) Oral Cavity Assessment: Within Functional Limits Oral Care Completed by SLP: Recent completion by staff Oral Cavity - Dentition: Edentulous Vision: Functional for self-feeding Self-Feeding Abilities: Able to feed self;Needs set up Patient Positioning: Upright in chair Baseline Vocal Quality: Normal (audible wheezing heard) Volitional Cough: Congested;Strong Volitional Swallow: Able to elicit    Oral/Motor/Sensory Function Overall Oral Motor/Sensory Function: Mild impairment (mild labial weakness) Facial ROM: Within Functional Limits Facial Symmetry: Within Functional Limits Facial Strength: Reduced right;Reduced left Facial Sensation: Within Functional Limits Lingual ROM: Within Functional Limits Lingual Symmetry: Within Functional Limits Lingual Strength: Within Functional Limits Lingual Sensation: Within Functional Limits Mandible: Within Functional Limits   Ice Chips Ice chips: Within functional limits Presentation: Spoon   Thin Liquid Thin Liquid: Within functional limits Presentation: Cup;Straw;Self Fed (hand over hand assist)    Nectar Thick Nectar Thick Liquid: Within functional limits Presentation: Self Fed   Honey Thick Honey Thick Liquid: Not tested   Puree Puree: Within functional limits Presentation: Spoon   Solid   Thank you,  Havery MorosDabney Yuritzi Kamp, CCC-SLP [phone]  Solid: Impaired Presentation: Self Fed Oral Phase Functional Implications: Prolonged oral transit        Senai Kingsley 01/04/2017,5:51 PM

## 2017-01-04 NOTE — Progress Notes (Signed)
Patient's temperature 101.3. Tylenol suppository given. Will re check Temperature in 1 hour.

## 2017-01-04 NOTE — Progress Notes (Signed)
Physical Therapy Treatment Patient Details Name: Jamie Cochran MRN: [id number] DOB: 10-15-1930 Today's Date: 01/04/2017    History of Present Illness 83 y.o. female with medical history significant for dementia, chronic diastolic CHF, hypothyroidism, hypertension, insulin-dependent diabetes mellitus, and recurrent UTI who presents to the emergency department with watery diarrhea, chills, food refusal, and agitation. Patient was reportedly in her usual state of health until yesterday evening when she developed watery diarrhea, experiencing roughly 3 episodes. She was also noted to have shaking chills last night before going to bed. She seemed to have improved this morning, but then went on to have numerous episodes of watery diarrhea throughout the day. She also had recurrence in the shaking chills, but had no pain complaints and no vomiting or cough. Patient refused food or drink throughout the day today and began to become agitated with her granddaughter this evening. She was brought in for evaluation of this and has not attempted any interventions at home prior to coming in. Granddaughter reports that the patient has had recurrent UTIs and was treated with antibiotics a couple months ago. They do not know of any history of C. difficile.    PT Comments    Pt received in bed, granddaughter and family friend present, and pt is agreeable to PT tx.  Pt requires Min A for transfers bed<>BSC, and then Min A for short distance ambulation with HHA.  Granddaughter confirmed that the pt has a RW at home, but refuses to use it.  Pt becomes quite SOB during short distance ambulation, however SpO2 remained 96% on  3L of O2.  Continue to recommend HHPT upon d/c.    Follow Up Recommendations  Home health PT;Supervision/Assistance - 24 hour     Equipment Recommendations  None recommended by PT    Recommendations for Other Services       Precautions / Restrictions Precautions Precautions:  Fall Restrictions Weight Bearing Restrictions: No    Mobility  Bed Mobility Overal bed mobility: Needs Assistance Bed Mobility: Supine to Sit     Supine to sit: Min guard;Supervision        Transfers Overall transfer level: Needs assistance Equipment used: 1 person hand held assist Transfers: Sit to/from Stand Sit to Stand: Min assist Stand pivot transfers: Min assist       General transfer comment: bed <>BSC - pt is incontinent and no more briefs available in the room.  Pt had a large watery dark BM - reported to RN.  Pt requires assistance for hygiene.    Ambulation/Gait Ambulation/Gait assistance: Min assist Ambulation Distance (Feet): 10 Feet Assistive device: 1 person hand held assist Gait Pattern/deviations: Step-through pattern     General Gait Details: Pt was able to ambulate to the sink to wash her hands.  She requires assistance for balance while doing so.     Stairs            Wheelchair Mobility    Modified Rankin (Stroke Patients Only)       Balance Overall balance assessment: Needs assistance Sitting-balance support: Bilateral upper extremity supported;Feet supported Sitting balance-Leahy Scale: Good     Standing balance support: Single extremity supported Standing balance-Leahy Scale: Fair                      Cognition Arousal/Alertness: Awake/alert Behavior During Therapy: WFL for tasks assessed/performed Overall Cognitive Status: Within Functional Limits for tasks assessed  Exercises General Exercises - Upper Extremity Shoulder Flexion: Strengthening;Both;10 reps;Seated General Exercises - Lower Extremity Long Arc Quad: Strengthening;Both;15 reps;Seated Hip Flexion/Marching: Strengthening;15 reps;Both;Seated Other Exercises Other Exercises: B shoulder external rotation with elbows in at sides x 10 reps each.     General Comments        Pertinent Vitals/Pain Pain Assessment: No/denies  pain    Home Living   Living Arrangements: Other relatives (grand daughter, and grand son in law. ) Available Help at Discharge: Family;Available 24 hours/day Type of Home: House   Entrance Stairs-Rails: Right Home Layout: One level Home Equipment: Walker - 2 wheels;Bedside commode;Wheelchair - [employer] officermanual;Shower seat;Cane - single point;Grab bars - tub/shower      Prior Function    Gait / Transfers Assistance Needed: Granddaughter states that she is an independent unlimited household ambulator.   ADL's / Homemaking Assistance Needed: granddaughter was assisting with dressing, and bathing.  Granddaughter states she is incontinent and wears briefs, and pt is unable to determine if brief is wet.       PT Goals (current goals can now be found in the care plan section) Acute Rehab PT Goals Patient Stated Goal: family wants to be able to take the patient home, and care for her at home.  PT Goal Formulation: With family Time For Goal Achievement: 01/09/17 Potential to Achieve Goals: Good Progress towards PT goals: Progressing toward goals    Frequency    Min 3X/week      PT Plan Current plan remains appropriate    Co-evaluation             End of Session Equipment Utilized During Treatment: Gait belt;Oxygen Activity Tolerance: Patient tolerated treatment well Patient left: in chair;with call bell/phone within reach;with chair alarm set;with family/visitor present Nurse Communication: Mobility status PT Visit Diagnosis: Muscle weakness (generalized) (M62.81);Other abnormalities of gait and mobility (R26.89)     Time: 1300-1325 PT Time Calculation (min) (ACUTE ONLY): 25 min  Charges:  $Therapeutic Exercise: 8-22 mins $Therapeutic Activity: 8-22 mins                    G Codes:       Beth Lelah Rennaker, PT, DPT X: [phone]

## 2017-01-05 ENCOUNTER — Inpatient Hospital Stay (HOSPITAL_COMMUNITY): Payer: Medicare Other

## 2017-01-05 DIAGNOSIS — E039 Hypothyroidism, unspecified: Secondary | ICD-10-CM

## 2017-01-05 LAB — BASIC METABOLIC PANEL
Anion gap: 5 (ref 5–15)
BUN: 29 mg/dL — AB (ref 6–20)
CALCIUM: 7.5 mg/dL — AB (ref 8.9–10.3)
CO2: 25 mmol/L (ref 22–32)
CREATININE: 1.84 mg/dL — AB (ref 0.44–1.00)
Chloride: 110 mmol/L (ref 101–111)
GFR calc Af Amer: 28 mL/min — ABNORMAL LOW (ref >60)
GFR, EST NON AFRICAN AMERICAN: 24 mL/min — AB (ref >60)
GLUCOSE: 91 mg/dL (ref 65–99)
POTASSIUM: 3.3 mmol/L — AB (ref 3.5–5.1)
SODIUM: 140 mmol/L (ref 135–145)

## 2017-01-05 LAB — GLUCOSE, CAPILLARY
GLUCOSE-CAPILLARY: 115 mg/dL — AB (ref 65–99)
GLUCOSE-CAPILLARY: 67 mg/dL (ref 65–99)
GLUCOSE-CAPILLARY: 164 mg/dL — AB (ref 65–99)
Glucose-Capillary: 42 mg/dL — CL (ref 65–99)
Glucose-Capillary: 134 mg/dL — ABNORMAL HIGH (ref 65–99)
Glucose-Capillary: 42 mg/dL — CL (ref 65–99)
Glucose-Capillary: 108 mg/dL — ABNORMAL HIGH (ref 65–99)
Glucose-Capillary: 116 mg/dL — ABNORMAL HIGH (ref 65–99)

## 2017-01-05 LAB — CBC
HEMATOCRIT: 26.8 % — AB (ref 36.0–46.0)
Hemoglobin: 8.9 g/dL — ABNORMAL LOW (ref 12.0–15.0)
MCH: 30.7 pg (ref 26.0–34.0)
MCHC: 33.2 g/dL (ref 30.0–36.0)
MCV: 92.4 fL (ref 78.0–100.0)
PLATELETS: 53 10*3/uL — AB (ref 150–400)
RBC: 2.9 MIL/uL — ABNORMAL LOW (ref 3.87–5.11)
RDW: 15.4 % (ref 11.5–15.5)
WBC: 5.4 10*3/uL (ref 4.0–10.5)

## 2017-01-05 MED ORDER — POTASSIUM CHLORIDE IN NACL 40-0.9 MEQ/L-% IV SOLN
INTRAVENOUS | Status: DC
  Administered 2017-01-05 – 2017-01-06 (×2): 100 mL/h via INTRAVENOUS

## 2017-01-05 MED ORDER — ORAL CARE MOUTH RINSE
15.0000 mL | Freq: Two times a day (BID) | OROMUCOSAL | Status: DC
  Administered 2017-01-05 – 2017-01-07 (×3): 15 mL via OROMUCOSAL

## 2017-01-05 MED ORDER — POTASSIUM CHLORIDE 20 MEQ PO PACK
40.0000 meq | PACK | Freq: Once | ORAL | Status: AC
  Administered 2017-01-05: 40 meq via ORAL
  Filled 2017-01-05: qty 2

## 2017-01-05 MED ORDER — SODIUM CHLORIDE 0.9 % IV SOLN: INTRAVENOUS | Status: DC

## 2017-01-05 MED ORDER — DEXTROSE 50 % IV SOLN
INTRAVENOUS | Status: AC
Start: 2017-01-05 — End: 2017-01-05
  Filled 2017-01-05: qty 50

## 2017-01-05 MED ORDER — DEXTROSE 50 % IV SOLN
25.0000 mL | INTRAVENOUS | Status: AC
  Administered 2017-01-05: 25 mL via INTRAVENOUS

## 2017-01-05 NOTE — Progress Notes (Signed)
Jamie Cochran  MRN: [id number]  DOB/AGE: 1930-11-11 83 y.o.  Primary Care Physician:Mary-Margaret Daphine DeutscherMartin, FNP  Admit date: 01/01/2017  Chief Complaint:  Chief Complaint  Patient presents with  . Emesis    S-Pt presented on  01/01/2017 with  Chief Complaint  Patient presents with  . Emesis  .    Pt offers no new complaints.Pt says " today I feel better"   Meds . cefTRIAXone (ROCEPHIN)  IV  2 g Intravenous Q24H  . citalopram  20 mg Oral Daily  . guaiFENesin  1,200 mg Oral BID  . insulin aspart  0-15 Units Subcutaneous Q4H  . ipratropium-albuterol  3 mL Nebulization Q6H  . levothyroxine  75 mcg Oral QAC breakfast  . mouth rinse  15 mL Mouth Rinse BID  . memantine  10 mg Oral BID  . pantoprazole  40 mg Oral Daily      Physical Exam: Vital signs in last 24 hours: Temp:  [98.4 F (36.9 C)-99.2 F (37.3 C)] 98.4 F (36.9 C) (02/23 0604) Pulse Rate:  [81-88] 85 (02/23 0604) Resp:  [18-20] 20 (02/23 0604) BP: (171-188)/(75-78) 186/77 (02/23 0604) SpO2:  [93 %-98 %] 95 % (02/23 0902) Weight:  [141 lb (64 kg)] 141 lb (64 kg) (02/23 0604) Weight change: -5 lb 12.8 oz (-2.631 kg) Last BM Date: 01/04/17  Intake/Output from previous day: 02/22 0701 - 02/23 0700 In: 1005 [P.O.:960; IV Piggyback:45] Out: 575 [Urine:275; Stool:300] No intake/output data recorded.   Physical Exam: General- pt is awake, following commands. Resp- No acute REsp distress,  Minimal Rhonchi+ CVS- S1S2 regular in rate and rhythm GIT- BS+, soft, NT, distended EXT- NO LE Edema, NO Cyanosis    Lab Results: CBC  Recent Labs  01/04/17 0614 01/05/17 0548  WBC 5.1 5.4  HGB 9.0* 8.9*  HCT 26.7* 26.8*  PLT 54* 53*    BMET  Recent Labs  01/04/17 0614 01/05/17 0548  NA 143 140  K 3.6 3.3*  CL 116* 110  CO2 21* 25  GLUCOSE 109* 91  BUN 44* 29*  CREATININE 2.49* 1.84*  CALCIUM 7.3* 7.5*    Creat trend 2018  3.0=>3.3=>3.4=>2.49=>1.84 2017  0.8--1.2 2016   0.7--1.2     Hb trend 10.5=>12.6=>10  Plts trend 149=>77=>53    MICRO Recent Results (from the past 240 hour(s))  Urine culture     Status: Abnormal   Collection Time: 01/01/17  8:58 PM  Result Value Ref Range Status   Specimen Description URINE, CATHETERIZED  Final   Special Requests NONE  Final   Culture >=100,000 COLONIES/mL ESCHERICHIA COLI (A)  Final   Report Status 01/04/2017 FINAL  Final   Organism ID, Bacteria ESCHERICHIA COLI (A)  Final      Susceptibility   Escherichia coli - MIC*    AMPICILLIN <=2 SENSITIVE Sensitive     CEFAZOLIN <=4 SENSITIVE Sensitive     CEFTRIAXONE <=1 SENSITIVE Sensitive     CIPROFLOXACIN >=4 RESISTANT Resistant     GENTAMICIN <=1 SENSITIVE Sensitive     IMIPENEM <=0.25 SENSITIVE Sensitive     NITROFURANTOIN <=16 SENSITIVE Sensitive     TRIMETH/SULFA <=20 SENSITIVE Sensitive     AMPICILLIN/SULBACTAM <=2 SENSITIVE Sensitive     PIP/TAZO <=4 SENSITIVE Sensitive     Extended ESBL NEGATIVE Sensitive     * >=100,000 COLONIES/mL ESCHERICHIA COLI  Culture, blood (Routine x 2)     Status: Abnormal   Collection Time: 01/01/17 10:01 PM  Result Value Ref Range Status  Specimen Description BLOOD RIGHT ARM  Final   Special Requests BOTTLES DRAWN AEROBIC AND ANAEROBIC [facility] EACH  Final   Culture  Setup Time   Final    GRAM NEGATIVE RODS Gram Stain Report Called to,Read Back By and Verified With: FOLEY B. AT 0950 ON [id number] BY THOMPSON S. Organism ID to follow CRITICAL RESULT CALLED TO, READ BACK BY AND VERIFIED WITH: L. Seay Pharm.D. 16:30 01/02/17 ([email or web address]) Performed at [facility], [address], [place], [place] [address]    Culture ESCHERICHIA COLI (A)  Final   Report Status 01/04/2017 FINAL  Final   Organism ID, Bacteria ESCHERICHIA COLI  Final      Susceptibility   Escherichia coli - MIC*    AMPICILLIN <=2 SENSITIVE Sensitive     CEFAZOLIN <=4 SENSITIVE Sensitive     CEFEPIME <=1 SENSITIVE Sensitive     CEFTAZIDIME <=1 SENSITIVE  Sensitive     CEFTRIAXONE <=1 SENSITIVE Sensitive     CIPROFLOXACIN >=4 RESISTANT Resistant     GENTAMICIN <=1 SENSITIVE Sensitive     IMIPENEM <=0.25 SENSITIVE Sensitive     TRIMETH/SULFA <=20 SENSITIVE Sensitive     AMPICILLIN/SULBACTAM <=2 SENSITIVE Sensitive     PIP/TAZO <=4 SENSITIVE Sensitive     Extended ESBL NEGATIVE Sensitive     * ESCHERICHIA COLI  Blood Culture ID Panel (Reflexed)     Status: Abnormal   Collection Time: 01/01/17 10:01 PM  Result Value Ref Range Status   Enterococcus species NOT DETECTED NOT DETECTED Final   Listeria monocytogenes NOT DETECTED NOT DETECTED Final   Staphylococcus species NOT DETECTED NOT DETECTED Final   Staphylococcus aureus NOT DETECTED NOT DETECTED Final   Streptococcus species NOT DETECTED NOT DETECTED Final   Streptococcus agalactiae NOT DETECTED NOT DETECTED Final   Streptococcus pneumoniae NOT DETECTED NOT DETECTED Final   Streptococcus pyogenes NOT DETECTED NOT DETECTED Final   Acinetobacter baumannii NOT DETECTED NOT DETECTED Final   Enterobacteriaceae species DETECTED (A) NOT DETECTED Final    Comment: Enterobacteriaceae represent a large family of gram-negative bacteria, not a single organism. CRITICAL RESULT CALLED TO, READ BACK BY AND VERIFIED WITH: L. Seay Pharm.D. 16:30 01/02/17 (wilsonm)    Enterobacter cloacae complex NOT DETECTED NOT DETECTED Final   Escherichia coli DETECTED (A) NOT DETECTED Final    Comment: CRITICAL RESULT CALLED TO, READ BACK BY AND VERIFIED WITH: L. Seay Pharm.D. 16:30 01/02/17 (wilsonm)    Klebsiella oxytoca NOT DETECTED NOT DETECTED Final   Klebsiella pneumoniae NOT DETECTED NOT DETECTED Final   Proteus species NOT DETECTED NOT DETECTED Final   Serratia marcescens NOT DETECTED NOT DETECTED Final   Carbapenem resistance NOT DETECTED NOT DETECTED Final   Haemophilus influenzae NOT DETECTED NOT DETECTED Final   Neisseria meningitidis NOT DETECTED NOT DETECTED Final   Pseudomonas aeruginosa NOT  DETECTED NOT DETECTED Final   Candida albicans NOT DETECTED NOT DETECTED Final   Candida glabrata NOT DETECTED NOT DETECTED Final   Candida krusei NOT DETECTED NOT DETECTED Final   Candida parapsilosis NOT DETECTED NOT DETECTED Final   Candida tropicalis NOT DETECTED NOT DETECTED Final    Comment: Performed at [facility], [address] [place], [place] [address]  Culture, blood (Routine x 2)     Status: Abnormal   Collection Time: 01/01/17 10:10 PM  Result Value Ref Range Status   Specimen Description BLOOD RIGHT HAND  Final   Special Requests BOTTLES DRAWN AEROBIC AND ANAEROBIC [facility] EACH  Final  Culture  Setup Time   Final    GRAM NEGATIVE RODS Gram Stain Report Called to,Read Back By and Verified With: FOLEY B. AT 0950 ON [id number] BY THOMPSON S.    Culture (A)  Final    ESCHERICHIA COLI SUSCEPTIBILITIES PERFORMED ON PREVIOUS CULTURE WITHIN THE LAST 5 DAYS. Performed at [facility], [address] [place], [place] [address]    Report Status 01/04/2017 FINAL  Final  Gastrointestinal Panel by PCR , Stool     Status: Abnormal   Collection Time: 01/01/17 11:55 PM  Result Value Ref Range Status   Campylobacter species NOT DETECTED NOT DETECTED Final   Plesimonas shigelloides NOT DETECTED NOT DETECTED Final   Salmonella species NOT DETECTED NOT DETECTED Final   Yersinia enterocolitica NOT DETECTED NOT DETECTED Final   Vibrio species NOT DETECTED NOT DETECTED Final   Vibrio cholerae NOT DETECTED NOT DETECTED Final   Enteroaggregative E coli (EAEC) NOT DETECTED NOT DETECTED Final   Enteropathogenic E coli (EPEC) DETECTED (A) NOT DETECTED Final    Comment: CRITICAL RESULT CALLED TO, READ BACK BY AND VERIFIED WITH: MORGAN DISHMAN AT 1747 ON 01/03/17 BY SNJ    Enterotoxigenic E coli (ETEC) NOT DETECTED NOT DETECTED Final   Shiga like toxin producing E coli (STEC) NOT DETECTED NOT DETECTED Final   Shigella/Enteroinvasive E coli (EIEC) NOT DETECTED NOT DETECTED Final    Cryptosporidium NOT DETECTED NOT DETECTED Final   Cyclospora cayetanensis NOT DETECTED NOT DETECTED Final   Entamoeba histolytica NOT DETECTED NOT DETECTED Final   Giardia lamblia NOT DETECTED NOT DETECTED Final   Adenovirus F40/41 NOT DETECTED NOT DETECTED Final   Astrovirus NOT DETECTED NOT DETECTED Final   Norovirus GI/GII NOT DETECTED NOT DETECTED Final   Rotavirus A NOT DETECTED NOT DETECTED Final   Sapovirus (I, II, IV, and V) NOT DETECTED NOT DETECTED Final  C difficile quick scan w PCR reflex     Status: None   Collection Time: 01/01/17 11:55 PM  Result Value Ref Range Status   C Diff antigen NEGATIVE NEGATIVE Final   C Diff toxin NEGATIVE NEGATIVE Final   C Diff interpretation No C. difficile detected.  Final      Lab Results  Component Value Date   CALCIUM 7.5 (L) 01/05/2017   CAION 1.14 02/07/2009               Impression: 1)Renal  AKI secondary to ATN vs HUS                AKI sec to Hypovolemia/Hypotension               AKI sec to Sepsis/ACE               AKI on CKD               CKD stage 3 .               CKD since 2016               CKD secondary to Multiple  UTI/ Age associated decline                Progression of CKD now marked with AKI                    AKI now better                Creat trending down  2)CVS- hemodynamically stable  3)Anemia HGb lower than before   4)Thmbocytopenia   Platelts counts trending lower  5)ID- admitted with gram negative sepsis.  On IV abx Primary MD following  6)Electrolytes  Hypokalemic   Will replte  NOrmonatremic   7)Acid base Co2 better Admitted with Lactic acidosis     Plan:  Will replete kcl Will follow bmet     Liora Myles S 01/05/2017, 9:03 AM

## 2017-01-05 NOTE — Progress Notes (Signed)
PROGRESS NOTE  Jamie Cochran:[id number] DOB: August 11, 1930 DOA: 01/01/2017 PCP: Bennie Pierini, FNP  Brief Narrative: 83 year old woman presented with watery diarrhea, chills, agitation, refusal to eat or drink. PMH recurrent UTIs. Initial evaluation revealed fever, lactic acidosis, acute kidney injury, thrombocytopenia, UTI. Treated and admitted for sepsis secondary to UTI.  Assessment/Plan 1. Sepsis secondary to E coli bacteremia, presuming from UTI with associated fever, lactic acidosis, thrombocytopenia and acute kidney injury on admission.  Continue empiric antibiotics. Follow-up culture data.  2. Escherichia coli bacteremia. Treatment as above. 3. Acute kidney injury on CKD 3.  Likely prerenal azotemia from poor oral intake exacerbated by Lasix, lisinopril. Consider ATN from sepsis.  Strict I/O, Renal ultrasound unremarkable, continue IV fluids. Monitor for volume overload. Nephrology following. Creatinine continues to improve. 4. Thrombocytopenia secondary to sepsis. Appears to be stabilizing. Hemolytic work up unremarkable. Haptoglobin in process. 5. V/D. stools positive for enteric pathogenic Escherichia coli. Treat supportively. 6. Dementia , without behavioral disturbance. 7. PMH chronic diastolic congestive heart failure, hypertension, hypothyroidism 8. Diabetes mellitus on insulin, blood sugar stable.  9. Fever. Patient had low-grade fever this morning. She also developed wheezing and shortness of breath yesterday. Family reports that they often note she has coughing episodes after eating or drinking anything. Will request speech therapy evaluation. Fever may also be related to atelectasis. No further fevers. Continue to monitor.   DVT prophylaxis: SCDs Code Status: DNR Family Communication: Discussed with granddaughter who is her main caretaker. Disposition Plan: Discharge home with services  Erick Blinks, MD  [facility] Direct contact:  [phone] --Via amion app OR  --[email or web address]; password [email or web address]  7PM-7AM contact night coverage as above 01/05/2017, 5:42 PM  LOS: 4 days   Consultants:  Nephrology  Procedures:    Antimicrobials:  Ceftriaxone 2/20 >>  Interval history/Subjective: No new complaints. Ambulated earlier today  Objective: Vitals:   01/05/17 0604 01/05/17 0902 01/05/17 1441 01/05/17 1627  BP: (!) 186/77  (!) 174/74   Pulse: 85  84   Resp: 20  18   Temp: 98.4 F (36.9 C)  98.6 F (37 C)   TempSrc: Oral  Oral   SpO2: 98% 95% 96% 95%  Weight: 64 kg (141 lb)     Height:        Intake/Output Summary (Last 24 hours) at 01/05/17 1742 Last data filed at 01/05/17 1300  Gross per 24 hour  Intake              885 ml  Output                0 ml  Net              885 ml     Filed Weights   01/03/17 0417 01/04/17 0506 01/05/17 0604  Weight: 61.8 kg (136 lb 4.8 oz) 66.6 kg (146 lb 12.8 oz) 64 kg (141 lb)    Exam:    Constitutional:   Appears calm and comfortable lying in bed, nontoxic  Cardiovascular regular rate and rhythm. No murmur, rub or gallop. No lower extremity edema.  Respiratory scattered rhonchi. Normal respiratory effort.  Abdomen soft, nontender, nondistended, positive bowel sounds  Skin appears unremarkable, well perfused. No petechiae or ecchymosis.  Psychiatric grossly normal mood and affect, speech is fluent but not appropriate.  Musculoskeletal moves all extremities well.    Scheduled Meds: . cefTRIAXone (ROCEPHIN)  IV  2 g Intravenous Q24H  . citalopram  20 mg Oral Daily  . guaiFENesin  1,200  mg Oral BID  . insulin aspart  0-15 Units Subcutaneous Q4H  . ipratropium-albuterol  3 mL Nebulization Q6H  . levothyroxine  75 mcg Oral QAC breakfast  . mouth rinse  15 mL Mouth Rinse BID  . memantine  10 mg Oral BID  . pantoprazole  40 mg Oral Daily  . potassium chloride  40 mEq Oral Once   Continuous Infusions:   Principal Problem:   Sepsis secondary to UTI  [facility](HCC) Active Problems:   Hypertension   Hypothyroidism   Diabetes mellitus type 2, controlled (HCC)   Chronic diastolic CHF (congestive heart failure) (HCC)   Dementia   Acute kidney injury (HCC)   Watery diarrhea   Thrombocytopenia (HCC)   Bacteremia   LOS: 4 days

## 2017-01-05 NOTE — Progress Notes (Signed)
Objective Swallowing Evaluation: Type of Study: MBS-Modified Barium Swallow Study  Patient Details  Name: Jamie Cochran MRN: [id number] Date of Birth: [birth year]  Today's Date: 01/05/2017 Time: SLP Start Time (ACUTE ONLY): 1305-SLP Stop Time (ACUTE ONLY): 1330 SLP Time Calculation (min) (ACUTE ONLY): 25 min  Past Medical History:  Past Medical History:  Diagnosis Date  . CHF (congestive heart failure) (HCC)   . Dementia   . Diabetes type 2, uncontrolled (HCC)   . Hearing loss    history of right sided cholesteotoma s/p Right tympanoplasty with ossicular reconstruction. (03/2002)  . Hyperlipidemia   . Hypertension   . Hypothyroidism   . Non-occlusive coronary artery disease    40% first diagonal stenosis, 50% obtuse marginal stenosis  . Osteoporosis   . Stroke (HCC) in 05/2012, ?   deep right frontal perventricular deep white matter infarct + subacute left thalamic lacunar infarct  . Urinary incontinence    s/p surgery for ureterocele  . UTI (lower urinary tract infection) 09/2015   granddaughter reports "constant UTIs"   Past Surgical History:  Past Surgical History:  Procedure Laterality Date  . ABDOMINAL HYSTERECTOMY    . BLADDER REPAIR    . CESAREAN SECTION    . EYE SURGERY    . INNER EAR SURGERY    . TONSILLECTOMY AND ADENOIDECTOMY     HPI: 83 y.o. female with medical history significant for dementia, chronic diastolic CHF, hypothyroidism, hypertension, insulin-dependent diabetes mellitus, and recurrent UTI who presents to the emergency department with watery diarrhea, chills, food refusal, and agitation. Patient was reportedly in her usual state of health until yesterday evening when she developed watery diarrhea, experiencing roughly 3 episodes. She was also noted to have shaking chills last night before going to bed. She seemed to have improved this morning, but then went on to have numerous episodes of watery diarrhea throughout the day. She also had recurrence  in the shaking chills, but had no pain complaints and no vomiting or cough. Patient refused food or drink throughout the day today and began to become agitated with her granddaughter this evening. She was brought in for evaluation of this and has not attempted any interventions at home prior to coming in. Granddaughter reports that the patient has had recurrent UTIs and was treated with antibiotics a couple months ago. They do not know of any history of C. difficile. MD ordered BSE due to significant coughing and SOB after drinking liquids yesterday.  Subjective: Family reports that pt is not typicall short of breath   Assessment / Plan / Recommendation  CHL IP CLINICAL IMPRESSIONS 01/05/2017  Clinical Impression Pt currently with a mild oropharyngeal dysphagia characterized by impaired mastication, premature spill of puree and solid material, and inconsistent delay in swallow initiation. On one occasion, puree bolus sat in pharynx for ~10 seconds before swallow was initiated; suspect pt was distracted and then had inattention to bolus. Despite this, no penetration or aspiration was observed. Also worth noting is that pt cleared throat numerous times throughout study (and prior to PO intake) that did NOT indicate penetration or aspiration. Given current respiratory status and impaired mastication, recommend dysphagia 3 diet, thin liquids, meds whole in puree. During meals, ensure pt seated upright and minimize distractions in the room, provide supervision to ensure pt is attending to meal. Will f/u x1 to ensure diet tolerance. May be safe for pt to advance to regular consistency as respiratory status improves.  SLP Visit Diagnosis Dysphagia, oropharyngeal phase (R13.12)  Attention  and concentration deficit following --  Frontal lobe and executive function deficit following --  Impact on safety and function Mild aspiration risk      CHL IP TREATMENT RECOMMENDATION 01/05/2017  Treatment Recommendations  Therapy as outlined in treatment plan below     Prognosis 01/05/2017  Prognosis for Safe Diet Advancement Fair  Barriers to Reach Goals --  Barriers/Prognosis Comment --    CHL IP DIET RECOMMENDATION 01/05/2017  SLP Diet Recommendations Dysphagia 3 (Mech soft) solids;Thin liquid  Liquid Administration via Cup;Straw  Medication Administration Whole meds with puree  Compensations Minimize environmental distractions;Slow rate  Postural Changes Seated upright at 90 degrees      CHL IP OTHER RECOMMENDATIONS 01/05/2017  Recommended Consults --  Oral Care Recommendations Oral care BID  Other Recommendations Clarify dietary restrictions      CHL IP FOLLOW UP RECOMMENDATIONS 01/05/2017  Follow up Recommendations None      CHL IP FREQUENCY AND DURATION 01/05/2017  Speech Therapy Frequency (ACUTE ONLY) min 1 x/week  Treatment Duration 1 week           CHL IP ORAL PHASE 01/05/2017  Oral Phase Impaired  Oral - Pudding Teaspoon --  Oral - Pudding Cup --  Oral - Honey Teaspoon --  Oral - Honey Cup --  Oral - Nectar Teaspoon --  Oral - Nectar Cup --  Oral - Nectar Straw --  Oral - Thin Teaspoon --  Oral - Thin Cup WFL  Oral - Thin Straw WFL  Oral - Puree Premature spillage  Oral - Mech Soft --  Oral - Regular Premature spillage;Impaired mastication  Oral - Multi-Consistency --  Oral - Pill --  Oral Phase - Comment --    CHL IP PHARYNGEAL PHASE 01/05/2017  Pharyngeal Phase Impaired  Pharyngeal- Pudding Teaspoon --  Pharyngeal --  Pharyngeal- Pudding Cup --  Pharyngeal --  Pharyngeal- Honey Teaspoon --  Pharyngeal --  Pharyngeal- Honey Cup --  Pharyngeal --  Pharyngeal- Nectar Teaspoon --  Pharyngeal --  Pharyngeal- Nectar Cup --  Pharyngeal --  Pharyngeal- Nectar Straw --  Pharyngeal --  Pharyngeal- Thin Teaspoon --  Pharyngeal --  Pharyngeal- Thin Cup Delayed swallow initiation-vallecula  Pharyngeal --  Pharyngeal- Thin Straw Delayed swallow initiation-vallecula   Pharyngeal --  Pharyngeal- Puree Delayed swallow initiation-vallecula  Pharyngeal --  Pharyngeal- Mechanical Soft --  Pharyngeal --  Pharyngeal- Regular Delayed swallow initiation-vallecula  Pharyngeal --  Pharyngeal- Multi-consistency --  Pharyngeal --  Pharyngeal- Pill Delayed swallow initiation-vallecula  Pharyngeal --  Pharyngeal Comment --     CHL IP CERVICAL ESOPHAGEAL PHASE 01/05/2017  Cervical Esophageal Phase WFL  Pudding Teaspoon --  Pudding Cup --  Honey Teaspoon --  Honey Cup --  Nectar Teaspoon --  Nectar Cup --  Nectar Straw --  Thin Teaspoon --  Thin Cup --  Thin Straw --  Puree --  Mechanical Soft --  Regular --  Multi-consistency --  Pill --  Cervical Esophageal Comment --    No flowsheet data found.  Metro Kung, MA, CCC-SLP 01/05/2017, 1:52 PM

## 2017-01-05 NOTE — Progress Notes (Signed)
Pt O2 saturations stayed in the 94-95% range on room air while at rest. O2 still set-up at bedside. Will continue to monitor.

## 2017-01-05 NOTE — Progress Notes (Signed)
Pharmacy Antibiotic Note  Jamie Cochran is a 83 y.o. female admitted on 01/01/2017 with UTI/ bacteremia.  Pharmacy has been consulted for rocephin dosing.  Plan: Ceftriaxone 2gm IV q24h F/U cxs and clinical progress  Height: 5' (152.4 cm) Weight: 141 lb (64 kg) IBW/kg (Calculated) : 45.5  Temp (24hrs), Avg:98.8 F (37.1 C), Min:98.4 F (36.9 C), Max:99.2 F (37.3 C)   Recent Labs Lab 01/01/17 2150 01/01/17 2342 01/02/17 0211 01/02/17 0547 01/03/17 0525 01/04/17 0614 01/05/17 0548  WBC  --   --  4.8 5.1 6.4 5.1 5.4  CREATININE  --   --  2.97* 3.31* 3.44* 2.49* 1.84*  LATICACIDVEN 3.67* 2.68* 2.5* 2.2*  --   --   --     Estimated Creatinine Clearance: 19 mL/min (by C-G formula based on SCr of 1.84 mg/dL (H)).    Allergies  Allergen Reactions  . Sulfa Antibiotics Rash   Antimicrobials this admission: Ceftriaxone 2/20>>   Recent Results (from the past 240 hour(s))  Urine culture     Status: Abnormal   Collection Time: 01/01/17  8:58 PM  Result Value Ref Range Status   Specimen Description URINE, CATHETERIZED  Final   Special Requests NONE  Final   Culture >=100,000 COLONIES/mL ESCHERICHIA COLI (A)  Final   Report Status 01/04/2017 FINAL  Final   Organism ID, Bacteria ESCHERICHIA COLI (A)  Final      Susceptibility   Escherichia coli - MIC*    AMPICILLIN <=2 SENSITIVE Sensitive     CEFAZOLIN <=4 SENSITIVE Sensitive     CEFTRIAXONE <=1 SENSITIVE Sensitive     CIPROFLOXACIN >=4 RESISTANT Resistant     GENTAMICIN <=1 SENSITIVE Sensitive     IMIPENEM <=0.25 SENSITIVE Sensitive     NITROFURANTOIN <=16 SENSITIVE Sensitive     TRIMETH/SULFA <=20 SENSITIVE Sensitive     AMPICILLIN/SULBACTAM <=2 SENSITIVE Sensitive     PIP/TAZO <=4 SENSITIVE Sensitive     Extended ESBL NEGATIVE Sensitive     * >=100,000 COLONIES/mL ESCHERICHIA COLI  Culture, blood (Routine x 2)     Status: Abnormal   Collection Time: 01/01/17 10:01 PM  Result Value Ref Range Status   Specimen  Description BLOOD RIGHT ARM  Final   Special Requests BOTTLES DRAWN AEROBIC AND ANAEROBIC [facility] EACH  Final   Culture  Setup Time   Final    GRAM NEGATIVE RODS Gram Stain Report Called to,Read Back By and Verified With: FOLEY B. AT 0950 ON [id number] BY THOMPSON S. Organism ID to follow CRITICAL RESULT CALLED TO, READ BACK BY AND VERIFIED WITH: L. Seay Pharm.D. 16:30 01/02/17 ([email or web address]) Performed at [facility], [address] [place], [place] [address]    Culture ESCHERICHIA COLI (A)  Final   Report Status 01/04/2017 FINAL  Final   Organism ID, Bacteria ESCHERICHIA COLI  Final      Susceptibility   Escherichia coli - MIC*    AMPICILLIN <=2 SENSITIVE Sensitive     CEFAZOLIN <=4 SENSITIVE Sensitive     CEFEPIME <=1 SENSITIVE Sensitive     CEFTAZIDIME <=1 SENSITIVE Sensitive     CEFTRIAXONE <=1 SENSITIVE Sensitive     CIPROFLOXACIN >=4 RESISTANT Resistant     GENTAMICIN <=1 SENSITIVE Sensitive     IMIPENEM <=0.25 SENSITIVE Sensitive     TRIMETH/SULFA <=20 SENSITIVE Sensitive     AMPICILLIN/SULBACTAM <=2 SENSITIVE Sensitive     PIP/TAZO <=4 SENSITIVE Sensitive     Extended ESBL NEGATIVE Sensitive     * ESCHERICHIA  COLI  Blood Culture ID Panel (Reflexed)     Status: Abnormal   Collection Time: 01/01/17 10:01 PM  Result Value Ref Range Status   Enterococcus species NOT DETECTED NOT DETECTED Final   Listeria monocytogenes NOT DETECTED NOT DETECTED Final   Staphylococcus species NOT DETECTED NOT DETECTED Final   Staphylococcus aureus NOT DETECTED NOT DETECTED Final   Streptococcus species NOT DETECTED NOT DETECTED Final   Streptococcus agalactiae NOT DETECTED NOT DETECTED Final   Streptococcus pneumoniae NOT DETECTED NOT DETECTED Final   Streptococcus pyogenes NOT DETECTED NOT DETECTED Final   Acinetobacter baumannii NOT DETECTED NOT DETECTED Final   Enterobacteriaceae species DETECTED (A) NOT DETECTED Final    Comment: Enterobacteriaceae represent a large family of gram-negative  bacteria, not a single organism. CRITICAL RESULT CALLED TO, READ BACK BY AND VERIFIED WITH: L. Seay Pharm.D. 16:30 01/02/17 ([email or web address])    Enterobacter cloacae complex NOT DETECTED NOT DETECTED Final   Escherichia coli DETECTED (A) NOT DETECTED Final    Comment: CRITICAL RESULT CALLED TO, READ BACK BY AND VERIFIED WITH: L. Seay Pharm.D. 16:30 01/02/17 ([place])    Klebsiella oxytoca NOT DETECTED NOT DETECTED Final   Klebsiella pneumoniae NOT DETECTED NOT DETECTED Final   Proteus species NOT DETECTED NOT DETECTED Final   Serratia marcescens NOT DETECTED NOT DETECTED Final   Carbapenem resistance NOT DETECTED NOT DETECTED Final   Haemophilus influenzae NOT DETECTED NOT DETECTED Final   Neisseria meningitidis NOT DETECTED NOT DETECTED Final   Pseudomonas aeruginosa NOT DETECTED NOT DETECTED Final   Candida albicans NOT DETECTED NOT DETECTED Final   Candida glabrata NOT DETECTED NOT DETECTED Final   Candida krusei NOT DETECTED NOT DETECTED Final   Candida parapsilosis NOT DETECTED NOT DETECTED Final   Candida tropicalis NOT DETECTED NOT DETECTED Final    Comment: Performed at [facility], [address], [place], [place] [address]  Culture, blood (Routine x 2)     Status: Abnormal   Collection Time: 01/01/17 10:10 PM  Result Value Ref Range Status   Specimen Description BLOOD RIGHT HAND  Final   Special Requests BOTTLES DRAWN AEROBIC AND ANAEROBIC [facility] EACH  Final   Culture  Setup Time   Final    GRAM NEGATIVE RODS Gram Stain Report Called to,Read Back By and Verified With: FOLEY B. AT 0950 ON [id number] BY THOMPSON S.    Culture (A)  Final    ESCHERICHIA COLI SUSCEPTIBILITIES PERFORMED ON PREVIOUS CULTURE WITHIN THE LAST 5 DAYS. Performed at [facility], [address] [place], [place] [address]    Report Status 01/04/2017 FINAL  Final  Gastrointestinal Panel by PCR , Stool     Status: Abnormal   Collection Time: 01/01/17 11:55 PM  Result Value Ref Range Status    Campylobacter species NOT DETECTED NOT DETECTED Final   Plesimonas shigelloides NOT DETECTED NOT DETECTED Final   Salmonella species NOT DETECTED NOT DETECTED Final   Yersinia enterocolitica NOT DETECTED NOT DETECTED Final   Vibrio species NOT DETECTED NOT DETECTED Final   Vibrio cholerae NOT DETECTED NOT DETECTED Final   Enteroaggregative E coli (EAEC) NOT DETECTED NOT DETECTED Final   Enteropathogenic E coli (EPEC) DETECTED (A) NOT DETECTED Final    Comment: CRITICAL RESULT CALLED TO, READ BACK BY AND VERIFIED WITH: MORGAN DISHMAN AT 1747 ON 01/03/17 BY SNJ    Enterotoxigenic E coli (ETEC) NOT DETECTED NOT DETECTED Final   Shiga like toxin producing E coli (STEC) NOT DETECTED NOT DETECTED Final  Shigella/Enteroinvasive E coli (EIEC) NOT DETECTED NOT DETECTED Final   Cryptosporidium NOT DETECTED NOT DETECTED Final   Cyclospora cayetanensis NOT DETECTED NOT DETECTED Final   Entamoeba histolytica NOT DETECTED NOT DETECTED Final   Giardia lamblia NOT DETECTED NOT DETECTED Final   Adenovirus F40/41 NOT DETECTED NOT DETECTED Final   Astrovirus NOT DETECTED NOT DETECTED Final   Norovirus GI/GII NOT DETECTED NOT DETECTED Final   Rotavirus A NOT DETECTED NOT DETECTED Final   Sapovirus (I, II, IV, and V) NOT DETECTED NOT DETECTED Final  C difficile quick scan w PCR reflex     Status: None   Collection Time: 01/01/17 11:55 PM  Result Value Ref Range Status   C Diff antigen NEGATIVE NEGATIVE Final   C Diff toxin NEGATIVE NEGATIVE Final   C Diff interpretation No C. difficile detected.  Final   Thank you for allowing pharmacy to be a part of this patient's care.  Valrie Hart, PharmD Clinical Pharmacist Pager:  [phone] 01/05/2017   01/05/2017 10:40 AM

## 2017-01-05 NOTE — Progress Notes (Signed)
Hypoglycemic Event  CBG: 42  Treatment: 15 GM carbohydrate snack  Symptoms: None  Follow-up CBG: Time:0151 CBG Result:42   Possible Reasons for Event: Inadequate meal intake  Comments/MD notified: D50% 25ml given. Repeat BG 134. Dr. Onalee Hua notified. Orders to hold Lantus at [facility], and recheck BG in 2 hours.    Marigene Ehlers

## 2017-01-05 NOTE — Progress Notes (Signed)
Currently have pt off of O2 per Dr. Request Pulse Ox on pt. Will continue to monitor and check sat's in approx. 15 mins.

## 2017-01-05 NOTE — Progress Notes (Signed)
Pt ambulated approx. 100 feet in the hallway. Pt tolerated very well and had been sitting up in the chair most of the day. Currently in bed, will continue to monitor.

## 2017-01-05 NOTE — Care Management Note (Signed)
Case Management Note  Patient Details  Name: Jamie Cochran MRN: [id number] Date of Birth: 1930-01-18 Expected Discharge Date:       01/06/2017           Expected Discharge Plan:  Home w Home Health Services  In-House Referral:    N/A  Discharge planning Services  CM Consult  Post Acute Care Choice:  Home Health Choice offered to:   (granddaughter)  HH Arranged:    RN/PT HH Agency:    [facility]  Status of Service:  Completed, signed off  If discussed at Long Length of Stay Meetings, dates discussed:    Additional Comments: DC plan discussed with pt's granddughter over phone. Plan continues to be to DC [facility]. Pt already has RW. Family aware HH has 48 hrs to make first visit. Anticipate DC home over weekend. RN will notify [facility] of pt's DC date.   Jamie Cochran, Jamie Fendley Demske, RN 01/05/2017, 11:54 AM

## 2017-01-06 DIAGNOSIS — T17908A Unspecified foreign body in respiratory tract, part unspecified causing other injury, initial encounter: Secondary | ICD-10-CM | POA: Diagnosis not present

## 2017-01-06 LAB — BASIC METABOLIC PANEL
ANION GAP: 5 (ref 5–15)
BUN: 19 mg/dL (ref 6–20)
CALCIUM: 7.5 mg/dL — AB (ref 8.9–10.3)
CO2: 21 mmol/L — AB (ref 22–32)
CREATININE: 1.3 mg/dL — AB (ref 0.44–1.00)
Chloride: 113 mmol/L — ABNORMAL HIGH (ref 101–111)
GFR calc Af Amer: 42 mL/min — ABNORMAL LOW (ref >60)
GFR, EST NON AFRICAN AMERICAN: 37 mL/min — AB (ref >60)
Glucose, Bld: 134 mg/dL — ABNORMAL HIGH (ref 65–99)
Potassium: 4.4 mmol/L (ref 3.5–5.1)
SODIUM: 139 mmol/L (ref 135–145)

## 2017-01-06 LAB — GLUCOSE, CAPILLARY
GLUCOSE-CAPILLARY: 107 mg/dL — AB (ref 65–99)
GLUCOSE-CAPILLARY: 116 mg/dL — AB (ref 65–99)
GLUCOSE-CAPILLARY: 137 mg/dL — AB (ref 65–99)
GLUCOSE-CAPILLARY: 139 mg/dL — AB (ref 65–99)
Glucose-Capillary: 94 mg/dL (ref 65–99)
Glucose-Capillary: 130 mg/dL — ABNORMAL HIGH (ref 65–99)

## 2017-01-06 MED ORDER — AMOXICILLIN-POT CLAVULANATE 875-125 MG PO TABS
1.0000 | ORAL_TABLET | Freq: Two times a day (BID) | ORAL | Status: DC
  Administered 2017-01-06 – 2017-01-07 (×3): 1 via ORAL
  Filled 2017-01-06 (×3): qty 1

## 2017-01-06 MED ORDER — ALBUTEROL SULFATE (2.5 MG/3ML) 0.083% IN NEBU: 2.5000 mg | INHALATION_SOLUTION | RESPIRATORY_TRACT | 12 refills | Status: DC | PRN

## 2017-01-06 MED ORDER — CARVEDILOL 12.5 MG PO TABS
25.0000 mg | ORAL_TABLET | Freq: Two times a day (BID) | ORAL | Status: DC
  Administered 2017-01-06 – 2017-01-07 (×3): 25 mg via ORAL
  Filled 2017-01-06: qty 8
  Filled 2017-01-06 (×2): qty 2

## 2017-01-06 MED ORDER — AMLODIPINE BESYLATE 5 MG PO TABS
5.0000 mg | ORAL_TABLET | Freq: Every day | ORAL | Status: DC
  Administered 2017-01-06 – 2017-01-07 (×2): 5 mg via ORAL
  Filled 2017-01-06 (×2): qty 1

## 2017-01-06 MED ORDER — INSULIN NPH (HUMAN) (ISOPHANE) 100 UNIT/ML ~~LOC~~ SUSP: 10.0000 [IU] | Freq: Every day | SUBCUTANEOUS | 2 refills | Status: DC

## 2017-01-06 MED ORDER — AMOXICILLIN-POT CLAVULANATE 875-125 MG PO TABS: 1.0000 | ORAL_TABLET | Freq: Two times a day (BID) | ORAL | 0 refills | Status: DC

## 2017-01-06 MED ORDER — FUROSEMIDE 20 MG PO TABS: 40.0000 mg | ORAL_TABLET | Freq: Every day | ORAL | 5 refills | Status: DC | PRN

## 2017-01-06 MED ORDER — GUAIFENESIN ER 600 MG PO TB12: 600.0000 mg | ORAL_TABLET | Freq: Two times a day (BID) | ORAL | 0 refills | Status: DC

## 2017-01-06 MED ORDER — LOPERAMIDE HCL 2 MG PO TABS: 2.0000 mg | ORAL_TABLET | Freq: Four times a day (QID) | ORAL | 0 refills | Status: DC | PRN

## 2017-01-06 NOTE — Progress Notes (Signed)
Jamie Cochran  MRN: [id number]  DOB/AGE: 1930-06-25 83 y.o.  Primary Care Physician:Mary-Margaret Daphine Deutscher, FNP  Admit date: 01/01/2017  Chief Complaint:  Chief Complaint  Patient presents with  . Emesis    S-Pt presented on  01/01/2017 with  Chief Complaint  Patient presents with  . Emesis  .    Pt offers no new complaints.   Meds . amLODipine  5 mg Oral Daily  . amoxicillin-clavulanate  1 tablet Oral Q12H  . carvedilol  25 mg Oral BID WC  . citalopram  20 mg Oral Daily  . guaiFENesin  1,200 mg Oral BID  . insulin aspart  0-15 Units Subcutaneous Q4H  . ipratropium-albuterol  3 mL Nebulization Q6H  . levothyroxine  75 mcg Oral QAC breakfast  . mouth rinse  15 mL Mouth Rinse BID  . memantine  10 mg Oral BID  . pantoprazole  40 mg Oral Daily      Physical Exam: Vital signs in last 24 hours: Temp:  [98.5 F (36.9 C)-99.9 F (37.7 C)] 99.9 F (37.7 C) (02/24 1400) Pulse Rate:  [83-90] 83 (02/24 1203) Resp:  [16-20] 16 (02/24 1203) BP: (154-192)/(46-75) 154/46 (02/24 1203) SpO2:  [95 %-98 %] 98 % (02/24 1349) Weight:  [140 lb 8 oz (63.7 kg)] 140 lb 8 oz (63.7 kg) (02/24 0500) Weight change: -8 oz (-0.227 kg) Last BM Date: 01/06/17  Intake/Output from previous day: 02/23 0701 - 02/24 0700 In: 1493.3 [P.O.:240; I.V.:1203.3; IV Piggyback:50] Out: -  No intake/output data recorded.   Physical Exam: General- pt is awake, following commands. Resp- No acute REsp distress,  Wheezing + CVS- S1S2 regular in rate and rhythm GIT- BS+, soft, NT, distended EXT- NO LE Edema, NO Cyanosis    Lab Results: CBC  Recent Labs  01/04/17 0614 01/05/17 0548  WBC 5.1 5.4  HGB 9.0* 8.9*  HCT 26.7* 26.8*  PLT 54* 53*    BMET  Recent Labs  01/05/17 0548 01/06/17 0852  NA 140 139  K 3.3* 4.4  CL 110 113*  CO2 25 21*  GLUCOSE 91 134*  BUN 29* 19  CREATININE 1.84* 1.30*  CALCIUM 7.5* 7.5*    Creat trend 2018  3.0=>3.3=>3.4=>2.49=>1.84=>1.30 2017   0.8--1.2 2016   0.7--1.2        MICRO Recent Results (from the past 240 hour(s))  Urine culture     Status: Abnormal   Collection Time: 01/01/17  8:58 PM  Result Value Ref Range Status   Specimen Description URINE, CATHETERIZED  Final   Special Requests NONE  Final   Culture >=100,000 COLONIES/mL ESCHERICHIA COLI (A)  Final   Report Status 01/04/2017 FINAL  Final   Organism ID, Bacteria ESCHERICHIA COLI (A)  Final      Susceptibility   Escherichia coli - MIC*    AMPICILLIN <=2 SENSITIVE Sensitive     CEFAZOLIN <=4 SENSITIVE Sensitive     CEFTRIAXONE <=1 SENSITIVE Sensitive     CIPROFLOXACIN >=4 RESISTANT Resistant     GENTAMICIN <=1 SENSITIVE Sensitive     IMIPENEM <=0.25 SENSITIVE Sensitive     NITROFURANTOIN <=16 SENSITIVE Sensitive     TRIMETH/SULFA <=20 SENSITIVE Sensitive     AMPICILLIN/SULBACTAM <=2 SENSITIVE Sensitive     PIP/TAZO <=4 SENSITIVE Sensitive     Extended ESBL NEGATIVE Sensitive     * >=100,000 COLONIES/mL ESCHERICHIA COLI  Culture, blood (Routine x 2)     Status: Abnormal   Collection Time: 01/01/17 10:01 PM  Result Value Ref  Range Status   Specimen Description BLOOD RIGHT ARM  Final   Special Requests BOTTLES DRAWN AEROBIC AND ANAEROBIC [facility] EACH  Final   Culture  Setup Time   Final    GRAM NEGATIVE RODS Gram Stain Report Called to,Read Back By and Verified With: FOLEY B. AT 0950 ON [id number] BY THOMPSON S. Organism ID to follow CRITICAL RESULT CALLED TO, READ BACK BY AND VERIFIED WITH: L. Seay Pharm.D. 16:30 01/02/17 ([email or web address]) Performed at [facility], [address] [place], [place] [address]    Culture ESCHERICHIA COLI (A)  Final   Report Status 01/04/2017 FINAL  Final   Organism ID, Bacteria ESCHERICHIA COLI  Final      Susceptibility   Escherichia coli - MIC*    AMPICILLIN <=2 SENSITIVE Sensitive     CEFAZOLIN <=4 SENSITIVE Sensitive     CEFEPIME <=1 SENSITIVE Sensitive     CEFTAZIDIME <=1 SENSITIVE Sensitive     CEFTRIAXONE <=1  SENSITIVE Sensitive     CIPROFLOXACIN >=4 RESISTANT Resistant     GENTAMICIN <=1 SENSITIVE Sensitive     IMIPENEM <=0.25 SENSITIVE Sensitive     TRIMETH/SULFA <=20 SENSITIVE Sensitive     AMPICILLIN/SULBACTAM <=2 SENSITIVE Sensitive     PIP/TAZO <=4 SENSITIVE Sensitive     Extended ESBL NEGATIVE Sensitive     * ESCHERICHIA COLI  Blood Culture ID Panel (Reflexed)     Status: Abnormal   Collection Time: 01/01/17 10:01 PM  Result Value Ref Range Status   Enterococcus species NOT DETECTED NOT DETECTED Final   Listeria monocytogenes NOT DETECTED NOT DETECTED Final   Staphylococcus species NOT DETECTED NOT DETECTED Final   Staphylococcus aureus NOT DETECTED NOT DETECTED Final   Streptococcus species NOT DETECTED NOT DETECTED Final   Streptococcus agalactiae NOT DETECTED NOT DETECTED Final   Streptococcus pneumoniae NOT DETECTED NOT DETECTED Final   Streptococcus pyogenes NOT DETECTED NOT DETECTED Final   Acinetobacter baumannii NOT DETECTED NOT DETECTED Final   Enterobacteriaceae species DETECTED (A) NOT DETECTED Final    Comment: Enterobacteriaceae represent a large family of gram-negative bacteria, not a single organism. CRITICAL RESULT CALLED TO, READ BACK BY AND VERIFIED WITH: L. Seay Pharm.D. 16:30 01/02/17 ([email or web address])    Enterobacter cloacae complex NOT DETECTED NOT DETECTED Final   Escherichia coli DETECTED (A) NOT DETECTED Final    Comment: CRITICAL RESULT CALLED TO, READ BACK BY AND VERIFIED WITH: L. Seay Pharm.D. 16:30 01/02/17 ([email or web address])    Klebsiella oxytoca NOT DETECTED NOT DETECTED Final   Klebsiella pneumoniae NOT DETECTED NOT DETECTED Final   Proteus species NOT DETECTED NOT DETECTED Final   Serratia marcescens NOT DETECTED NOT DETECTED Final   Carbapenem resistance NOT DETECTED NOT DETECTED Final   Haemophilus influenzae NOT DETECTED NOT DETECTED Final   Neisseria meningitidis NOT DETECTED NOT DETECTED Final   Pseudomonas aeruginosa NOT DETECTED NOT DETECTED Final    Candida albicans NOT DETECTED NOT DETECTED Final   Candida glabrata NOT DETECTED NOT DETECTED Final   Candida krusei NOT DETECTED NOT DETECTED Final   Candida parapsilosis NOT DETECTED NOT DETECTED Final   Candida tropicalis NOT DETECTED NOT DETECTED Final    Comment: Performed at [facility], [address] [place], [place] [address]  Culture, blood (Routine x 2)     Status: Abnormal   Collection Time: 01/01/17 10:10 PM  Result Value Ref Range Status   Specimen Description BLOOD RIGHT HAND  Final   Special Requests BOTTLES DRAWN AEROBIC AND ANAEROBIC [facility] EACH  Final   Culture  Setup Time   Final    GRAM NEGATIVE RODS Gram Stain Report Called to,Read Back By and Verified With: FOLEY B. AT 0950 ON [id number] BY THOMPSON S.    Culture (A)  Final    ESCHERICHIA COLI SUSCEPTIBILITIES PERFORMED ON PREVIOUS CULTURE WITHIN THE LAST 5 DAYS. Performed at [facility], [address] [place], [place] [id number]    Report Status 01/04/2017 FINAL  Final  Gastrointestinal Panel by PCR , Stool     Status: Abnormal   Collection Time: 01/01/17 11:55 PM  Result Value Ref Range Status   Campylobacter species NOT DETECTED NOT DETECTED Final   Plesimonas shigelloides NOT DETECTED NOT DETECTED Final   Salmonella species NOT DETECTED NOT DETECTED Final   Yersinia enterocolitica NOT DETECTED NOT DETECTED Final   Vibrio species NOT DETECTED NOT DETECTED Final   Vibrio cholerae NOT DETECTED NOT DETECTED Final   Enteroaggregative E coli (EAEC) NOT DETECTED NOT DETECTED Final   Enteropathogenic E coli (EPEC) DETECTED (A) NOT DETECTED Final    Comment: CRITICAL RESULT CALLED TO, READ BACK BY AND VERIFIED WITH: MORGAN DISHMAN AT 1747 ON 01/03/17 BY SNJ    Enterotoxigenic E coli (ETEC) NOT DETECTED NOT DETECTED Final   Shiga like toxin producing E coli (STEC) NOT DETECTED NOT DETECTED Final   Shigella/Enteroinvasive E coli (EIEC) NOT DETECTED NOT DETECTED Final   Cryptosporidium NOT DETECTED NOT  DETECTED Final   Cyclospora cayetanensis NOT DETECTED NOT DETECTED Final   Entamoeba histolytica NOT DETECTED NOT DETECTED Final   Giardia lamblia NOT DETECTED NOT DETECTED Final   Adenovirus F40/41 NOT DETECTED NOT DETECTED Final   Astrovirus NOT DETECTED NOT DETECTED Final   Norovirus GI/GII NOT DETECTED NOT DETECTED Final   Rotavirus A NOT DETECTED NOT DETECTED Final   Sapovirus (I, II, IV, and V) NOT DETECTED NOT DETECTED Final  C difficile quick scan w PCR reflex     Status: None   Collection Time: 01/01/17 11:55 PM  Result Value Ref Range Status   C Diff antigen NEGATIVE NEGATIVE Final   C Diff toxin NEGATIVE NEGATIVE Final   C Diff interpretation No C. difficile detected.  Final      Lab Results  Component Value Date   CALCIUM 7.5 (L) 01/06/2017   CAION 1.14 02/07/2009               Impression: 1)Renal  AKI secondary to ATN vs HUS                AKI sec to Hypovolemia/Hypotension               AKI sec to Sepsis/ACE               AKI on CKD               CKD stage 3 .               CKD since 2016               CKD secondary to Multiple  UTI/ Age associated decline                Progression of CKD now marked with AKI                    AKI now better                Creat trending down  2)CVS- hemodynamically  stable   3)Anemia HGb lower than before   4)Thmbocytopenia   Platelts counts trending lower  5)ID- admitted with gram negative sepsis.  On IV abx Primary MD following  6)Electrolytes  Hypokalemic Now better  NOrmonatremic   7)Acid base Co2 better Admitted with Lactic acidosis     Plan:  Will continue care      Kelce Bouton S 01/06/2017, 3:30 PM

## 2017-01-06 NOTE — Progress Notes (Signed)
Patient noted to have some wheezing this afternoon, new from earlier this today. O2 sats 98% on r/a, denies shortness of breath. Wheezing cleared after neb treatment this afternoon. Pt sleepy, but easily arousable. Assisted up to chair this afternoon, chair alarm on for safety. Call light and personal items in reach. Notified Dr. Kerry HoughMemon of status. Stated okay for discharge, will have neb treatments for at home as well. Earnstine RegalAshley Alani Lacivita, RN

## 2017-01-06 NOTE — Progress Notes (Signed)
Patient grand-daughter aware of discharge order, stated her husband has their only vehicle and had to leave unexpectedly. States they most likely would not be able to come until tomorrow morning. Notified Dr. Kerry HoughMemon. Stated patient may still d/c home tonight if they arrive, otherwise d/c in am. Discharge order to remain in place. Earnstine RegalAshley Duard Spiewak, RN

## 2017-01-06 NOTE — Discharge Summary (Addendum)
Physician Discharge Summary  CONCHA SUDOL ZOX:[id number] DOB: [birth year] DOA: 01/01/2017  PCP: Bennie Pierini, FNP  Admit date: 01/01/2017 Discharge date: 01/06/2017  Admitted From: Home Disposition:  Home  Recommendations for Outpatient Follow-up:  1. Follow up with PCP in 1-2 weeks 2. Please obtain BMP/CBC in one week 3. ACE inhibitor, metformin discontinued due to recent renal failure. Lasix changed to when necessary  Home Health:Home health RN and PT Equipment/Devices:DME none  Discharge Condition:Stable CODE STATUS:DO NOT RESUSCITATE Diet recommendation: Heart Healthy / Carb Modified, Dysphagia 2 diet with thin liquids  Brief/Interim Summary: 83 year old woman presented with watery diarrhea, chills, agitation, refusal to eat or drink. PMH recurrent UTIs. Initial evaluation revealed fever, lactic acidosis, acute kidney injury, thrombocytopenia, UTI. Treated and admitted for sepsis secondary to UTI.  Discharge Diagnoses:  Principal Problem:   Sepsis secondary to UTI [facility]) Active Problems:   Hypertension   Hypothyroidism   Diabetes mellitus type 2, controlled (HCC)   Dysphagia, pharyngoesophageal phase   Chronic diastolic CHF (congestive heart failure) (HCC)   Dementia   Acute kidney injury (HCC)   Watery diarrhea   Thrombocytopenia (HCC)   Bacteremia   Aspiration into airway  1. Sepsis secondary to E coli bacteremia, presuming from UTI with associated fever, lactic acidosis, thrombocytopenia and acute kidney injury on admission.  Patient was treated with intravenous ceftriaxone. Based on sensitivities, she was transitioned to Augmentin to complete her course. 2. Escherichia coli bacteremia. Treatment as above. 3. Acute kidney injury on CKD 3.  Likely prerenal azotemia from poor oral intake exacerbated by Lasix, lisinopril. Consider ATN from sepsis.  Renal ultrasound unremarkable. She was treated with IV fluids and renal function has significantly improved.  We'll continue to hold lisinopril until follow-up with primary care physician. Lasix has been changed to when necessary. Nephrology following. Will need repeat renal function in 1 week to ensure stability.  Thrombocytopenia secondary to sepsis. Platelets are low in the 50s, but appear to be stable for the last several days. LDH is only minimally elevated, Coombs test negative. Haptoglobin currently in process. Will need repeat CBC in 1 week to ensure improvement of thrombocytopenia. 4. V/D. stools positive for enteric pathogenic Escherichia coli. Treat supportively. Improving. 5. Dementia , without behavioral disturbance. 6. PMH chronic diastolic congestive heart failure, hypertension, hypothyroidism 7. Diabetes mellitus on insulin, blood sugar stable. Metformin discontinued due to renal failure. Blood sugars are in acceptable range. Restart NPH at lower dose on discharge. Continue to follow blood sugars at home. 8. Dysphagia with aspiration. Family reports that she often coughs after eating or drinking. Seen by speech therapy recommendations were for dysphagia 2 diet with thin liquids. Patient should be sitting up before and after meals. She should have meals with supervision. She does intermittently have shortness of breath and wheezing episodes for which she can receive bronchodilators. This is likely related to her intermittent aspiration.  Discharge Instructions  Discharge Instructions    DME Nebulizer machine    Complete by:  As directed    Patient needs a nebulizer to treat with the following condition:  Aspiration into airway   Diet - low sodium heart healthy    Complete by:  As directed    Increase activity slowly    Complete by:  As directed      Allergies as of 01/06/2017      Reactions   Sulfa Antibiotics Rash      Medication List    STOP taking these medications   lisinopril 20 MG  tablet Commonly known as:  PRINIVIL,ZESTRIL   meloxicam 15 MG tablet Commonly known as:   MOBIC   metFORMIN 1000 MG tablet Commonly known as:  GLUCOPHAGE   potassium chloride 10 MEQ tablet Commonly known as:  K-DUR     TAKE these medications   albuterol (2.5 MG/3ML) 0.083% nebulizer solution Commonly known as:  PROVENTIL Take 3 mLs (2.5 mg total) by nebulization every 4 (four) hours as needed for wheezing or shortness of breath.   amLODipine 5 MG tablet Commonly known as:  NORVASC TAKE ONE TABLET BY MOUTH ONCE DAILY MUST  BE  SEEN  FOR  ADDITIONAL  REFILLS What changed:  how much to take  how to take this  when to take this  additional instructions   amoxicillin-clavulanate 875-125 MG tablet Commonly known as:  AUGMENTIN Take 1 tablet by mouth every 12 (twelve) hours.   aspirin 81 MG tablet Take 81 mg by mouth daily.   carvedilol 25 MG tablet Commonly known as:  COREG TAKE ONE TABLET BY MOUTH TWICE DAILY WITH MEALS MUST  BE  SEEN  FOR  ADDITIONAL  REFILLS What changed:  how much to take  how to take this  when to take this  additional instructions   citalopram 20 MG tablet Commonly known as:  CELEXA TAKE ONE TABLET BY MOUTH ONCE DAILY MUST  BE  SEEN  FOR  ADDITIONAL  REFILLS What changed:  how much to take  how to take this  when to take this  additional instructions   fenofibrate 145 MG tablet Commonly known as:  TRICOR Take 1 tablet (145 mg total) by mouth daily.   fesoterodine 8 MG Tb24 tablet Commonly known as:  TOVIAZ Take 1 tablet (8 mg total) by mouth daily.   furosemide 20 MG tablet Commonly known as:  LASIX Take 2 tablets (40 mg total) by mouth daily as needed for fluid. What changed:  See the new instructions.   guaiFENesin 600 MG 12 hr tablet Commonly known as:  MUCINEX Take 1 tablet (600 mg total) by mouth 2 (two) times daily.   insulin NPH Human 100 UNIT/ML injection Commonly known as:  NOVOLIN N RELION Inject 0.1 mLs (10 Units total) into the skin daily before breakfast. What changed:  how much to take    levothyroxine 75 MCG tablet Commonly known as:  SYNTHROID, LEVOTHROID Take 1 tablet (75 mcg total) by mouth daily.   loperamide 2 MG tablet Commonly known as:  IMODIUM A-D Take 1 tablet (2 mg total) by mouth 4 (four) times daily as needed for diarrhea or loose stools.   memantine 10 MG tablet Commonly known as:  NAMENDA Take 1 tablet (10 mg total) by mouth 2 (two) times daily.   omeprazole 40 MG capsule Commonly known as:  PRILOSEC Take 1 capsule (40 mg total) by mouth daily.   ONETOUCH VERIO test strip Generic drug:  glucose blood USE TO CHECK GLUCOSE THREE TIMES DAILY            Durable Medical Equipment        Start     Ordered   01/06/17 0000  DME Nebulizer machine    Question:  Patient needs a nebulizer to treat with the following condition  Answer:  Aspiration into airway   01/06/17 1453     Follow-up Information    [facility] Follow up.   Contact information: [address] [place] [address] [phone]  Allergies  Allergen Reactions  . Sulfa Antibiotics Rash    Consultations:  Nephrology   Procedures/Studies: Dg Chest 2 View  Result Date: 01/01/2017 CLINICAL DATA:  Fever with low oxygen saturation EXAM: CHEST  2 VIEW COMPARISON:  05/29/2016 FINDINGS: There are low lung volumes. Tiny left pleural effusion with adjacent atelectasis. Mild cardiomegaly. No pneumothorax. Moderate compression deformity of thoracolumbar vertebra. IMPRESSION: Tiny left pleural effusion with adjacent atelectasis. Low lung volumes. Cardiomegaly without overt failure. Electronically Signed   By: Jasmine Pang M.D.   On: 01/01/2017 21:09   US Renal  Result Date: 01/02/2017 CLINICAL DATA:  83 year old with acute renal failure. Recurrent UTI and sepsis. EXAM: RENAL / URINARY TRACT ULTRASOUND COMPLETE COMPARISON:  Prior duplex 02/25/2008 FINDINGS: Right Kidney: Length: 11.6 cm. General renal cortical thinning. Echogenicity similar to  that of the adjacent liver. Flow in the hilum of the kidney. No hydronephrosis. No internal reflectors to indicate large stones. Left Kidney: Length: 13.2 cm. Gentle renal cortical thinning. Echogenicity similar that to the a adjacent spleen and symmetric to the right kidney. Flow in the hilum. No hydronephrosis or internal reflectors. Bladder: Partially distended. Ill-defined echogenic debris within the urinary bladder. IMPRESSION: No evidence of hydronephrosis. Bilateral renal cortical thinning may indicate medical renal disease. No bladder distention with echogenic debris, which is nonspecific. Correlation with urinalysis may be useful. Signed, Yvone Neu. Loreta Ave, DO [facility] Radiology Electronically Signed   By: Gilmer Mor D.O.   On: 01/02/2017 09:10   Dg Chest Port 1 View  Result Date: 01/03/2017 CLINICAL DATA:  Sepsis with wheezing EXAM: PORTABLE CHEST 1 VIEW COMPARISON:  01/01/2017 FINDINGS: Low lung volumes. Tiny bilateral effusions as before. Increasing atelectasis right base. Increased opacity at the left lung base. Mild cardiomegaly with central vascular congestion. No pneumothorax. IMPRESSION: 1. Stable cardiomegaly with mild central vascular congestion 2. Tiny bilateral pleural effusions. Increasing bibasilar atelectasis or infiltrates. Electronically Signed   By: Jasmine Pang M.D.   On: 01/03/2017 19:27      Subjective: confused  Discharge Exam: Vitals:   01/06/17 1203 01/06/17 1400  BP: (!) 154/46   Pulse: 83   Resp: 16   Temp:  99.9 F (37.7 C)   Vitals:   01/06/17 0959 01/06/17 1203 01/06/17 1349 01/06/17 1400  BP: (!) 191/71 (!) 154/46    Pulse: 87 83    Resp: 16 16    Temp: 99.8 F (37.7 C)   99.9 F (37.7 C)  TempSrc: Oral   Oral  SpO2: 97% 98% 98%   Weight:      Height:        General: Pt is alert, awake, not in acute distress Cardiovascular: RRR, S1/S2 +, no rubs, no gallops Respiratory: CTA bilaterally,  no wheezing, no rhonchi Abdominal: Soft, NT, ND, bowel sounds + Extremities: no edema, no cyanosis    The results of significant diagnostics from this hospitalization (including imaging, microbiology, ancillary and laboratory) are listed below for reference.     Microbiology: Recent Results (from the past 240 hour(s))  Urine culture     Status: Abnormal   Collection Time: 01/01/17  8:58 PM  Result Value Ref Range Status   Specimen Description URINE, CATHETERIZED  Final   Special Requests NONE  Final   Culture >=100,000 COLONIES/mL ESCHERICHIA COLI (A)  Final   Report Status 01/04/2017 FINAL  Final   Organism ID, Bacteria ESCHERICHIA COLI (A)  Final      Susceptibility   Escherichia coli -  MIC*    AMPICILLIN <=2 SENSITIVE Sensitive     CEFAZOLIN <=4 SENSITIVE Sensitive     CEFTRIAXONE <=1 SENSITIVE Sensitive     CIPROFLOXACIN >=4 RESISTANT Resistant     GENTAMICIN <=1 SENSITIVE Sensitive     IMIPENEM <=0.25 SENSITIVE Sensitive     NITROFURANTOIN <=16 SENSITIVE Sensitive     TRIMETH/SULFA <=20 SENSITIVE Sensitive     AMPICILLIN/SULBACTAM <=2 SENSITIVE Sensitive     PIP/TAZO <=4 SENSITIVE Sensitive     Extended ESBL NEGATIVE Sensitive     * >=100,000 COLONIES/mL ESCHERICHIA COLI  Culture, blood (Routine x 2)     Status: Abnormal   Collection Time: 01/01/17 10:01 PM  Result Value Ref Range Status   Specimen Description BLOOD RIGHT ARM  Final   Special Requests BOTTLES DRAWN AEROBIC AND ANAEROBIC [facility] EACH  Final   Culture  Setup Time   Final    GRAM NEGATIVE RODS Gram Stain Report Called to,Read Back By and Verified With: FOLEY B. AT 0950 ON [id number] BY THOMPSON S. Organism ID to follow CRITICAL RESULT CALLED TO, READ BACK BY AND VERIFIED WITH: L. Seay Pharm.D. 16:30 01/02/17 ([email or web address]) Performed at [facility], [address] [place], [place] [address]    Culture ESCHERICHIA COLI (A)  Final   Report Status 01/04/2017 FINAL  Final   Organism ID, Bacteria ESCHERICHIA  COLI  Final      Susceptibility   Escherichia coli - MIC*    AMPICILLIN <=2 SENSITIVE Sensitive     CEFAZOLIN <=4 SENSITIVE Sensitive     CEFEPIME <=1 SENSITIVE Sensitive     CEFTAZIDIME <=1 SENSITIVE Sensitive     CEFTRIAXONE <=1 SENSITIVE Sensitive     CIPROFLOXACIN >=4 RESISTANT Resistant     GENTAMICIN <=1 SENSITIVE Sensitive     IMIPENEM <=0.25 SENSITIVE Sensitive     TRIMETH/SULFA <=20 SENSITIVE Sensitive     AMPICILLIN/SULBACTAM <=2 SENSITIVE Sensitive     PIP/TAZO <=4 SENSITIVE Sensitive     Extended ESBL NEGATIVE Sensitive     * ESCHERICHIA COLI  Blood Culture ID Panel (Reflexed)     Status: Abnormal   Collection Time: 01/01/17 10:01 PM  Result Value Ref Range Status   Enterococcus species NOT DETECTED NOT DETECTED Final   Listeria monocytogenes NOT DETECTED NOT DETECTED Final   Staphylococcus species NOT DETECTED NOT DETECTED Final   Staphylococcus aureus NOT DETECTED NOT DETECTED Final   Streptococcus species NOT DETECTED NOT DETECTED Final   Streptococcus agalactiae NOT DETECTED NOT DETECTED Final   Streptococcus pneumoniae NOT DETECTED NOT DETECTED Final   Streptococcus pyogenes NOT DETECTED NOT DETECTED Final   Acinetobacter baumannii NOT DETECTED NOT DETECTED Final   Enterobacteriaceae species DETECTED (A) NOT DETECTED Final    Comment: Enterobacteriaceae represent a large family of gram-negative bacteria, not a single organism. CRITICAL RESULT CALLED TO, READ BACK BY AND VERIFIED WITH: L. Seay Pharm.D. 16:30 01/02/17 ([email or web address])    Enterobacter cloacae complex NOT DETECTED NOT DETECTED Final   Escherichia coli DETECTED (A) NOT DETECTED Final    Comment: CRITICAL RESULT CALLED TO, READ BACK BY AND VERIFIED WITH: L. Seay Pharm.D. 16:30 01/02/17 ([email or web address])    Klebsiella oxytoca NOT DETECTED NOT DETECTED Final   Klebsiella pneumoniae NOT DETECTED NOT DETECTED Final   Proteus species NOT DETECTED NOT DETECTED Final   Serratia marcescens NOT DETECTED NOT DETECTED Final    Carbapenem resistance NOT DETECTED NOT DETECTED Final   Haemophilus influenzae NOT DETECTED NOT DETECTED Final   Neisseria meningitidis NOT  DETECTED NOT DETECTED Final   Pseudomonas aeruginosa NOT DETECTED NOT DETECTED Final   Candida albicans NOT DETECTED NOT DETECTED Final   Candida glabrata NOT DETECTED NOT DETECTED Final   Candida krusei NOT DETECTED NOT DETECTED Final   Candida parapsilosis NOT DETECTED NOT DETECTED Final   Candida tropicalis NOT DETECTED NOT DETECTED Final    Comment: Performed at [facility], [address], [place], [place] [address]  Culture, blood (Routine x 2)     Status: Abnormal   Collection Time: 01/01/17 10:10 PM  Result Value Ref Range Status   Specimen Description BLOOD RIGHT HAND  Final   Special Requests BOTTLES DRAWN AEROBIC AND ANAEROBIC [facility] EACH  Final   Culture  Setup Time   Final    GRAM NEGATIVE RODS Gram Stain Report Called to,Read Back By and Verified With: FOLEY B. AT 0950 ON [id number] BY THOMPSON S.    Culture (A)  Final    ESCHERICHIA COLI SUSCEPTIBILITIES PERFORMED ON PREVIOUS CULTURE WITHIN THE LAST 5 DAYS. Performed at [facility], [address], [place], [place] [address]    Report Status 01/04/2017 FINAL  Final  Gastrointestinal Panel by PCR , Stool     Status: Abnormal   Collection Time: 01/01/17 11:55 PM  Result Value Ref Range Status   Campylobacter species NOT DETECTED NOT DETECTED Final   Plesimonas shigelloides NOT DETECTED NOT DETECTED Final   Salmonella species NOT DETECTED NOT DETECTED Final   Yersinia enterocolitica NOT DETECTED NOT DETECTED Final   Vibrio species NOT DETECTED NOT DETECTED Final   Vibrio cholerae NOT DETECTED NOT DETECTED Final   Enteroaggregative E coli (EAEC) NOT DETECTED NOT DETECTED Final   Enteropathogenic E coli (EPEC) DETECTED (A) NOT DETECTED Final    Comment: CRITICAL RESULT CALLED TO, READ BACK BY AND VERIFIED WITH: MORGAN DISHMAN AT 1747 ON 01/03/17 BY SNJ     Enterotoxigenic E coli (ETEC) NOT DETECTED NOT DETECTED Final   Shiga like toxin producing E coli (STEC) NOT DETECTED NOT DETECTED Final   Shigella/Enteroinvasive E coli (EIEC) NOT DETECTED NOT DETECTED Final   Cryptosporidium NOT DETECTED NOT DETECTED Final   Cyclospora cayetanensis NOT DETECTED NOT DETECTED Final   Entamoeba histolytica NOT DETECTED NOT DETECTED Final   Giardia lamblia NOT DETECTED NOT DETECTED Final   Adenovirus F40/41 NOT DETECTED NOT DETECTED Final   Astrovirus NOT DETECTED NOT DETECTED Final   Norovirus GI/GII NOT DETECTED NOT DETECTED Final   Rotavirus A NOT DETECTED NOT DETECTED Final   Sapovirus (I, II, IV, and V) NOT DETECTED NOT DETECTED Final  C difficile quick scan w PCR reflex     Status: None   Collection Time: 01/01/17 11:55 PM  Result Value Ref Range Status   C Diff antigen NEGATIVE NEGATIVE Final   C Diff toxin NEGATIVE NEGATIVE Final   C Diff interpretation No C. difficile detected.  Final     Labs: BNP (last 3 results) No results for input(s): BNP in the last 8760 hours. Basic Metabolic Panel:  Recent Labs Lab 01/02/17 0547 01/03/17 0525 01/04/17 0614 01/05/17 0548 01/06/17 0852  NA 140 142 143 140 139  K 4.0 4.5 3.6 3.3* 4.4  CL 110 113* 116* 110 113*  CO2 22 20* 21* 25 21*  GLUCOSE 105* 121* 109* 91 134*  BUN 46* 55* 44* 29* 19  CREATININE 3.31* 3.44* 2.49* 1.84* 1.30*  CALCIUM 7.9* 7.4* 7.3* 7.5* 7.5*   Liver Function Tests:  Recent Labs Lab  01/01/17 2133 01/02/17 0211  AST 51* 43*  ALT 21 20  ALKPHOS 25* 19*  BILITOT 0.7 0.6  PROT 6.6 5.0*  ALBUMIN 3.5 2.6*   No results for input(s): LIPASE, AMYLASE in the last 168 hours. No results for input(s): AMMONIA in the last 168 hours. CBC:  Recent Labs Lab 01/01/17 2133 01/02/17 0211 01/02/17 0547 01/03/17 0525 01/04/17 0614 01/05/17 0548  WBC 7.3 4.8 5.1 6.4 5.1 5.4  NEUTROABS 5.5 3.6  --   --  3.1  --   HGB 12.6 9.9* 9.8* 10.0* 9.0* 8.9*  HCT 37.9 29.7* 30.7*  32.2* 26.7* 26.8*  MCV 93.3 94.3 95.6 97.9 93.0 92.4  PLT 77* 56* 54* 53* 54* 53*   Cardiac Enzymes: No results for input(s): CKTOTAL, CKMB, CKMBINDEX, TROPONINI in the last 168 hours. BNP: Invalid input(s): POCBNP CBG:  Recent Labs Lab 01/05/17 2028 01/06/17 0005 01/06/17 0431 01/06/17 0753 01/06/17 1147  GLUCAP 116* 116* 107* 94 130*   D-Dimer No results for input(s): DDIMER in the last 72 hours. Hgb A1c No results for input(s): HGBA1C in the last 72 hours. Lipid Profile No results for input(s): CHOL, HDL, LDLCALC, TRIG, CHOLHDL, LDLDIRECT in the last 72 hours. Thyroid function studies No results for input(s): TSH, T4TOTAL, T3FREE, THYROIDAB in the last 72 hours.  Invalid input(s): FREET3 Anemia work up No results for input(s): VITAMINB12, FOLATE, FERRITIN, TIBC, IRON, RETICCTPCT in the last 72 hours. Urinalysis    Component Value Date/Time   COLORURINE YELLOW 01/01/2017 2028   APPEARANCEUR TURBID (A) 01/01/2017 2028   APPEARANCEUR Cloudy (A) 11/08/2016 1424   LABSPEC 1.015 01/01/2017 2028   PHURINE 5.0 01/01/2017 2028   GLUCOSEU NEGATIVE 01/01/2017 2028   HGBUR MODERATE (A) 01/01/2017 2028   BILIRUBINUR NEGATIVE 01/01/2017 2028   BILIRUBINUR Negative 11/08/2016 1424   KETONESUR NEGATIVE 01/01/2017 2028   PROTEINUR 100 (A) 01/01/2017 2028   UROBILINOGEN negative 01/03/2016 1451   UROBILINOGEN 1.0 09/17/2015 2300   NITRITE NEGATIVE 01/01/2017 2028   LEUKOCYTESUR MODERATE (A) 01/01/2017 2028   LEUKOCYTESUR 3+ (A) 11/08/2016 1424   Sepsis Labs Invalid input(s): PROCALCITONIN,  WBC,  LACTICIDVEN Microbiology Recent Results (from the past 240 hour(s))  Urine culture     Status: Abnormal   Collection Time: 01/01/17  8:58 PM  Result Value Ref Range Status   Specimen Description URINE, CATHETERIZED  Final   Special Requests NONE  Final   Culture >=100,000 COLONIES/mL ESCHERICHIA COLI (A)  Final   Report Status 01/04/2017 FINAL  Final   Organism ID, Bacteria  ESCHERICHIA COLI (A)  Final      Susceptibility   Escherichia coli - MIC*    AMPICILLIN <=2 SENSITIVE Sensitive     CEFAZOLIN <=4 SENSITIVE Sensitive     CEFTRIAXONE <=1 SENSITIVE Sensitive     CIPROFLOXACIN >=4 RESISTANT Resistant     GENTAMICIN <=1 SENSITIVE Sensitive     IMIPENEM <=0.25 SENSITIVE Sensitive     NITROFURANTOIN <=16 SENSITIVE Sensitive     TRIMETH/SULFA <=20 SENSITIVE Sensitive     AMPICILLIN/SULBACTAM <=2 SENSITIVE Sensitive     PIP/TAZO <=4 SENSITIVE Sensitive     Extended ESBL NEGATIVE Sensitive     * >=100,000 COLONIES/mL ESCHERICHIA COLI  Culture, blood (Routine x 2)     Status: Abnormal   Collection Time: 01/01/17 10:01 PM  Result Value Ref Range Status   Specimen Description BLOOD RIGHT ARM  Final   Special Requests BOTTLES DRAWN AEROBIC AND ANAEROBIC [facility] EACH  Final   Culture  Setup Time   Final    GRAM NEGATIVE RODS Gram Stain Report Called to,Read Back By and Verified With: FOLEY B. AT 0950 ON [id number] BY THOMPSON S. Organism ID to follow CRITICAL RESULT CALLED TO, READ BACK BY AND VERIFIED WITH: L. Seay Pharm.D. 16:30 01/02/17 ([email or web address]) Performed at [facility], [address] [place], [place] [address]    Culture ESCHERICHIA COLI (A)  Final   Report Status 01/04/2017 FINAL  Final   Organism ID, Bacteria ESCHERICHIA COLI  Final      Susceptibility   Escherichia coli - MIC*    AMPICILLIN <=2 SENSITIVE Sensitive     CEFAZOLIN <=4 SENSITIVE Sensitive     CEFEPIME <=1 SENSITIVE Sensitive     CEFTAZIDIME <=1 SENSITIVE Sensitive     CEFTRIAXONE <=1 SENSITIVE Sensitive     CIPROFLOXACIN >=4 RESISTANT Resistant     GENTAMICIN <=1 SENSITIVE Sensitive     IMIPENEM <=0.25 SENSITIVE Sensitive     TRIMETH/SULFA <=20 SENSITIVE Sensitive     AMPICILLIN/SULBACTAM <=2 SENSITIVE Sensitive     PIP/TAZO <=4 SENSITIVE Sensitive     Extended ESBL NEGATIVE Sensitive     * ESCHERICHIA COLI  Blood Culture ID Panel (Reflexed)     Status: Abnormal   Collection  Time: 01/01/17 10:01 PM  Result Value Ref Range Status   Enterococcus species NOT DETECTED NOT DETECTED Final   Listeria monocytogenes NOT DETECTED NOT DETECTED Final   Staphylococcus species NOT DETECTED NOT DETECTED Final   Staphylococcus aureus NOT DETECTED NOT DETECTED Final   Streptococcus species NOT DETECTED NOT DETECTED Final   Streptococcus agalactiae NOT DETECTED NOT DETECTED Final   Streptococcus pneumoniae NOT DETECTED NOT DETECTED Final   Streptococcus pyogenes NOT DETECTED NOT DETECTED Final   Acinetobacter baumannii NOT DETECTED NOT DETECTED Final   Enterobacteriaceae species DETECTED (A) NOT DETECTED Final    Comment: Enterobacteriaceae represent a large family of gram-negative bacteria, not a single organism. CRITICAL RESULT CALLED TO, READ BACK BY AND VERIFIED WITH: L. Seay Pharm.D. 16:30 01/02/17 (wilsonm)    Enterobacter cloacae complex NOT DETECTED NOT DETECTED Final   Escherichia coli DETECTED (A) NOT DETECTED Final    Comment: CRITICAL RESULT CALLED TO, READ BACK BY AND VERIFIED WITH: L. Seay Pharm.D. 16:30 01/02/17 (wilsonm)    Klebsiella oxytoca NOT DETECTED NOT DETECTED Final   Klebsiella pneumoniae NOT DETECTED NOT DETECTED Final   Proteus species NOT DETECTED NOT DETECTED Final   Serratia marcescens NOT DETECTED NOT DETECTED Final   Carbapenem resistance NOT DETECTED NOT DETECTED Final   Haemophilus influenzae NOT DETECTED NOT DETECTED Final   Neisseria meningitidis NOT DETECTED NOT DETECTED Final   Pseudomonas aeruginosa NOT DETECTED NOT DETECTED Final   Candida albicans NOT DETECTED NOT DETECTED Final   Candida glabrata NOT DETECTED NOT DETECTED Final   Candida krusei NOT DETECTED NOT DETECTED Final   Candida parapsilosis NOT DETECTED NOT DETECTED Final   Candida tropicalis NOT DETECTED NOT DETECTED Final    Comment: Performed at [facility], [address] [place], [place] [address]  Culture, blood (Routine x 2)     Status: Abnormal    Collection Time: 01/01/17 10:10 PM  Result Value Ref Range Status   Specimen Description BLOOD RIGHT HAND  Final   Special Requests BOTTLES DRAWN AEROBIC AND ANAEROBIC [facility] EACH  Final   Culture  Setup Time   Final    GRAM NEGATIVE RODS Gram Stain Report Called to,Read Back By and Verified With: FOLEY  B. AT 0950 ON [id number] BY THOMPSON S.    Culture (A)  Final    ESCHERICHIA COLI SUSCEPTIBILITIES PERFORMED ON PREVIOUS CULTURE WITHIN THE LAST 5 DAYS. Performed at [facility], [address] [place], [place] [address]    Report Status 01/04/2017 FINAL  Final  Gastrointestinal Panel by PCR , Stool     Status: Abnormal   Collection Time: 01/01/17 11:55 PM  Result Value Ref Range Status   Campylobacter species NOT DETECTED NOT DETECTED Final   Plesimonas shigelloides NOT DETECTED NOT DETECTED Final   Salmonella species NOT DETECTED NOT DETECTED Final   Yersinia enterocolitica NOT DETECTED NOT DETECTED Final   Vibrio species NOT DETECTED NOT DETECTED Final   Vibrio cholerae NOT DETECTED NOT DETECTED Final   Enteroaggregative E coli (EAEC) NOT DETECTED NOT DETECTED Final   Enteropathogenic E coli (EPEC) DETECTED (A) NOT DETECTED Final    Comment: CRITICAL RESULT CALLED TO, READ BACK BY AND VERIFIED WITH: MORGAN DISHMAN AT 1747 ON 01/03/17 BY SNJ    Enterotoxigenic E coli (ETEC) NOT DETECTED NOT DETECTED Final   Shiga like toxin producing E coli (STEC) NOT DETECTED NOT DETECTED Final   Shigella/Enteroinvasive E coli (EIEC) NOT DETECTED NOT DETECTED Final   Cryptosporidium NOT DETECTED NOT DETECTED Final   Cyclospora cayetanensis NOT DETECTED NOT DETECTED Final   Entamoeba histolytica NOT DETECTED NOT DETECTED Final   Giardia lamblia NOT DETECTED NOT DETECTED Final   Adenovirus F40/41 NOT DETECTED NOT DETECTED Final   Astrovirus NOT DETECTED NOT DETECTED Final   Norovirus GI/GII NOT DETECTED NOT DETECTED Final   Rotavirus A NOT DETECTED NOT DETECTED Final   Sapovirus (I, II, IV,  and V) NOT DETECTED NOT DETECTED Final  C difficile quick scan w PCR reflex     Status: None   Collection Time: 01/01/17 11:55 PM  Result Value Ref Range Status   C Diff antigen NEGATIVE NEGATIVE Final   C Diff toxin NEGATIVE NEGATIVE Final   C Diff interpretation No C. difficile detected.  Final     Time coordinating discharge: Over 30 minutes  SIGNED:   Erick Blinks, MD  [facility] 01/06/2017, 3:24 PM Pager   If 7PM-7AM, please contact night-coverage [email or web address] Password [email or web address]

## 2017-01-07 DIAGNOSIS — T17908D Unspecified foreign body in respiratory tract, part unspecified causing other injury, subsequent encounter: Secondary | ICD-10-CM

## 2017-01-07 LAB — GLUCOSE, CAPILLARY
GLUCOSE-CAPILLARY: 91 mg/dL (ref 65–99)
GLUCOSE-CAPILLARY: 134 mg/dL — AB (ref 65–99)
Glucose-Capillary: 103 mg/dL — ABNORMAL HIGH (ref 65–99)
Glucose-Capillary: 116 mg/dL — ABNORMAL HIGH (ref 65–99)

## 2017-01-07 MED ORDER — IPRATROPIUM-ALBUTEROL 0.5-2.5 (3) MG/3ML IN SOLN
3.0000 mL | Freq: Three times a day (TID) | RESPIRATORY_TRACT | Status: DC
  Administered 2017-01-07 (×2): 3 mL via RESPIRATORY_TRACT
  Filled 2017-01-07 (×2): qty 3

## 2017-01-07 NOTE — Progress Notes (Signed)
Jamie Cochran  MRN: [id number]  DOB/AGE: 1930/03/27 83 y.o.  Primary Care Physician:Mary-Margaret Daphine Deutscher, FNP  Admit date: 01/01/2017  Chief Complaint:  Chief Complaint  Patient presents with  . Emesis    S-Pt presented on  01/01/2017 with  Chief Complaint  Patient presents with  . Emesis  .    Pt offers no new complaints.   Meds . amLODipine  5 mg Oral Daily  . amoxicillin-clavulanate  1 tablet Oral Q12H  . carvedilol  25 mg Oral BID WC  . citalopram  20 mg Oral Daily  . guaiFENesin  1,200 mg Oral BID  . insulin aspart  0-15 Units Subcutaneous Q4H  . ipratropium-albuterol  3 mL Nebulization TID  . levothyroxine  75 mcg Oral QAC breakfast  . mouth rinse  15 mL Mouth Rinse BID  . memantine  10 mg Oral BID  . pantoprazole  40 mg Oral Daily      Physical Exam: Vital signs in last 24 hours: Temp:  [98.3 F (36.8 C)-99.9 F (37.7 C)] 99.5 F (37.5 C) (02/25 0526) Pulse Rate:  [59-80] 80 (02/25 0526) Resp:  [16-18] 16 (02/25 0526) BP: (140-158)/(56-67) 158/67 (02/25 0526) SpO2:  [92 %-98 %] 95 % (02/25 1313) Weight:  [140 lb (63.5 kg)] 140 lb (63.5 kg) (02/25 0526) Weight change: -8 oz (-0.227 kg) Last BM Date: 01/06/17  Intake/Output from previous day: 02/24 0701 - 02/25 0700 In: 240 [P.O.:240] Out: -  Total I/O In: 360 [P.O.:360] Out: -    Physical Exam: General- pt is awake, following commands. Resp- No acute REsp distress,  Wheezing + CVS- S1S2 regular in rate and rhythm GIT- BS+, soft, NT, distended EXT- NO LE Edema, NO Cyanosis    Lab Results: CBC  Recent Labs  01/05/17 0548  WBC 5.4  HGB 8.9*  HCT 26.8*  PLT 53*    BMET  Recent Labs  01/05/17 0548 01/06/17 0852  NA 140 139  K 3.3* 4.4  CL 110 113*  CO2 25 21*  GLUCOSE 91 134*  BUN 29* 19  CREATININE 1.84* 1.30*  CALCIUM 7.5* 7.5*    Creat trend 2018  3.0=>3.3=>3.4=>2.49=>1.84=>1.30 2017  0.8--1.2 2016   0.7--1.2        MICRO Recent Results (from the  past 240 hour(s))  Urine culture     Status: Abnormal   Collection Time: 01/01/17  8:58 PM  Result Value Ref Range Status   Specimen Description URINE, CATHETERIZED  Final   Special Requests NONE  Final   Culture >=100,000 COLONIES/mL ESCHERICHIA COLI (A)  Final   Report Status 01/04/2017 FINAL  Final   Organism ID, Bacteria ESCHERICHIA COLI (A)  Final      Susceptibility   Escherichia coli - MIC*    AMPICILLIN <=2 SENSITIVE Sensitive     CEFAZOLIN <=4 SENSITIVE Sensitive     CEFTRIAXONE <=1 SENSITIVE Sensitive     CIPROFLOXACIN >=4 RESISTANT Resistant     GENTAMICIN <=1 SENSITIVE Sensitive     IMIPENEM <=0.25 SENSITIVE Sensitive     NITROFURANTOIN <=16 SENSITIVE Sensitive     TRIMETH/SULFA <=20 SENSITIVE Sensitive     AMPICILLIN/SULBACTAM <=2 SENSITIVE Sensitive     PIP/TAZO <=4 SENSITIVE Sensitive     Extended ESBL NEGATIVE Sensitive     * >=100,000 COLONIES/mL ESCHERICHIA COLI  Culture, blood (Routine x 2)     Status: Abnormal   Collection Time: 01/01/17 10:01 PM  Result Value Ref Range Status   Specimen Description BLOOD RIGHT ARM  Final   Special Requests BOTTLES DRAWN AEROBIC AND ANAEROBIC [facility] EACH  Final   Culture  Setup Time   Final    GRAM NEGATIVE RODS Gram Stain Report Called to,Read Back By and Verified With: FOLEY B. AT 0950 ON [id number] BY THOMPSON S. Organism ID to follow CRITICAL RESULT CALLED TO, READ BACK BY AND VERIFIED WITH: L. Seay Pharm.D. 16:30 01/02/17 ([email or web address]) Performed at [facility], [address], [place], [place] [address]    Culture ESCHERICHIA COLI (A)  Final   Report Status 01/04/2017 FINAL  Final   Organism ID, Bacteria ESCHERICHIA COLI  Final      Susceptibility   Escherichia coli - MIC*    AMPICILLIN <=2 SENSITIVE Sensitive     CEFAZOLIN <=4 SENSITIVE Sensitive     CEFEPIME <=1 SENSITIVE Sensitive     CEFTAZIDIME <=1 SENSITIVE Sensitive     CEFTRIAXONE <=1 SENSITIVE Sensitive     CIPROFLOXACIN >=4 RESISTANT Resistant      GENTAMICIN <=1 SENSITIVE Sensitive     IMIPENEM <=0.25 SENSITIVE Sensitive     TRIMETH/SULFA <=20 SENSITIVE Sensitive     AMPICILLIN/SULBACTAM <=2 SENSITIVE Sensitive     PIP/TAZO <=4 SENSITIVE Sensitive     Extended ESBL NEGATIVE Sensitive     * ESCHERICHIA COLI  Blood Culture ID Panel (Reflexed)     Status: Abnormal   Collection Time: 01/01/17 10:01 PM  Result Value Ref Range Status   Enterococcus species NOT DETECTED NOT DETECTED Final   Listeria monocytogenes NOT DETECTED NOT DETECTED Final   Staphylococcus species NOT DETECTED NOT DETECTED Final   Staphylococcus aureus NOT DETECTED NOT DETECTED Final   Streptococcus species NOT DETECTED NOT DETECTED Final   Streptococcus agalactiae NOT DETECTED NOT DETECTED Final   Streptococcus pneumoniae NOT DETECTED NOT DETECTED Final   Streptococcus pyogenes NOT DETECTED NOT DETECTED Final   Acinetobacter baumannii NOT DETECTED NOT DETECTED Final   Enterobacteriaceae species DETECTED (A) NOT DETECTED Final    Comment: Enterobacteriaceae represent a large family of gram-negative bacteria, not a single organism. CRITICAL RESULT CALLED TO, READ BACK BY AND VERIFIED WITH: L. Seay Pharm.D. 16:30 01/02/17 (wilsonm)    Enterobacter cloacae complex NOT DETECTED NOT DETECTED Final   Escherichia coli DETECTED (A) NOT DETECTED Final    Comment: CRITICAL RESULT CALLED TO, READ BACK BY AND VERIFIED WITH: L. Seay Pharm.D. 16:30 01/02/17 (wilsonm)    Klebsiella oxytoca NOT DETECTED NOT DETECTED Final   Klebsiella pneumoniae NOT DETECTED NOT DETECTED Final   Proteus species NOT DETECTED NOT DETECTED Final   Serratia marcescens NOT DETECTED NOT DETECTED Final   Carbapenem resistance NOT DETECTED NOT DETECTED Final   Haemophilus influenzae NOT DETECTED NOT DETECTED Final   Neisseria meningitidis NOT DETECTED NOT DETECTED Final   Pseudomonas aeruginosa NOT DETECTED NOT DETECTED Final   Candida albicans NOT DETECTED NOT DETECTED Final   Candida glabrata NOT  DETECTED NOT DETECTED Final   Candida krusei NOT DETECTED NOT DETECTED Final   Candida parapsilosis NOT DETECTED NOT DETECTED Final   Candida tropicalis NOT DETECTED NOT DETECTED Final    Comment: Performed at [facility], [address] [place], [place] [address]  Culture, blood (Routine x 2)     Status: Abnormal   Collection Time: 01/01/17 10:10 PM  Result Value Ref Range Status   Specimen Description BLOOD RIGHT HAND  Final   Special Requests BOTTLES DRAWN AEROBIC AND ANAEROBIC [facility] EACH  Final   Culture  Setup Time  Final    GRAM NEGATIVE RODS Gram Stain Report Called to,Read Back By and Verified With: FOLEY B. AT 0950 ON [id number] BY THOMPSON S.    Culture (A)  Final    ESCHERICHIA COLI SUSCEPTIBILITIES PERFORMED ON PREVIOUS CULTURE WITHIN THE LAST 5 DAYS. Performed at [facility], [address] [place], [place] [address]    Report Status 01/04/2017 FINAL  Final  Gastrointestinal Panel by PCR , Stool     Status: Abnormal   Collection Time: 01/01/17 11:55 PM  Result Value Ref Range Status   Campylobacter species NOT DETECTED NOT DETECTED Final   Plesimonas shigelloides NOT DETECTED NOT DETECTED Final   Salmonella species NOT DETECTED NOT DETECTED Final   Yersinia enterocolitica NOT DETECTED NOT DETECTED Final   Vibrio species NOT DETECTED NOT DETECTED Final   Vibrio cholerae NOT DETECTED NOT DETECTED Final   Enteroaggregative E coli (EAEC) NOT DETECTED NOT DETECTED Final   Enteropathogenic E coli (EPEC) DETECTED (A) NOT DETECTED Final    Comment: CRITICAL RESULT CALLED TO, READ BACK BY AND VERIFIED WITH: MORGAN DISHMAN AT 1747 ON 01/03/17 BY SNJ    Enterotoxigenic E coli (ETEC) NOT DETECTED NOT DETECTED Final   Shiga like toxin producing E coli (STEC) NOT DETECTED NOT DETECTED Final   Shigella/Enteroinvasive E coli (EIEC) NOT DETECTED NOT DETECTED Final   Cryptosporidium NOT DETECTED NOT DETECTED Final   Cyclospora cayetanensis NOT DETECTED NOT DETECTED Final    Entamoeba histolytica NOT DETECTED NOT DETECTED Final   Giardia lamblia NOT DETECTED NOT DETECTED Final   Adenovirus F40/41 NOT DETECTED NOT DETECTED Final   Astrovirus NOT DETECTED NOT DETECTED Final   Norovirus GI/GII NOT DETECTED NOT DETECTED Final   Rotavirus A NOT DETECTED NOT DETECTED Final   Sapovirus (I, II, IV, and V) NOT DETECTED NOT DETECTED Final  C difficile quick scan w PCR reflex     Status: None   Collection Time: 01/01/17 11:55 PM  Result Value Ref Range Status   C Diff antigen NEGATIVE NEGATIVE Final   C Diff toxin NEGATIVE NEGATIVE Final   C Diff interpretation No C. difficile detected.  Final      Lab Results  Component Value Date   CALCIUM 7.5 (L) 01/06/2017   CAION 1.14 02/07/2009               Impression: 1)Renal  AKI secondary to ATN vs HUS                AKI sec to Hypovolemia/Hypotension               AKI sec to Sepsis/ACE               AKI on CKD               CKD stage 3 .               CKD since 2016               CKD secondary to Multiple  UTI/ Age associated decline                Progression of CKD now marked with AKI                    AKI now better                Creat trending down  2)CVS- hemodynamically stable   3)Anemia HGb lower than before  4)Thmbocytopenia   Platelts counts trending lower  5)ID- admitted with gram negative sepsis.  On IV abx Primary MD following  6)Electrolytes  Hypokalemic Now better  NOrmonatremic   7)Acid base Co2 better Admitted with Lactic acidosis     Plan:  Will continue care      Brad Lieurance S 01/07/2017, 1:38 PM

## 2017-01-07 NOTE — Progress Notes (Signed)
Pt IV removed ,WNL. D/C instructions given to pt and family members. Had multiple questions, answered the best possible. Family is here to take pt home. Will call [facility] when pt is D/C.

## 2017-01-07 NOTE — Progress Notes (Signed)
Patient was discharged from the hospital yesterday, but her family was unable to take her home.  Patient was seen and examined today. Her condition appears unchanged from yesterday. Plan on discharge is unchanged from yesterday and has been outlined in the discharge summary done yesterday. No change in medications.  MEMON,JEHANZEB

## 2017-01-09 ENCOUNTER — Telehealth: Payer: Self-pay | Admitting: Nurse Practitioner

## 2017-01-10 ENCOUNTER — Other Ambulatory Visit: Payer: Self-pay | Admitting: Nurse Practitioner

## 2017-01-10 ENCOUNTER — Encounter: Payer: Self-pay | Admitting: Nurse Practitioner

## 2017-01-10 ENCOUNTER — Ambulatory Visit (INDEPENDENT_AMBULATORY_CARE_PROVIDER_SITE_OTHER): Payer: Medicare Other

## 2017-01-10 ENCOUNTER — Ambulatory Visit (INDEPENDENT_AMBULATORY_CARE_PROVIDER_SITE_OTHER): Payer: Medicare Other | Admitting: Nurse Practitioner

## 2017-01-10 VITALS — BP 128/53 | HR 64 | Temp 97.4°F | Ht 60.0 in | Wt 140.0 lb

## 2017-01-10 DIAGNOSIS — R609 Edema, unspecified: Secondary | ICD-10-CM | POA: Diagnosis not present

## 2017-01-10 MED ORDER — FUROSEMIDE 40 MG PO TABS: 40.0000 mg | ORAL_TABLET | Freq: Every day | ORAL | 3 refills | Status: DC

## 2017-01-10 NOTE — Patient Instructions (Signed)
Edema Edema is when you have too much fluid in your body or under your skin. Edema may make your legs, feet, and ankles swell up. Swelling is also common in looser tissues, like around your eyes. This is a common condition. It gets more common as you get older. There are many possible causes of edema. Eating too much salt (sodium) and being on your feet or sitting for a long time can cause edema in your legs, feet, and ankles. Hot weather may make edema worse. Edema is usually painless. Your skin may look swollen or shiny. Follow these instructions at home:  Keep the swollen body part raised (elevated) above the level of your heart when you are sitting or lying down.  Do not sit still or stand for a long time.  Do not wear tight clothes. Do not wear garters on your upper legs.  Exercise your legs. This can help the swelling go down.  Wear elastic bandages or support stockings as told by your doctor.  Eat a low-salt (low-sodium) diet to reduce fluid as told by your doctor.  Depending on the cause of your swelling, you may need to limit how much fluid you drink (fluid restriction).  Take over-the-counter and prescription medicines only as told by your doctor. Contact a doctor if:  Treatment is not working.  You have heart, liver, or kidney disease and have symptoms of edema.  You have sudden and unexplained weight gain. Get help right away if:  You have shortness of breath or chest pain.  You cannot breathe when you lie down.  You have pain, redness, or warmth in the swollen areas.  You have heart, liver, or kidney disease and get edema all of a sudden.  You have a fever and your symptoms get worse all of a sudden. Summary  Edema is when you have too much fluid in your body or under your skin.  Edema may make your legs, feet, and ankles swell up. Swelling is also common in looser tissues, like around your eyes.  Raise (elevate) the swollen body part above the level of your  heart when you are sitting or lying down.  Follow your doctor's instructions about diet and how much fluid you can drink (fluid restriction). This information is not intended to replace advice given to you by your health care provider. Make sure you discuss any questions you have with your health care provider. Document Released: 04/17/2008 Document Revised: 11/17/2016 Document Reviewed: 11/17/2016 Elsevier Interactive Patient Education  2017 Elsevier Inc.  

## 2017-01-10 NOTE — Telephone Encounter (Signed)
Granddaughter called and aware as of the visit today - MMM did not change the insulin - she changed the Lasix (for edema)  Aware of notes and directions

## 2017-01-10 NOTE — Progress Notes (Signed)
   Subjective:    Patient ID: Jamie Cochran, female    DOB: Jan 20, 1930, 83 y.o.   MRN: [id number]  HPI Patient brought in by her granddaughter whom she lives with. Home health called yesterday to say that sounded like she had fluid on her lungs and having lots of swelling. She was just discharged from hospital with UTI.   Review of Systems     Objective:   Physical Exam  Constitutional: She appears distressed.  Cardiovascular: Normal rate.   Pulmonary/Chest: Wheezes: exp thrughout. She has rales (left lower lobe).  Neurological: She is alert.  Keeps falling asleep during appointment- but easily aroused  Skin: Skin is warm.    BP (!) 128/53   Pulse 64   Temp 97.4 F (36.3 C) (Oral)   Ht 5' (1.524 m)   Wt 140 lb (63.5 kg)   LMP 02/04/1963   BMI 27.34 kg/m   Chest x ray- possible left lowe rlobe infltrate with CHF-Preliminary reading by Ronnald Collum, FNP  [facility]      Assessment & Plan:  1. Edema, unspecified type Increased lasix from '20mg'$  BID to '40mg'$  BID- take '40mg'$  when get home today then another 20 mg this evening then stating tomorrow '40mg'$  BOD Follow up tomorrow Bring urine specimen to office tomorrow if cna get one. - DG Chest 2 View; Future  Orders Placed This Encounter  Procedures  . DG Chest 2 View    Standing Status:   Future    Number of Occurrences:   1    Standing Expiration Date:   03/12/2018    Order Specific Question:   Reason for Exam (SYMPTOM  OR DIAGNOSIS REQUIRED)    Answer:   sob    Order Specific Question:   Preferred imaging location?    Answer:   Internal  . CMP14+EGFR  . Brain natriuretic peptide  . CBC with Differential/Platelet   Mary-Margaret Hassell Done, FNP

## 2017-01-11 ENCOUNTER — Ambulatory Visit (INDEPENDENT_AMBULATORY_CARE_PROVIDER_SITE_OTHER): Payer: Medicare Other | Admitting: Nurse Practitioner

## 2017-01-11 ENCOUNTER — Other Ambulatory Visit: Payer: Self-pay | Admitting: Nurse Practitioner

## 2017-01-11 ENCOUNTER — Encounter: Payer: Self-pay | Admitting: Nurse Practitioner

## 2017-01-11 VITALS — BP 130/58 | HR 67 | Temp 97.2°F | Ht 60.0 in | Wt 139.0 lb

## 2017-01-11 DIAGNOSIS — I5032 Chronic diastolic (congestive) heart failure: Secondary | ICD-10-CM | POA: Diagnosis not present

## 2017-01-11 DIAGNOSIS — N390 Urinary tract infection, site not specified: Secondary | ICD-10-CM | POA: Diagnosis not present

## 2017-01-11 DIAGNOSIS — N184 Chronic kidney disease, stage 4 (severe): Secondary | ICD-10-CM | POA: Diagnosis not present

## 2017-01-11 DIAGNOSIS — D631 Anemia in chronic kidney disease: Secondary | ICD-10-CM | POA: Diagnosis not present

## 2017-01-11 LAB — CBC WITH DIFFERENTIAL/PLATELET
BASOS ABS: 0 10*3/uL (ref 0.0–0.2)
BASOS: 0 %
EOS (ABSOLUTE): 0.1 10*3/uL (ref 0.0–0.4)
Eos: 1 %
Hematocrit: 23.7 % — ABNORMAL LOW (ref 34.0–46.6)
Hemoglobin: 7.7 g/dL — CL (ref 11.1–15.9)
IMMATURE GRANS (ABS): 0 10*3/uL (ref 0.0–0.1)
IMMATURE GRANULOCYTES: 1 %
LYMPHS: 36 %
Lymphocytes Absolute: 2.1 10*3/uL (ref 0.7–3.1)
MCH: 29.8 pg (ref 26.6–33.0)
MCHC: 32.5 g/dL (ref 31.5–35.7)
MCV: 92 fL (ref 79–97)
Monocytes Absolute: 0.3 10*3/uL (ref 0.1–0.9)
Monocytes: 5 %
NEUTROS PCT: 57 %
Neutrophils Absolute: 3.3 10*3/uL (ref 1.4–7.0)
PLATELETS: 209 10*3/uL (ref 150–379)
RBC: 2.58 x10E6/uL — AB (ref 3.77–5.28)
RDW: 15 % (ref 12.3–15.4)
WBC: 5.7 10*3/uL (ref 3.4–10.8)

## 2017-01-11 LAB — CMP14+EGFR
ALT: 14 IU/L (ref 0–32)
AST: 17 IU/L (ref 0–40)
Albumin/Globulin Ratio: 1.3 (ref 1.2–2.2)
Albumin: 2.9 g/dL — ABNORMAL LOW (ref 3.5–4.7)
Alkaline Phosphatase: 33 IU/L — ABNORMAL LOW (ref 39–117)
BILIRUBIN TOTAL: 0.2 mg/dL (ref 0.0–1.2)
BUN/Creatinine Ratio: 9 — ABNORMAL LOW (ref 12–28)
BUN: 12 mg/dL (ref 8–27)
CALCIUM: 8.4 mg/dL — AB (ref 8.7–10.3)
CHLORIDE: 104 mmol/L (ref 96–106)
CO2: 25 mmol/L (ref 18–29)
Creatinine, Ser: 1.41 mg/dL — ABNORMAL HIGH (ref 0.57–1.00)
GFR calc non Af Amer: 34 mL/min/{1.73_m2} — ABNORMAL LOW (ref >59)
GFR, EST AFRICAN AMERICAN: 39 mL/min/{1.73_m2} — AB (ref >59)
GLUCOSE: 182 mg/dL — AB (ref 65–99)
Globulin, Total: 2.3 g/dL (ref 1.5–4.5)
Potassium: 4.1 mmol/L (ref 3.5–5.2)
Sodium: 143 mmol/L (ref 134–144)
TOTAL PROTEIN: 5.2 g/dL — AB (ref 6.0–8.5)

## 2017-01-11 LAB — URINALYSIS, COMPLETE
Bilirubin, UA: NEGATIVE
GLUCOSE, UA: NEGATIVE
KETONES UA: NEGATIVE
NITRITE UA: NEGATIVE
PROTEIN UA: NEGATIVE
SPEC GRAV UA: 1.01 (ref 1.005–1.030)
UUROB: 0.2 mg/dL (ref 0.2–1.0)
pH, UA: 5 (ref 5.0–7.5)

## 2017-01-11 LAB — MICROSCOPIC EXAMINATION: Renal Epithel, UA: NONE SEEN /hpf

## 2017-01-11 LAB — BRAIN NATRIURETIC PEPTIDE: BNP: 276.3 pg/mL — ABNORMAL HIGH (ref 0.0–100.0)

## 2017-01-11 MED ORDER — HEMOCYTE PLUS 106-1 MG PO CAPS: 1.0000 | ORAL_CAPSULE | Freq: Every day | ORAL | 5 refills | Status: DC

## 2017-01-11 NOTE — Progress Notes (Signed)
   Subjective:    Patient ID: Jamie Cochran, female    DOB: [birth year], 83 y.o.   MRN: [id number]  HPI Patient comes in today for recheck. She was seen yesterday for swelling and UTI- Was discharged from hospital last week. SHe was very wet sounding and was very lethargic. We sent her home with increase in lasix  And was asked to bring urine specimen in today. We did lab work and her hgb came back as 7.7- in hospital on 01/05/17 it was 8.9. Protein was low also. Granddaughter said she voided a lot yesterday. Was able to get shoes on her feet today which she could  Not do yesterday because of swelling.     Review of Systems  Constitutional: Positive for fatigue.  HENT: Negative.   Respiratory: Negative.   Cardiovascular: Negative.   Genitourinary: Negative.   Neurological: Negative.   Psychiatric/Behavioral: Negative.   All other systems reviewed and are negative.      Objective:   Physical Exam  Constitutional: She appears well-developed and well-nourished. No distress.  Cardiovascular: Normal rate.   Pulmonary/Chest: She has rales (fine crackles left lower lobe slightly diminished on right).  Musculoskeletal: She exhibits edema (1+ bil lower ext).  Skin: Skin is warm.  Psychiatric: She has a normal mood and affect. Her behavior is normal. Judgment and thought content normal.   BP (!) 130/58   Pulse 67   Temp 97.2 F (36.2 C) (Oral)   Ht 5' (1.524 m)   Wt 139 lb (63 kg)   LMP 02/04/1963   BMI 27.15 kg/m   Urine - 1+ leuks and traceof blood      Assessment & Plan:  1. Frequent UTI - Urinalysis, Complete  2. Chronic diastolic CHF (congestive heart failure) (HCC) Continue lasix 40 mg BID Daily weights  3. Anemia in stage 4 chronic kidney disease (HCC) Recheck hgb on Tuesday - Fe Fum-FA-B Cmp-C-Zn-Mg-Mn-Cu (HEMOCYTE PLUS) 106-1 MG CAPS; Take 1 tablet by mouth daily.  Dispense: 30 each; Refill: 5  Mary-Margaret Daphine DeutscherMartin, FNP

## 2017-01-16 ENCOUNTER — Encounter: Payer: Self-pay | Admitting: Nurse Practitioner

## 2017-01-16 ENCOUNTER — Ambulatory Visit (INDEPENDENT_AMBULATORY_CARE_PROVIDER_SITE_OTHER): Payer: Medicare Other | Admitting: Nurse Practitioner

## 2017-01-16 VITALS — BP 137/67 | HR 56 | Temp 96.8°F | Ht 60.0 in | Wt 130.0 lb

## 2017-01-16 DIAGNOSIS — D631 Anemia in chronic kidney disease: Secondary | ICD-10-CM | POA: Diagnosis not present

## 2017-01-16 DIAGNOSIS — I5032 Chronic diastolic (congestive) heart failure: Secondary | ICD-10-CM | POA: Diagnosis not present

## 2017-01-16 DIAGNOSIS — N184 Chronic kidney disease, stage 4 (severe): Secondary | ICD-10-CM | POA: Diagnosis not present

## 2017-01-16 LAB — BMP8+EGFR
BUN / CREAT RATIO: 20 (ref 12–28)
BUN: 33 mg/dL — AB (ref 8–27)
CALCIUM: 10 mg/dL (ref 8.7–10.3)
CHLORIDE: 93 mmol/L — AB (ref 96–106)
CO2: 33 mmol/L — ABNORMAL HIGH (ref 18–29)
Creatinine, Ser: 1.66 mg/dL — ABNORMAL HIGH (ref 0.57–1.00)
GFR calc non Af Amer: 28 mL/min/{1.73_m2} — ABNORMAL LOW (ref >59)
GFR, EST AFRICAN AMERICAN: 32 mL/min/{1.73_m2} — AB (ref >59)
GLUCOSE: 218 mg/dL — AB (ref 65–99)
POTASSIUM: 3.4 mmol/L — AB (ref 3.5–5.2)
Sodium: 142 mmol/L (ref 134–144)

## 2017-01-16 LAB — FINGERSTICK HEMOGLOBIN: HEMOGLOBIN: 7.6 g/dL — AB (ref 11.1–15.9)

## 2017-01-16 NOTE — Patient Instructions (Signed)
Anemia, Nonspecific Anemia is a condition in which the concentration of red blood cells or hemoglobin in the blood is below normal. Hemoglobin is a substance in red blood cells that carries oxygen to the tissues of the body. Anemia results in not enough oxygen reaching these tissues. What are the causes? Common causes of anemia include:  Excessive bleeding. Bleeding may be internal or external. This includes excessive bleeding from periods (in women) or from the intestine.  Poor nutrition.  Chronic kidney, thyroid, and liver disease.  Bone marrow disorders that decrease red blood cell production.  Cancer and treatments for cancer.  HIV, AIDS, and their treatments.  Spleen problems that increase red blood cell destruction.  Blood disorders.  Excess destruction of red blood cells due to infection, medicines, and autoimmune disorders. What are the signs or symptoms?  Minor weakness.  Dizziness.  Headache.  Palpitations.  Shortness of breath, especially with exercise.  Paleness.  Cold sensitivity.  Indigestion.  Nausea.  Difficulty sleeping.  Difficulty concentrating. Symptoms may occur suddenly or they may develop slowly. How is this diagnosed? Additional blood tests are often needed. These help your health care provider determine the best treatment. Your health care provider will check your stool for blood and look for other causes of blood loss. How is this treated? Treatment varies depending on the cause of the anemia. Treatment can include:  Supplements of iron, vitamin B12, or folic acid.  Hormone medicines.  A blood transfusion. This may be needed if blood loss is severe.  Hospitalization. This may be needed if there is significant continual blood loss.  Dietary changes.  Spleen removal. Follow these instructions at home: Keep all follow-up appointments. It often takes many weeks to correct anemia, and having your health care provider check on your  condition and your response to treatment is very important. Get help right away if:  You develop extreme weakness, shortness of breath, or chest pain.  You become dizzy or have trouble concentrating.  You develop heavy vaginal bleeding.  You develop a rash.  You have bloody or black, tarry stools.  You faint.  You vomit up blood.  You vomit repeatedly.  You have abdominal pain.  You have a fever or persistent symptoms for more than 2-3 days.  You have a fever and your symptoms suddenly get worse.  You are dehydrated. This information is not intended to replace advice given to you by your health care provider. Make sure you discuss any questions you have with your health care provider. Document Released: 12/07/2004 Document Revised: 04/12/2016 Document Reviewed: 04/25/2013 Elsevier Interactive Patient Education  2017 Elsevier Inc.  

## 2017-01-16 NOTE — Progress Notes (Signed)
   Subjective:    Patient ID: Jamie Cochran, female    DOB: 03-11-1930, 83 y.o.   MRN: [id number]  HPI Patient comes back today for follow up- she was seen on 01/09/17 and was very lethargic and SOB- DX with CHF- was put back on lasix and followed up on 01/10/17 and was much better - more alert and lungs sounded much better. Her HGB was low as well and she was put on iron supplement. Here today to repeat labs. Granddaughter says she seems bettter but still wants to sleep a lot- lesf confused then she was last week.    Review of Systems  Constitutional: Positive for fatigue. Negative for chills and fever.  HENT: Negative.   Respiratory: Negative.   Cardiovascular: Negative.   Genitourinary: Negative.   Neurological: Negative.   Psychiatric/Behavioral: Negative.        Objective:   Physical Exam  Constitutional: She is oriented to person, place, and time. She appears well-developed and well-nourished. No distress.  Cardiovascular: Normal rate.   Pulmonary/Chest: Effort normal and breath sounds normal.  Neurological: She is alert and oriented to person, place, and time. No cranial nerve deficit.  Falling asleep easily  Skin: Skin is warm.  Psychiatric: She has a normal mood and affect. Her behavior is normal. Judgment and thought content normal.   BP 137/67   Pulse (!) 56   Temp (!) 96.8 F (36 C) (Oral)   Ht 5' (1.524 m)   Wt 130 lb (59 kg)   LMP 02/04/1963   BMI 25.39 kg/m   HGB 7.6%       Assessment & Plan:  1. Chronic diastolic CHF (congestive heart failure) (HCC) Labs pending Continue lasix as rx - Brain natriuretic peptide - BMP8+EGFR  2. Anemia in stage 4 chronic kidney disease (HCC) Continue iron supplements  - Fingerstick Hemoglobin - Ambulatory referral to Hematology  Mary-Margaret Hassell Done, FNP

## 2017-01-17 LAB — BRAIN NATRIURETIC PEPTIDE: BNP: 114.5 pg/mL — ABNORMAL HIGH (ref 0.0–100.0)

## 2017-01-29 ENCOUNTER — Encounter (HOSPITAL_COMMUNITY): Payer: Medicare Other | Attending: Hematology | Admitting: Hematology

## 2017-01-29 ENCOUNTER — Encounter (HOSPITAL_COMMUNITY): Payer: Self-pay

## 2017-01-29 ENCOUNTER — Encounter (HOSPITAL_COMMUNITY): Payer: Medicare Other

## 2017-01-29 VITALS — BP 150/53 | HR 60 | Temp 97.9°F | Resp 20 | Ht <= 58 in | Wt 125.6 lb

## 2017-01-29 DIAGNOSIS — E1122 Type 2 diabetes mellitus with diabetic chronic kidney disease: Secondary | ICD-10-CM | POA: Diagnosis not present

## 2017-01-29 DIAGNOSIS — Z7982 Long term (current) use of aspirin: Secondary | ICD-10-CM | POA: Diagnosis not present

## 2017-01-29 DIAGNOSIS — I509 Heart failure, unspecified: Secondary | ICD-10-CM | POA: Diagnosis not present

## 2017-01-29 DIAGNOSIS — D649 Anemia, unspecified: Secondary | ICD-10-CM | POA: Insufficient documentation

## 2017-01-29 DIAGNOSIS — Z794 Long term (current) use of insulin: Secondary | ICD-10-CM | POA: Insufficient documentation

## 2017-01-29 DIAGNOSIS — I10 Essential (primary) hypertension: Secondary | ICD-10-CM

## 2017-01-29 DIAGNOSIS — Z8673 Personal history of transient ischemic attack (TIA), and cerebral infarction without residual deficits: Secondary | ICD-10-CM

## 2017-01-29 DIAGNOSIS — Z882 Allergy status to sulfonamides status: Secondary | ICD-10-CM | POA: Diagnosis not present

## 2017-01-29 DIAGNOSIS — F0390 Unspecified dementia without behavioral disturbance: Secondary | ICD-10-CM | POA: Diagnosis not present

## 2017-01-29 DIAGNOSIS — N189 Chronic kidney disease, unspecified: Secondary | ICD-10-CM | POA: Insufficient documentation

## 2017-01-29 DIAGNOSIS — Z8249 Family history of ischemic heart disease and other diseases of the circulatory system: Secondary | ICD-10-CM | POA: Insufficient documentation

## 2017-01-29 DIAGNOSIS — Z79899 Other long term (current) drug therapy: Secondary | ICD-10-CM | POA: Insufficient documentation

## 2017-01-29 DIAGNOSIS — I130 Hypertensive heart and chronic kidney disease with heart failure and stage 1 through stage 4 chronic kidney disease, or unspecified chronic kidney disease: Secondary | ICD-10-CM | POA: Insufficient documentation

## 2017-01-29 LAB — IRON AND TIBC
IRON: 68 ug/dL (ref 28–170)
SATURATION RATIOS: 13 % (ref 10.4–31.8)
TIBC: 532 ug/dL — AB (ref 250–450)
UIBC: 464 ug/dL

## 2017-01-29 LAB — RETICULOCYTES
RBC.: 3.44 MIL/uL — AB (ref 3.87–5.11)
RETIC COUNT ABSOLUTE: 161.7 10*3/uL (ref 19.0–186.0)
Retic Ct Pct: 4.7 % — ABNORMAL HIGH (ref 0.4–3.1)

## 2017-01-29 LAB — CBC WITH DIFFERENTIAL/PLATELET
BASOS ABS: 0 10*3/uL (ref 0.0–0.1)
BASOS PCT: 0 %
Eosinophils Absolute: 0.2 10*3/uL (ref 0.0–0.7)
Eosinophils Relative: 3 %
HEMATOCRIT: 32.9 % — AB (ref 36.0–46.0)
Hemoglobin: 10.9 g/dL — ABNORMAL LOW (ref 12.0–15.0)
Lymphocytes Relative: 38 %
Lymphs Abs: 3 10*3/uL (ref 0.7–4.0)
MCH: 31.7 pg (ref 26.0–34.0)
MCHC: 33.1 g/dL (ref 30.0–36.0)
MCV: 95.6 fL (ref 78.0–100.0)
MONOS PCT: 8 %
Monocytes Absolute: 0.6 10*3/uL (ref 0.1–1.0)
NEUTROS ABS: 4 10*3/uL (ref 1.7–7.7)
Neutrophils Relative %: 51 %
PLATELETS: 158 10*3/uL (ref 150–400)
RBC: 3.44 MIL/uL — ABNORMAL LOW (ref 3.87–5.11)
RDW: 16.1 % — AB (ref 11.5–15.5)
WBC: 7.9 10*3/uL (ref 4.0–10.5)

## 2017-01-29 LAB — COMPREHENSIVE METABOLIC PANEL
ALT: 9 U/L — AB (ref 14–54)
AST: 18 U/L (ref 15–41)
Albumin: 3.6 g/dL (ref 3.5–5.0)
Alkaline Phosphatase: 37 U/L — ABNORMAL LOW (ref 38–126)
Anion gap: 6 (ref 5–15)
BILIRUBIN TOTAL: 0.4 mg/dL (ref 0.3–1.2)
BUN: 38 mg/dL — ABNORMAL HIGH (ref 6–20)
CALCIUM: 9.3 mg/dL (ref 8.9–10.3)
CHLORIDE: 102 mmol/L (ref 101–111)
CO2: 31 mmol/L (ref 22–32)
Creatinine, Ser: 1.99 mg/dL — ABNORMAL HIGH (ref 0.44–1.00)
GFR, EST AFRICAN AMERICAN: 25 mL/min — AB (ref >60)
GFR, EST NON AFRICAN AMERICAN: 22 mL/min — AB (ref >60)
GLUCOSE: 123 mg/dL — AB (ref 65–99)
POTASSIUM: 3.6 mmol/L (ref 3.5–5.1)
Sodium: 139 mmol/L (ref 135–145)
Total Protein: 7 g/dL (ref 6.5–8.1)

## 2017-01-29 LAB — TYPE AND SCREEN
ABO/RH(D): O POS
Antibody Screen: NEGATIVE

## 2017-01-29 LAB — VITAMIN B12: Vitamin B-12: 277 pg/mL (ref 180–914)

## 2017-01-29 LAB — FERRITIN: Ferritin: 35 ng/mL (ref 11–307)

## 2017-01-29 LAB — LACTATE DEHYDROGENASE: LDH: 158 U/L (ref 98–192)

## 2017-01-29 NOTE — Progress Notes (Signed)
Marland Kitchen    HEMATOLOGY/ONCOLOGY CONSULTATION NOTE  Date of Service: 01/29/2017  Patient Care Team: Chevis Pretty, FNP as PCP - General (Nurse Practitioner)  CHIEF COMPLAINTS/PURPOSE OF CONSULTATION:  Anemia  HISTORY OF PRESENTING ILLNESS:   Jamie Cochran is a wonderful 83 y.o. female who has been referred to [place] by Dr Chevis Pretty, FNP  for evaluation and management of normocytic anemia.  Patient is very hard of hearing and is accompanied by her granddaughter who is her primary caregiver and helps answered all of her questions.  She has a history of significant dementia, urinary incontinence and multiple other medical comorbidities including hypertension, dysphagia, hypothyroidism, CVA, congestive heart failure, chronic kidney disease.  Patient was recently in the hospital for Escherichia coli sepsis that has set her back significantly with regards to her performance status. She had a hemoglobin of 12.6 on 01/01/2017 an MCV of 93.3. Her hemoglobin progressively dropped during hospitalization down to 7.7 with an MCV of 92. She was thrombocytopenic likely due to her infection with platelets down to the 50,000 range but improved to 209k upon discharge.  Patient did not receive any PRBC transfusions. He was seen by her primary care physician in clinic and was noted to have a hemoglobin of 7.6 on 01/16/2017 and was referred to [place] for further evaluation and management.  Granddaughter notes the patient is very fatigued and sleepy and sleeps most of the day. No overt GI bleeding reported. Weight is at 125 pounds which the daughter notes he is her good wt. Was apparently quite fluid overloaded previously.   MEDICAL HISTORY:  Past Medical History:  Diagnosis Date  . CHF (congestive heart failure) ([place])   . Dementia   . Diabetes type 2, uncontrolled ([place])   . Hearing loss    history of right sided cholesteotoma s/p Right tympanoplasty with ossicular reconstruction. (03/2002)  .  Hyperlipidemia   . Hypertension   . Hypothyroidism   . Non-occlusive coronary artery disease    40% first diagonal stenosis, 50% obtuse marginal stenosis  . Osteoporosis   . Stroke ([place]) in 05/2012, ?   deep right frontal perventricular deep white matter infarct + subacute left thalamic lacunar infarct  . Urinary incontinence    s/p surgery for ureterocele  . UTI (lower urinary tract infection) 09/2015   granddaughter reports "constant UTIs"    SURGICAL HISTORY: Past Surgical History:  Procedure Laterality Date  . ABDOMINAL HYSTERECTOMY    . BLADDER REPAIR    . CESAREAN SECTION    . EYE SURGERY    . INNER EAR SURGERY    . TONSILLECTOMY AND ADENOIDECTOMY      SOCIAL HISTORY: Social History   Social History  . Marital status: Widowed    Spouse name: N/A  . Number of children: N/A  . Years of education: N/A   Occupational History  . retired    Social History Main Topics  . Smoking status: Never Smoker  . Smokeless tobacco: Never Used  . Alcohol use No  . Drug use: No  . Sexual activity: Not on file   Other Topics Concern  . Not on file   Social History Narrative   Lives at home alone in [place], [place]   //       [place] with ADLS:   Bathing: Yes   Dressing: Yes   Toileting: Yes   Maintaining continence: No   Grooming: Yes   Feeding: Yes   Transferring: Yes         //  [place] with IADLS:    Shopping for groceries: Yes    Driving or using public transportation: N/A   Using the telephone: Yes   Performing housework: Yes   Doing home repair: Yes   Doing laundry:  Yes   Taking medications: Yes   Handling finances: Yes                   FAMILY HISTORY: Family History  Problem Relation Age of Onset  . Heart disease Father     ALLERGIES:  is allergic to sulfamethoxazole and sulfa antibiotics.  MEDICATIONS:  Current Outpatient Prescriptions  Medication Sig Dispense Refill  . amLODipine (NORVASC) 5 MG tablet TAKE ONE TABLET BY MOUTH  ONCE DAILY MUST  BE  SEEN  FOR  ADDITIONAL  REFILLS (Patient taking differently: Take 5 mg by mouth daily. TAKE ONE TABLET BY MOUTH ONCE DAILY MUST  BE  SEEN  FOR  ADDITIONAL  REFILLS) 30 tablet 5  . aspirin 81 MG tablet Take 81 mg by mouth daily.    . carvedilol (COREG) 25 MG tablet TAKE ONE TABLET BY MOUTH TWICE DAILY WITH MEALS MUST  BE  SEEN  FOR  ADDITIONAL  REFILLS (Patient taking differently: Take 25 mg by mouth 2 (two) times daily with a meal. ) 60 tablet 5  . citalopram (CELEXA) 20 MG tablet TAKE ONE TABLET BY MOUTH ONCE DAILY MUST  BE  SEEN  FOR  ADDITIONAL  REFILLS (Patient taking differently: Take 20 mg by mouth daily. ) 30 tablet 5  . Fe Fum-FA-B Cmp-C-Zn-Mg-Mn-Cu (HEMOCYTE PLUS) 106-1 MG CAPS Take 1 tablet by mouth daily. 30 each 5  . fenofibrate (TRICOR) 145 MG tablet Take 1 tablet (145 mg total) by mouth daily. 30 tablet 5  . furosemide (LASIX) 40 MG tablet Take 1 tablet (40 mg total) by mouth daily. 60 tablet 3  . guaiFENesin (MUCINEX) 600 MG 12 hr tablet Take 1 tablet (600 mg total) by mouth 2 (two) times daily. 30 tablet 0  . insulin NPH Human (NOVOLIN N RELION) 100 UNIT/ML injection Inject 0.1 mLs (10 Units total) into the skin daily before breakfast. 10 mL 2  . levothyroxine (SYNTHROID, LEVOTHROID) 75 MCG tablet Take 1 tablet (75 mcg total) by mouth daily. 30 tablet 5  . loperamide (IMODIUM A-D) 2 MG tablet Take 1 tablet (2 mg total) by mouth 4 (four) times daily as needed for diarrhea or loose stools. 30 tablet 0  . memantine (NAMENDA) 10 MG tablet Take 1 tablet (10 mg total) by mouth 2 (two) times daily. 30 tablet 5  . omeprazole (PRILOSEC) 40 MG capsule Take 1 capsule (40 mg total) by mouth daily. 30 capsule 5  . ONETOUCH VERIO test strip USE  STRIP TO CHECK GLUCOSE THREE TIMES DAILY 100 each 5   No current facility-administered medications for this visit.     REVIEW OF SYSTEMS:    10 Point review of Systems was done is negative except as noted above.  PHYSICAL  EXAMINATION: ECOG PERFORMANCE STATUS: 3 - Symptomatic, >50% confined to bed  . Vitals:   01/29/17 0839  BP: (!) 150/53  Pulse: 60  Resp: 20  Temp: 97.9 F (36.6 C)   Filed Weights   01/29/17 0839  Weight: 125 lb 9.6 oz (57 kg)   .Body mass index is 28.16 kg/m.  GENERAL:alert, elderly female in NAD, fatigue apeparing very HOH SKIN: no acute rashes, no significant lesions EYES: conjunctiva are pink and non-injected, sclera anicteric OROPHARYNX: MMM, no  exudates, no oropharyngeal erythema or ulceration NECK: supple, mild JVD LYMPH:  no palpable lymphadenopathy in the cervical, axillary or inguinal regions LUNGS: b/l minimal basal rales no rhonci HEART: regular rate & rhythm ABDOMEN:  normoactive bowel sounds , non tender, not distended. Extremity: no pedal edema PSYCH: alert & oriented x 3 with fluent speech NEURO: no focal motor/sensory deficits  LABORATORY DATA:  I have reviewed the data as listed  . CBC Latest Ref Rng & Units 01/10/2017 01/05/2017 01/04/2017  WBC 3.4 - 10.8 x10E3/uL 5.7 5.4 5.1  Hemoglobin 12.0 - 15.0 g/dL - 8.9(L) 9.0(L)  Hematocrit 34.0 - 46.6 % 23.7(L) 26.8(L) 26.7(L)  Platelets 150 - 379 x10E3/uL 209 53(L) 54(L)    . CMP Latest Ref Rng & Units 01/16/2017 01/10/2017 01/06/2017  Glucose 65 - 99 mg/dL 218(H) 182(H) 134(H)  BUN 8 - 27 mg/dL 33(H) 12 19  Creatinine 0.57 - 1.00 mg/dL 1.66(H) 1.41(H) 1.30(H)  Sodium 134 - 144 mmol/L 142 143 139  Potassium 3.5 - 5.2 mmol/L 3.4(L) 4.1 4.4  Chloride 96 - 106 mmol/L 93(L) 104 113(H)  CO2 18 - 29 mmol/L 33(H) 25 21(L)  Calcium 8.7 - 10.3 mg/dL 10.0 8.4(L) 7.5(L)  Total Protein 6.0 - 8.5 g/dL - 5.2(L) -  Total Bilirubin 0.0 - 1.2 mg/dL - 0.2 -  Alkaline Phos 39 - 117 IU/L - 33(L) -  AST 0 - 40 IU/L - 17 -  ALT 0 - 32 IU/L - 14 -     RADIOGRAPHIC STUDIES: I have personally reviewed the radiological images as listed and agreed with the findings in the report.  ASSESSMENT & PLAN:   83 yo female with  multiple medical co-morbidities with   1) Acute on chronic Anemia with recent hospitalization r/o GI bleeding Partly from severe sepsis - also had thrombocytopenia which has since resolved. Cannot r/o element of MDS given her age  34) Borderline elevated LDH in the hospital - could have been from sepsis. PLAN  - labs for anemia w/u ordered as noted below . Orders Placed This Encounter  Procedures  . CBC with Differential/Platelet    Standing Status:   Future    Number of Occurrences:   1    Standing Expiration Date:   01/29/2018  . Reticulocytes    Standing Status:   Future    Number of Occurrences:   1    Standing Expiration Date:   01/29/2018  . Comprehensive metabolic panel    Standing Status:   Future    Number of Occurrences:   1    Standing Expiration Date:   01/29/2018  . Lactate dehydrogenase    Standing Status:   Future    Number of Occurrences:   1    Standing Expiration Date:   01/29/2018  . Ferritin    Standing Status:   Future    Number of Occurrences:   1    Standing Expiration Date:   01/29/2018  . Iron and TIBC    Standing Status:   Future    Number of Occurrences:   1    Standing Expiration Date:   01/29/2018  . Vitamin B12    Standing Status:   Future    Number of Occurrences:   1    Standing Expiration Date:   01/29/2018  . Multiple Myeloma Panel (SPEP&IFE w/QIG)    Standing Status:   Future    Number of Occurrences:   1    Standing Expiration Date:   01/29/2018  .  Kappa/lambda light chains    Standing Status:   Future    Number of Occurrences:   1    Standing Expiration Date:   01/29/2018  . Folate RBC    Standing Status:   Future    Number of Occurrences:   1    Standing Expiration Date:   01/29/2018  . CBC with Differential/Platelet    Standing Status:   Future    Standing Expiration Date:   01/29/2018  . Haptoglobin    Standing Status:   Future    Standing Expiration Date:   01/29/2018  . Type and screen    Standing Status:   Future    Number of  Occurrences:   1    Standing Expiration Date:   01/29/2018   -will transfuse prn to maintain hgb >8 -IV iron if iron deficient with goal to keep ferritin >100 in the setting of CKD -consider EPO if ferritin >100 and still getting anemic -not a good candidate for aggressive w/u with bone marrow examination given severe life limiting medical co-morbidities -f/u with PCP for FOBT and defining goals of care. High risk for recurrent hospitalization for CHF, aspiration, falls, recurrent UTI's, dementia related issues etc. -has HHS and family care givers at home  RTC in 3 weeks with labs  All of the patients questions were answered with apparent satisfaction. The patient knows to call the clinic with any problems, questions or concerns.  I spent 45 minutes counseling the patient face to face. The total time spent in the appointment was 60 minutes and more than 50% was on counseling and direct patient cares.    Jamie Lone MD [place] AAHIVMS [facility] Hematology/Oncology Physician [facility]  (Office):       [phone] (Work cell):  [phone] (Fax):           [phone]  01/29/2017 8:42 AM

## 2017-01-29 NOTE — Patient Instructions (Signed)
[facility] at [facility] Discharge Instructions  RECOMMENDATIONS MADE BY THE CONSULTANT AND ANY TEST RESULTS WILL BE SENT TO YOUR REFERRING PHYSICIAN.  You were seen today by Dr. Ralene CorkLouise Zhou Please bring all your medications with you for your next visit Lab work today Follow up in 3 weeks See Amy up front for appointments   Thank you for choosing [facility] at [facility] to provide your oncology and hematology care.  To afford each patient quality time with our provider, please arrive at least 15 minutes before your scheduled appointment time.    If you have a lab appointment with the Cancer Center please come in thru the  Main Entrance and check in at the main information desk  You need to re-schedule your appointment should you arrive 10 or more minutes late.  We strive to give you quality time with our providers, and arriving late affects you and other patients whose appointments are after yours.  Also, if you no show three or more times for appointments you may be dismissed from the clinic at the providers discretion.     Again, thank you for choosing [facility].  Our hope is that these requests will decrease the amount of time that you wait before being seen by our physicians.       _____________________________________________________________  Should you have questions after your visit to [facility], please contact our office at [phone] between the hours of 8:30 a.m. and 4:30 p.m.  Voicemails left after 4:30 p.m. will not be returned until the following business day.  For prescription refill requests, have your pharmacy contact our office.       Resources For Cancer Patients and their Caregivers ? American Cancer Society: Can assist with transportation, wigs, general needs, runs Look Good Feel Better.        65055513481-223-102-0463 ? Cancer Care: Provides financial assistance, online support groups,  medication/co-pay assistance.  1-800-813-HOPE 2537440483(4673) ? [facility] Assists [place] cancer patients and their families through emotional , educational and financial support.  ([phone] 643-7687336-321-9815 ? [place] DSS Where to apply for food stamps, Medicaid and utility assistance. (618)591-4116289-114-2872 ? RCATS: Transportation to medical appointments. 5644383041806-040-0804 ? Social Security Administration: May apply for disability if have a Stage IV cancer. 918-850-3964(640)140-3764 ([phone] ? [place] Aging, Disability and Transit Services: Assists with nutrition, care and transit needs. ([phone]  Cancer Center Support Programs: @10RELATIVEDAYS @ > Cancer Support Group  2nd Tuesday of the month 1pm-2pm, Journey Room  > Creative Journey  3rd Tuesday of the month 1130am-1pm, Journey Room  > Look Good Feel Better  1st Wednesday of the month 10am-12 noon, Journey Room (Call American Cancer Society to register [phone])

## 2017-01-30 LAB — KAPPA/LAMBDA LIGHT CHAINS
KAPPA, LAMDA LIGHT CHAIN RATIO: 3.19 — AB (ref 0.26–1.65)
Kappa free light chain: 95.7 mg/L — ABNORMAL HIGH (ref 3.3–19.4)
Lambda free light chains: 30 mg/L — ABNORMAL HIGH (ref 5.7–26.3)

## 2017-01-30 LAB — FOLATE RBC
FOLATE, RBC: 1286 ng/mL (ref >498)
Folate, Hemolysate: 416.8 ng/mL
HEMATOCRIT: 32.4 % — AB (ref 34.0–46.6)

## 2017-01-31 LAB — MULTIPLE MYELOMA PANEL, SERUM
Albumin SerPl Elph-Mcnc: 3.3 g/dL (ref 2.9–4.4)
Albumin/Glob SerPl: 1 (ref 0.7–1.7)
Alpha 1: 0.3 g/dL (ref 0.0–0.4)
Alpha2 Glob SerPl Elph-Mcnc: 0.6 g/dL (ref 0.4–1.0)
B-Globulin SerPl Elph-Mcnc: 1.1 g/dL (ref 0.7–1.3)
Gamma Glob SerPl Elph-Mcnc: 1.3 g/dL (ref 0.4–1.8)
Globulin, Total: 3.4 g/dL (ref 2.2–3.9)
IGG (IMMUNOGLOBIN G), SERUM: 1308 mg/dL (ref 700–1600)
IGM, SERUM: 40 mg/dL (ref 26–217)
IgA: 209 mg/dL (ref 64–422)
TOTAL PROTEIN ELP: 6.7 g/dL (ref 6.0–8.5)

## 2017-02-05 ENCOUNTER — Other Ambulatory Visit (HOSPITAL_COMMUNITY): Payer: Self-pay | Admitting: Oncology

## 2017-02-05 ENCOUNTER — Encounter (HOSPITAL_COMMUNITY): Payer: Self-pay | Admitting: Oncology

## 2017-02-05 DIAGNOSIS — E538 Deficiency of other specified B group vitamins: Secondary | ICD-10-CM

## 2017-02-05 DIAGNOSIS — D509 Iron deficiency anemia, unspecified: Secondary | ICD-10-CM

## 2017-02-05 HISTORY — DX: Iron deficiency anemia, unspecified: D50.9

## 2017-02-05 MED ORDER — B-12 1000 MCG SL SUBL: 1000.0000 ug | SUBLINGUAL_TABLET | Freq: Every day | SUBLINGUAL | 11 refills | Status: AC

## 2017-02-15 ENCOUNTER — Encounter (HOSPITAL_COMMUNITY): Payer: Self-pay

## 2017-02-15 ENCOUNTER — Encounter (HOSPITAL_COMMUNITY): Payer: Medicare Other | Attending: Hematology

## 2017-02-15 VITALS — BP 175/56 | HR 54 | Temp 98.6°F | Resp 16

## 2017-02-15 DIAGNOSIS — N189 Chronic kidney disease, unspecified: Secondary | ICD-10-CM | POA: Insufficient documentation

## 2017-02-15 DIAGNOSIS — D509 Iron deficiency anemia, unspecified: Secondary | ICD-10-CM

## 2017-02-15 DIAGNOSIS — Z794 Long term (current) use of insulin: Secondary | ICD-10-CM | POA: Insufficient documentation

## 2017-02-15 DIAGNOSIS — Z882 Allergy status to sulfonamides status: Secondary | ICD-10-CM | POA: Insufficient documentation

## 2017-02-15 DIAGNOSIS — D649 Anemia, unspecified: Secondary | ICD-10-CM | POA: Diagnosis not present

## 2017-02-15 DIAGNOSIS — E1122 Type 2 diabetes mellitus with diabetic chronic kidney disease: Secondary | ICD-10-CM | POA: Insufficient documentation

## 2017-02-15 DIAGNOSIS — I509 Heart failure, unspecified: Secondary | ICD-10-CM | POA: Insufficient documentation

## 2017-02-15 DIAGNOSIS — Z79899 Other long term (current) drug therapy: Secondary | ICD-10-CM | POA: Insufficient documentation

## 2017-02-15 DIAGNOSIS — Z8249 Family history of ischemic heart disease and other diseases of the circulatory system: Secondary | ICD-10-CM | POA: Insufficient documentation

## 2017-02-15 DIAGNOSIS — I130 Hypertensive heart and chronic kidney disease with heart failure and stage 1 through stage 4 chronic kidney disease, or unspecified chronic kidney disease: Secondary | ICD-10-CM | POA: Insufficient documentation

## 2017-02-15 DIAGNOSIS — Z7982 Long term (current) use of aspirin: Secondary | ICD-10-CM | POA: Insufficient documentation

## 2017-02-15 MED ORDER — SODIUM CHLORIDE 0.9 % IV SOLN
510.0000 mg | Freq: Once | INTRAVENOUS | Status: AC
  Administered 2017-02-15: 510 mg via INTRAVENOUS
  Filled 2017-02-15: qty 17

## 2017-02-15 MED ORDER — SODIUM CHLORIDE 0.9 % IV SOLN
Freq: Once | INTRAVENOUS | Status: AC
  Administered 2017-02-15: 14:00:00 via INTRAVENOUS

## 2017-02-15 NOTE — Patient Instructions (Signed)
[facility] at [facility] Discharge Instructions  RECOMMENDATIONS MADE BY THE CONSULTANT AND ANY TEST RESULTS WILL BE SENT TO YOUR REFERRING PHYSICIAN.  Feraheme given today Follow up as scheduled.  Thank you for choosing [facility] at [facility] to provide your oncology and hematology care.  To afford each patient quality time with our provider, please arrive at least 15 minutes before your scheduled appointment time.    If you have a lab appointment with the Cancer Center please come in thru the  Main Entrance and check in at the main information desk  You need to re-schedule your appointment should you arrive 10 or more minutes late.  We strive to give you quality time with our providers, and arriving late affects you and other patients whose appointments are after yours.  Also, if you no show three or more times for appointments you may be dismissed from the clinic at the providers discretion.     Again, thank you for choosing [facility].  Our hope is that these requests will decrease the amount of time that you wait before being seen by our physicians.       _____________________________________________________________  Should you have questions after your visit to [facility], please contact our office at [phone] between the hours of 8:30 a.m. and 4:30 p.m.  Voicemails left after 4:30 p.m. will not be returned until the following business day.  For prescription refill requests, have your pharmacy contact our office.       Resources For Cancer Patients and their Caregivers ? American Cancer Society: Can assist with transportation, wigs, general needs, runs Look Good Feel Better.        1-888-227-6333 ? Cancer Care: Provides financial assistance, online support groups, medication/co-pay assistance.  1-800-813-HOPE (4673) ? [facility] Assists [place] cancer patients and  their families through emotional , educational and financial support.  336-427-4357 ? [place] DSS Where to apply for food stamps, Medicaid and utility assistance. 336-342-1394 ? RCATS: Transportation to medical appointments. 336-347-2287 ? Social Security Administration: May apply for disability if have a Stage IV cancer. 336-342-7796 1-800-772-1213 ? [place] Aging, Disability and Transit Services: Assists with nutrition, care and transit needs. 336-349-2343  Cancer Center Support Programs: @10RELATIVEDAYS@ > Cancer Support Group  2nd Tuesday of the month 1pm-2pm, Journey Room  > Creative Journey  3rd Tuesday of the month 1130am-1pm, Journey Room  > Look Good Feel Better  1st Wednesday of the month 10am-12 noon, Journey Room (Call American Cancer Society to register 1-800-395-5775)   

## 2017-02-15 NOTE — Progress Notes (Signed)
Feraheme given today, patient tolerated it well. No problems. Vitals stable and discharged home ambulatory. Follow up as scheduled.

## 2017-02-16 ENCOUNTER — Ambulatory Visit (INDEPENDENT_AMBULATORY_CARE_PROVIDER_SITE_OTHER): Payer: Medicare Other | Admitting: Nurse Practitioner

## 2017-02-16 ENCOUNTER — Encounter: Payer: Self-pay | Admitting: Nurse Practitioner

## 2017-02-16 VITALS — BP 138/64 | HR 53 | Temp 97.0°F | Ht <= 58 in | Wt 122.4 lb

## 2017-02-16 DIAGNOSIS — R1314 Dysphagia, pharyngoesophageal phase: Secondary | ICD-10-CM | POA: Diagnosis not present

## 2017-02-16 DIAGNOSIS — F3342 Major depressive disorder, recurrent, in full remission: Secondary | ICD-10-CM | POA: Diagnosis not present

## 2017-02-16 DIAGNOSIS — F0390 Unspecified dementia without behavioral disturbance: Secondary | ICD-10-CM | POA: Diagnosis not present

## 2017-02-16 DIAGNOSIS — E039 Hypothyroidism, unspecified: Secondary | ICD-10-CM

## 2017-02-16 DIAGNOSIS — I5032 Chronic diastolic (congestive) heart failure: Secondary | ICD-10-CM

## 2017-02-16 DIAGNOSIS — E119 Type 2 diabetes mellitus without complications: Secondary | ICD-10-CM

## 2017-02-16 DIAGNOSIS — A419 Sepsis, unspecified organism: Secondary | ICD-10-CM

## 2017-02-16 DIAGNOSIS — D509 Iron deficiency anemia, unspecified: Secondary | ICD-10-CM

## 2017-02-16 DIAGNOSIS — E782 Mixed hyperlipidemia: Secondary | ICD-10-CM | POA: Diagnosis not present

## 2017-02-16 DIAGNOSIS — N390 Urinary tract infection, site not specified: Secondary | ICD-10-CM

## 2017-02-16 DIAGNOSIS — I10 Essential (primary) hypertension: Secondary | ICD-10-CM

## 2017-02-16 DIAGNOSIS — E876 Hypokalemia: Secondary | ICD-10-CM | POA: Diagnosis not present

## 2017-02-16 DIAGNOSIS — Z794 Long term (current) use of insulin: Secondary | ICD-10-CM

## 2017-02-16 NOTE — Patient Instructions (Signed)
Fall Prevention in the Home Falls can cause injuries. They can happen to people of all ages. There are many things you can do to make your home safe and to help prevent falls. What can I do on the outside of my home?  Regularly fix the edges of walkways and driveways and fix any cracks.  Remove anything that might make you trip as you walk through a door, such as a raised step or threshold.  Trim any bushes or trees on the path to your home.  Use bright outdoor lighting.  Clear any walking paths of anything that might make someone trip, such as rocks or tools.  Regularly check to see if handrails are loose or broken. Make sure that both sides of any steps have handrails.  Any raised decks and porches should have guardrails on the edges.  Have any leaves, snow, or ice cleared regularly.  Use sand or salt on walking paths during winter.  Clean up any spills in your garage right away. This includes oil or grease spills. What can I do in the bathroom?  Use night lights.  Install grab bars by the toilet and in the tub and shower. Do not use towel bars as grab bars.  Use non-skid mats or decals in the tub or shower.  If you need to sit down in the shower, use a plastic, non-slip stool.  Keep the floor dry. Clean up any water that spills on the floor as soon as it happens.  Remove soap buildup in the tub or shower regularly.  Attach bath mats securely with double-sided non-slip rug tape.  Do not have throw rugs and other things on the floor that can make you trip. What can I do in the bedroom?  Use night lights.  Make sure that you have a light by your bed that is easy to reach.  Do not use any sheets or blankets that are too big for your bed. They should not hang down onto the floor.  Have a firm chair that has side arms. You can use this for support while you get dressed.  Do not have throw rugs and other things on the floor that can make you trip. What can I do in the  kitchen?  Clean up any spills right away.  Avoid walking on wet floors.  Keep items that you use a lot in easy-to-reach places.  If you need to reach something above you, use a strong step stool that has a grab bar.  Keep electrical cords out of the way.  Do not use floor polish or wax that makes floors slippery. If you must use wax, use non-skid floor wax.  Do not have throw rugs and other things on the floor that can make you trip. What can I do with my stairs?  Do not leave any items on the stairs.  Make sure that there are handrails on both sides of the stairs and use them. Fix handrails that are broken or loose. Make sure that handrails are as long as the stairways.  Check any carpeting to make sure that it is firmly attached to the stairs. Fix any carpet that is loose or worn.  Avoid having throw rugs at the top or bottom of the stairs. If you do have throw rugs, attach them to the floor with carpet tape.  Make sure that you have a light switch at the top of the stairs and the bottom of the stairs. If you do   not have them, ask someone to add them for you. What else can I do to help prevent falls?  Wear shoes that:  Do not have high heels.  Have rubber bottoms.  Are comfortable and fit you well.  Are closed at the toe. Do not wear sandals.  If you use a stepladder:  Make sure that it is fully opened. Do not climb a closed stepladder.  Make sure that both sides of the stepladder are locked into place.  Ask someone to hold it for you, if possible.  Clearly mark and make sure that you can see:  Any grab bars or handrails.  First and last steps.  Where the edge of each step is.  Use tools that help you move around (mobility aids) if they are needed. These include:  Canes.  Walkers.  Scooters.  Crutches.  Turn on the lights when you go into a dark area. Replace any light bulbs as soon as they burn out.  Set up your furniture so you have a clear path.  Avoid moving your furniture around.  If any of your floors are uneven, fix them.  If there are any pets around you, be aware of where they are.  Review your medicines with your doctor. Some medicines can make you feel dizzy. This can increase your chance of falling. Ask your doctor what other things that you can do to help prevent falls. This information is not intended to replace advice given to you by your health care provider. Make sure you discuss any questions you have with your health care provider. Document Released: 08/26/2009 Document Revised: 04/06/2016 Document Reviewed: 12/04/2014 Elsevier Interactive Patient Education  2017 [employer]  

## 2017-02-16 NOTE — Progress Notes (Signed)
Subjective:    Patient ID: Jamie Cochran, female    DOB: [birth year], 83 y.o.   MRN: [id number]  HPI  Jamie Cochran is here today for follow up of chronic medical problem.  Outpatient Encounter Prescriptions as of 02/16/2017  Medication Sig  . amLODipine (NORVASC) 5 MG tablet TAKE ONE TABLET BY MOUTH ONCE DAILY MUST  BE  SEEN  FOR  ADDITIONAL  REFILLS (Patient taking differently: Take 5 mg by mouth daily. TAKE ONE TABLET BY MOUTH ONCE DAILY MUST  BE  SEEN  FOR  ADDITIONAL  REFILLS)  . aspirin 81 MG tablet Take 81 mg by mouth daily.  . carvedilol (COREG) 25 MG tablet TAKE ONE TABLET BY MOUTH TWICE DAILY WITH MEALS MUST  BE  SEEN  FOR  ADDITIONAL  REFILLS (Patient taking differently: Take 25 mg by mouth 2 (two) times daily with a meal. )  . Cholecalciferol 2000 units TABS Take 1 tablet by mouth daily.  Marland Kitchen CINNAMON PO Take 1 tablet by mouth 2 (two) times daily.  . citalopram (CELEXA) 20 MG tablet TAKE ONE TABLET BY MOUTH ONCE DAILY MUST  BE  SEEN  FOR  ADDITIONAL  REFILLS (Patient taking differently: Take 20 mg by mouth daily. )  . Cyanocobalamin (B-12) 1000 MCG SUBL Place 1,000 mcg under the tongue daily.  Marland Kitchen donepezil (ARICEPT) 10 MG tablet Take 10 mg by mouth at bedtime.  . Fe Fum-FA-B Cmp-C-Zn-Mg-Mn-Cu (HEMOCYTE PLUS) 106-1 MG CAPS Take 1 tablet by mouth daily.  . fenofibrate (TRICOR) 145 MG tablet Take 1 tablet (145 mg total) by mouth daily.  . furosemide (LASIX) 40 MG tablet Take 1 tablet (40 mg total) by mouth daily. (Patient taking differently: Take 40 mg by mouth 2 (two) times daily. )  . Insulin Glargine (LANTUS SOLOSTAR) 100 UNIT/ML Solostar Pen Inject 24 Units into the skin daily at 10 pm.  . levothyroxine (SYNTHROID, LEVOTHROID) 75 MCG tablet Take 1 tablet (75 mcg total) by mouth daily.  Marland Kitchen lisinopril (PRINIVIL,ZESTRIL) 20 MG tablet Take 20 mg by mouth daily.  . meloxicam (MOBIC) 15 MG tablet Take 15 mg by mouth daily.  . memantine (NAMENDA) 10 MG tablet Take 1 tablet (10  mg total) by mouth 2 (two) times daily.  Marland Kitchen omeprazole (PRILOSEC) 40 MG capsule Take 1 capsule (40 mg total) by mouth daily.  . potassium chloride (K-DUR,KLOR-CON) 10 MEQ tablet Take 20 mEq by mouth daily.     1. Chronic diastolic CHF (congestive heart failure) (HCC)  no swelling or SOB  2. Essential hypertension  Does not check blood pressures at home- no c/o HA or chest pain  3. Controlled type 2 diabetes mellitus without complication, with long-term current use of insulin (HCC)  Currently on lantus 24u daily- hgba1c 5.4% 1 month ago  4. Dysphagia, pharyngoesophageal phase  Some days she will not chew her food good and will have trouble swallowing  5. Hypothyroidism, unspecified type  No problems  6. Dementia without behavioral disturbance, unspecified dementia type  Granddaughter says she is about the same- does not recognize her daughter anymmore  7. Recurrent major depressive disorder, in full remission [facility])  Granddaughter says she seems about ythe same  8. Sepsis secondary to UTI [facility])  This has resolved  9. Iron deficiency anemia, unspecified iron deficiency anemia type  Still on iron supplements- sees hematologist- had IV iron yesterday  10. Mixed hyperlipidemia  Doesn't not eat a lot of fatty foods  11. Hypokalemia  No complaint of  lower ext cramping    New complaints: graddaughter says no new complaints     Review of Systems  Constitutional: Positive for fatigue.  HENT: Negative.   Respiratory: Negative.   Cardiovascular: Negative.   Gastrointestinal: Negative.   Genitourinary: Negative.   Neurological: Negative.   Psychiatric/Behavioral: Negative.   All other systems reviewed and are negative.      Objective:   Physical Exam  Constitutional: She appears well-developed and well-nourished. No distress.  HENT:  Right Ear: External ear normal.  Left Ear: External ear normal.  Nose: Nose normal.  Mouth/Throat: Oropharynx is clear and moist.  Very hard of  hearing  Eyes: EOM are normal.  Neck: Trachea normal, normal range of motion and full passive range of motion without pain. Neck supple. No JVD present. Carotid bruit is not present. No thyromegaly present.  Cardiovascular: Normal rate, regular rhythm, normal heart sounds and intact distal pulses.  Exam reveals no gallop and no friction rub.   No murmur heard. Pulmonary/Chest: Effort normal and breath sounds normal.  Abdominal: Soft. Bowel sounds are normal. She exhibits no distension and no mass. There is no tenderness.  Musculoskeletal: Normal range of motion. She exhibits edema (mild edema bil lower ext).  Lymphadenopathy:    She has no cervical adenopathy.  Neurological: She is alert. She has normal reflexes.  Skin: Skin is warm and dry.  Psychiatric: She has a normal mood and affect. Her behavior is normal. Judgment and thought content normal.   BP 138/64   Pulse (!) 53   Temp 97 F (36.1 C) (Oral)   Ht  (1.422 m)   Wt 122 lb 6.4 oz (55.5 kg)   LMP 02/04/1963   BMI 27.44 kg/m       Assessment & Plan:   1. Chronic diastolic CHF (congestive heart failure) (HCC)   2. Essential hypertension   3. Controlled type 2 diabetes mellitus without complication, with long-term current use of insulin (HCC)   4. Dysphagia, pharyngoesophageal phase   5. Hypothyroidism, unspecified type   6. Dementia without behavioral disturbance, unspecified dementia type   7. Recurrent major depressive disorder, in full remission (HCC)   8. Sepsis secondary to UTI (HCC)   9. Iron deficiency anemia, unspecified iron deficiency anemia type   10. Mixed hyperlipidemia   11. Hypokalemia    Continue all meds Continue to orient daily Would like to continue in home physical therapy Follow up in 3 months  Mary-Margaret Daphine Deutscher, FNP

## 2017-02-20 ENCOUNTER — Encounter (HOSPITAL_BASED_OUTPATIENT_CLINIC_OR_DEPARTMENT_OTHER): Payer: Medicare Other | Admitting: Oncology

## 2017-02-20 ENCOUNTER — Encounter (HOSPITAL_COMMUNITY): Payer: Self-pay

## 2017-02-20 ENCOUNTER — Encounter (HOSPITAL_COMMUNITY): Payer: Medicare Other

## 2017-02-20 VITALS — BP 160/59 | HR 60 | Temp 97.9°F | Resp 18 | Wt 123.6 lb

## 2017-02-20 DIAGNOSIS — E538 Deficiency of other specified B group vitamins: Secondary | ICD-10-CM

## 2017-02-20 DIAGNOSIS — D649 Anemia, unspecified: Secondary | ICD-10-CM

## 2017-02-20 DIAGNOSIS — I509 Heart failure, unspecified: Secondary | ICD-10-CM | POA: Diagnosis not present

## 2017-02-20 DIAGNOSIS — E611 Iron deficiency: Secondary | ICD-10-CM

## 2017-02-20 DIAGNOSIS — N189 Chronic kidney disease, unspecified: Secondary | ICD-10-CM

## 2017-02-20 DIAGNOSIS — F0390 Unspecified dementia without behavioral disturbance: Secondary | ICD-10-CM

## 2017-02-20 DIAGNOSIS — Z7982 Long term (current) use of aspirin: Secondary | ICD-10-CM | POA: Diagnosis not present

## 2017-02-20 DIAGNOSIS — Z882 Allergy status to sulfonamides status: Secondary | ICD-10-CM | POA: Diagnosis not present

## 2017-02-20 DIAGNOSIS — Z794 Long term (current) use of insulin: Secondary | ICD-10-CM | POA: Diagnosis not present

## 2017-02-20 DIAGNOSIS — I130 Hypertensive heart and chronic kidney disease with heart failure and stage 1 through stage 4 chronic kidney disease, or unspecified chronic kidney disease: Secondary | ICD-10-CM | POA: Diagnosis not present

## 2017-02-20 DIAGNOSIS — D509 Iron deficiency anemia, unspecified: Secondary | ICD-10-CM

## 2017-02-20 DIAGNOSIS — Z8249 Family history of ischemic heart disease and other diseases of the circulatory system: Secondary | ICD-10-CM | POA: Diagnosis not present

## 2017-02-20 DIAGNOSIS — E1122 Type 2 diabetes mellitus with diabetic chronic kidney disease: Secondary | ICD-10-CM | POA: Diagnosis not present

## 2017-02-20 DIAGNOSIS — Z79899 Other long term (current) drug therapy: Secondary | ICD-10-CM | POA: Diagnosis not present

## 2017-02-20 LAB — CBC WITH DIFFERENTIAL/PLATELET
BASOS ABS: 0 10*3/uL (ref 0.0–0.1)
BASOS PCT: 0 %
Eosinophils Absolute: 0.1 10*3/uL (ref 0.0–0.7)
Eosinophils Relative: 2 %
HEMATOCRIT: 33.3 % — AB (ref 36.0–46.0)
HEMOGLOBIN: 10.9 g/dL — AB (ref 12.0–15.0)
Lymphocytes Relative: 42 %
Lymphs Abs: 2.5 10*3/uL (ref 0.7–4.0)
MCH: 31.6 pg (ref 26.0–34.0)
MCHC: 32.7 g/dL (ref 30.0–36.0)
MCV: 96.5 fL (ref 78.0–100.0)
Monocytes Absolute: 0.4 10*3/uL (ref 0.1–1.0)
Monocytes Relative: 6 %
NEUTROS ABS: 3 10*3/uL (ref 1.7–7.7)
NEUTROS PCT: 50 %
Platelets: 112 10*3/uL — ABNORMAL LOW (ref 150–400)
RBC: 3.45 MIL/uL — ABNORMAL LOW (ref 3.87–5.11)
RDW: 15 % (ref 11.5–15.5)
WBC: 6 10*3/uL (ref 4.0–10.5)

## 2017-02-20 NOTE — Patient Instructions (Signed)
[facility] at [facility] Discharge Instructions  RECOMMENDATIONS MADE BY THE CONSULTANT AND ANY TEST RESULTS WILL BE SENT TO YOUR REFERRING PHYSICIAN.  You were seen today by Dr. Louise Zhou Follow up in 2 months with lab work See Amy up front for appointments   Thank you for choosing [facility] at [facility] to provide your oncology and hematology care.  To afford each patient quality time with our provider, please arrive at least 15 minutes before your scheduled appointment time.    If you have a lab appointment with the Cancer Center please come in thru the  Main Entrance and check in at the main information desk  You need to re-schedule your appointment should you arrive 10 or more minutes late.  We strive to give you quality time with our providers, and arriving late affects you and other patients whose appointments are after yours.  Also, if you no show three or more times for appointments you may be dismissed from the clinic at the providers discretion.     Again, thank you for choosing [facility].  Our hope is that these requests will decrease the amount of time that you wait before being seen by our physicians.       _____________________________________________________________  Should you have questions after your visit to [facility], please contact our office at [phone] between the hours of 8:30 a.m. and 4:30 p.m.  Voicemails left after 4:30 p.m. will not be returned until the following business day.  For prescription refill requests, have your pharmacy contact our office.       Resources For Cancer Patients and their Caregivers ? American Cancer Society: Can assist with transportation, wigs, general needs, runs Look Good Feel Better.        1-888-227-6333 ? Cancer Care: Provides financial assistance, online support groups, medication/co-pay assistance.  1-800-813-HOPE (4673) ? [facility] Assists [place] cancer patients and their families through emotional , educational and financial support.  336-427-4357 ? [place] DSS Where to apply for food stamps, Medicaid and utility assistance. 336-342-1394 ? [facility]: Transportation to medical appointments. 336-347-2287 ? Social Security Administration: May apply for disability if have a Stage IV cancer. 336-342-7796 1-800-772-1213 ? [place] Aging, Disability and Transit Services: Assists with nutrition, care and transit needs. 336-349-2343  Cancer Center Support Programs: [email or web address]@ > Cancer Support Group  2nd Tuesday of the month 1pm-2pm, Journey Room  > Creative Journey  3rd Tuesday of the month 1130am-1pm, Journey Room  > Look Good Feel Better  1st Wednesday of the month 10am-12 noon, Journey Room (Call American Cancer Society to register 1-800-395-5775)    

## 2017-02-20 NOTE — Progress Notes (Signed)
HEMATOLOGY/ONCOLOGY PROGRESS NOTE  Date of Service: 02/20/2017  Patient Care Team: Bennie Pierini, FNP as PCP - General (Nurse Practitioner)  CHIEF COMPLAINTS/PURPOSE OF CONSULTATION:  Anemia  HISTORY OF PRESENTING ILLNESS:  Jamie Cochran is a 83 y.o. female who returns for f/u of normocytic anemia. Pt has hearing loss and dementia. Her granddaughter is her primary caregiver and does most of the talking for the patient.   She is doing well today. She takes B12 daily. She had IV Feraheme 510 mg last week on 02/15/17. She tolerated it well. Her granddaughter says she has been sleeping a lot. Denies chest pain, SOB, abdominal pain, loss of appetite, or any other concerns.   MEDICAL HISTORY:  Past Medical History:  Diagnosis Date  . CHF (congestive heart failure) (HCC)   . Dementia   . Diabetes type 2, uncontrolled (HCC)   . Hearing loss    history of right sided cholesteotoma s/p Right tympanoplasty with ossicular reconstruction. (03/2002)  . Hyperlipidemia   . Hypertension   . Hypothyroidism   . Iron deficiency anemia 02/05/2017  . Non-occlusive coronary artery disease    40% first diagonal stenosis, 50% obtuse marginal stenosis  . Osteoporosis   . Stroke (HCC) in 05/2012, ?   deep right frontal perventricular deep white matter infarct + subacute left thalamic lacunar infarct  . Urinary incontinence    s/p surgery for ureterocele  . UTI (lower urinary tract infection) 09/2015   granddaughter reports "constant UTIs"    SURGICAL HISTORY: Past Surgical History:  Procedure Laterality Date  . ABDOMINAL HYSTERECTOMY    . BLADDER REPAIR    . CESAREAN SECTION    . EYE SURGERY    . INNER EAR SURGERY    . TONSILLECTOMY AND ADENOIDECTOMY      SOCIAL HISTORY: Social History   Social History  . Marital status: Widowed    Spouse name: N/A  . Number of children: N/A  . Years of education: N/A   Occupational History  . retired    Social History Main Topics  .  Smoking status: Never Smoker  . Smokeless tobacco: Never Used  . Alcohol use No  . Drug use: No  . Sexual activity: No   Other Topics Concern  . Not on file   Social History Narrative   Lives at home alone in [place], [place]   //       [place] with ADLS:   Bathing: Yes   Dressing: Yes   Toileting: Yes   Maintaining continence: No   Grooming: Yes   Feeding: Yes   Transferring: Yes         // [place] with IADLS:    Shopping for groceries: Yes    Driving or using public transportation: N/A   Using the telephone: Yes   Performing housework: Yes   Doing home repair: Yes   Doing laundry:  Yes   Taking medications: Yes   Handling finances: Yes                   FAMILY HISTORY: Family History  Problem Relation Age of Onset  . Heart disease Father     ALLERGIES:  is allergic to sulfamethoxazole and sulfa antibiotics.  MEDICATIONS:  Current Outpatient Prescriptions  Medication Sig Dispense Refill  . amLODipine (NORVASC) 5 MG tablet TAKE ONE TABLET BY MOUTH ONCE DAILY MUST  BE  SEEN  FOR  ADDITIONAL  REFILLS (Patient taking differently: Take 5 mg by mouth daily. TAKE ONE TABLET  BY MOUTH ONCE DAILY MUST  BE  SEEN  FOR  ADDITIONAL  REFILLS) 30 tablet 5  . aspirin 81 MG tablet Take 81 mg by mouth daily.    . carvedilol (COREG) 25 MG tablet TAKE ONE TABLET BY MOUTH TWICE DAILY WITH MEALS MUST  BE  SEEN  FOR  ADDITIONAL  REFILLS (Patient taking differently: Take 25 mg by mouth 2 (two) times daily with a meal. ) 60 tablet 5  . Cholecalciferol 2000 units TABS Take 1 tablet by mouth daily.    Marland Kitchen CINNAMON PO Take 1 tablet by mouth 2 (two) times daily.    . citalopram (CELEXA) 20 MG tablet TAKE ONE TABLET BY MOUTH ONCE DAILY MUST  BE  SEEN  FOR  ADDITIONAL  REFILLS (Patient taking differently: Take 20 mg by mouth daily. ) 30 tablet 5  . Cyanocobalamin (B-12) 1000 MCG SUBL Place 1,000 mcg under the tongue daily. 30 each 11  . donepezil (ARICEPT) 10 MG tablet Take 10 mg by mouth  at bedtime.    . Fe Fum-FA-B Cmp-C-Zn-Mg-Mn-Cu (HEMOCYTE PLUS) 106-1 MG CAPS Take 1 tablet by mouth daily. 30 each 5  . fenofibrate (TRICOR) 145 MG tablet Take 1 tablet (145 mg total) by mouth daily. 30 tablet 5  . furosemide (LASIX) 40 MG tablet Take 1 tablet (40 mg total) by mouth daily. (Patient taking differently: Take 40 mg by mouth 2 (two) times daily. ) 60 tablet 3  . Insulin Glargine (LANTUS SOLOSTAR) 100 UNIT/ML Solostar Pen Inject 24 Units into the skin daily at 10 pm.    . levothyroxine (SYNTHROID, LEVOTHROID) 75 MCG tablet Take 1 tablet (75 mcg total) by mouth daily. 30 tablet 5  . lisinopril (PRINIVIL,ZESTRIL) 20 MG tablet Take 20 mg by mouth daily.    . meloxicam (MOBIC) 15 MG tablet Take 15 mg by mouth daily.    . memantine (NAMENDA) 10 MG tablet Take 1 tablet (10 mg total) by mouth 2 (two) times daily. 30 tablet 5  . omeprazole (PRILOSEC) 40 MG capsule Take 1 capsule (40 mg total) by mouth daily. 30 capsule 5  . potassium chloride (K-DUR,KLOR-CON) 10 MEQ tablet Take 20 mEq by mouth daily.     No current facility-administered medications for this visit.    Review of Systems  Constitutional: Positive for malaise/fatigue.       No loss of appetite   HENT: Positive for hearing loss.   Eyes: Negative.   Respiratory: Negative.  Negative for shortness of breath.   Cardiovascular: Negative.  Negative for chest pain.  Gastrointestinal: Negative.  Negative for abdominal pain.  Genitourinary: Negative.   Musculoskeletal: Negative.   Skin: Negative.   Neurological: Negative.   Endo/Heme/Allergies: Negative.   Psychiatric/Behavioral: Positive for memory loss (dementia).  All other systems reviewed and are negative. 14 point review of systems was performed and is negative except as detailed under history of present illness and above  PHYSICAL EXAMINATION: ECOG PERFORMANCE STATUS: 3 - Symptomatic, >50% confined to bed  Vitals:   02/20/17 1415  BP: (!) 160/59  Pulse: 60  Resp:  18  Temp: 97.9 F (36.6 C)   Filed Weights   02/20/17 1415  Weight: 123 lb 9.6 oz (56.1 kg)   .Body mass index is 27.71 kg/m.   Physical Exam  Constitutional: She is oriented to person, place, and time and well-developed, well-nourished, and in no distress.  HENT:  Head: Normocephalic and atraumatic.  Eyes: EOM are normal. Pupils are equal, round, and reactive  to light.  Neck: Normal range of motion. Neck supple.  Cardiovascular: Normal rate, regular rhythm and normal heart sounds.   Pulmonary/Chest: Effort normal and breath sounds normal.  Abdominal: Soft. Bowel sounds are normal.  Musculoskeletal: Normal range of motion.  Neurological: She is alert and oriented to person, place, and time. Gait normal.  Skin: Skin is warm and dry.  Nursing note and vitals reviewed.  LABORATORY DATA:  I have reviewed the data as listed  CBC Latest Ref Rng & Units 02/20/2017 01/29/2017 01/29/2017  WBC 4.0 - 10.5 K/uL 6.0 7.9 -  Hemoglobin 12.0 - 15.0 g/dL 10.9(L) 10.9(L) -  Hematocrit 36.0 - 46.0 % 33.3(L) 32.4(L) 32.9(L)  Platelets 150 - 400 K/uL PENDING 158 -    . CMP Latest Ref Rng & Units 01/29/2017 01/16/2017 01/10/2017  Glucose 65 - 99 mg/dL 161(W) 960(A) 540(J)  BUN 6 - 20 mg/dL 81(X) 91(Y) 12  Creatinine 0.44 - 1.00 mg/dL 7.82(N) 5.62(Z) 3.08(M)  Sodium 135 - 145 mmol/L 139 142 143  Potassium 3.5 - 5.1 mmol/L 3.6 3.4(L) 4.1  Chloride 101 - 111 mmol/L 102 93(L) 104  CO2 22 - 32 mmol/L 31 33(H) 25  Calcium 8.9 - 10.3 mg/dL 9.3 57.8 4.6(N)  Total Protein 6.5 - 8.1 g/dL 7.0 - 5.2(L)  Total Bilirubin 0.3 - 1.2 mg/dL 0.4 - 0.2  Alkaline Phos 38 - 126 U/L 37(L) - 33(L)  AST 15 - 41 U/L 18 - 17  ALT 14 - 54 U/L 9(L) - 14   RADIOGRAPHIC STUDIES: I have personally reviewed the radiological images as listed and agreed with the findings in the report.  DG Chest 2 View 01/10/2017 IMPRESSION: Chronic bronchitic changes. Bilateral hypoinflation with small bilateral pleural effusions. Left  basilar subsegmental atelectasis likely in the lingula.  Cardiomegaly without pulmonary edema.  ASSESSMENT & PLAN:  83 yo female with multiple medical co-morbidities with   1) Acute on chronic anemia due to chronic inflammatory disease from CKD.  2) B12 deficiency 3) Mild iron deficiency with ferritin 35 s/p 1 dose of feraheme on 02/15/17  PLAN: continue taking oral B12.  Hemoglobin is too high at this time to need procrit or aranesp.  Continue observation of labs.  She will return for follow up in 2 months with labs.   Orders Placed This Encounter  Procedures  . CBC with Differential    Standing Status:   Future    Standing Expiration Date:   06/22/2017  . Comprehensive metabolic panel    Standing Status:   Future    Standing Expiration Date:   06/22/2017  . Iron and TIBC    Standing Status:   Future    Standing Expiration Date:   06/22/2017  . Ferritin    Standing Status:   Future    Standing Expiration Date:   06/22/2017  . Vitamin B12    Standing Status:   Future    Standing Expiration Date:   06/22/2017    All of the patients questions were answered with apparent satisfaction. The patient knows to call the clinic with any problems, questions or concerns.  This document serves as a record of services personally performed by Ralene Cork, MD. It was created on her behalf by [place] Casey, a trained medical scribe. The creation of this record is based on the scribe's personal observations and the provider's statements to them. This document has been checked and approved by the attending provider.  I have reviewed the above documentation for accuracy and  completeness and I agree with the above.  This note was electronically signed.   02/20/2017 2:36 PM

## 2017-02-24 ENCOUNTER — Other Ambulatory Visit: Payer: Self-pay | Admitting: Nurse Practitioner

## 2017-02-24 DIAGNOSIS — M542 Cervicalgia: Secondary | ICD-10-CM

## 2017-02-26 NOTE — Telephone Encounter (Signed)
Last seen 02/16/17- ? Hospital stopped this per note?? Please address.

## 2017-03-18 ENCOUNTER — Other Ambulatory Visit: Payer: Self-pay | Admitting: Nurse Practitioner

## 2017-03-18 DIAGNOSIS — G3183 Dementia with Lewy bodies: Principal | ICD-10-CM

## 2017-03-18 DIAGNOSIS — F0281 Dementia in other diseases classified elsewhere with behavioral disturbance: Secondary | ICD-10-CM

## 2017-03-19 ENCOUNTER — Telehealth: Payer: Self-pay | Admitting: Nurse Practitioner

## 2017-03-19 NOTE — Telephone Encounter (Signed)
Pt granddaughter aware

## 2017-03-19 NOTE — Telephone Encounter (Signed)
Patient is to unstable to increase sleeping mdication

## 2017-03-22 ENCOUNTER — Other Ambulatory Visit: Payer: Medicare Other

## 2017-03-28 ENCOUNTER — Telehealth: Payer: Self-pay | Admitting: Nurse Practitioner

## 2017-03-29 NOTE — Telephone Encounter (Signed)
Notified niece Marchelle Folksmanda that urine sample was not done when she brought it in. Advised niece to bring in another sample and we would check it. Niece verbalized understanding

## 2017-04-10 ENCOUNTER — Other Ambulatory Visit: Payer: Medicare Other

## 2017-04-10 DIAGNOSIS — R410 Disorientation, unspecified: Secondary | ICD-10-CM

## 2017-04-12 ENCOUNTER — Other Ambulatory Visit: Payer: Self-pay | Admitting: Nurse Practitioner

## 2017-04-12 LAB — URINE CULTURE

## 2017-04-12 MED ORDER — CIPROFLOXACIN HCL 500 MG PO TABS: 500.0000 mg | ORAL_TABLET | Freq: Two times a day (BID) | ORAL | 0 refills | Status: DC

## 2017-04-13 LAB — URINALYSIS, COMPLETE
Bilirubin, UA: NEGATIVE
GLUCOSE, UA: NEGATIVE
KETONES UA: NEGATIVE
NITRITE UA: NEGATIVE
SPEC GRAV UA: 1.02 (ref 1.005–1.030)
Urobilinogen, Ur: 0.2 mg/dL (ref 0.2–1.0)
pH, UA: 6.5 (ref 5.0–7.5)

## 2017-04-13 LAB — MICROSCOPIC EXAMINATION: WBC, UA: >30 /hpf — AB (ref 0–?)

## 2017-04-24 ENCOUNTER — Other Ambulatory Visit (HOSPITAL_COMMUNITY): Payer: Medicare Other

## 2017-04-24 ENCOUNTER — Ambulatory Visit (HOSPITAL_COMMUNITY): Payer: Medicare Other | Admitting: Adult Health

## 2017-05-04 ENCOUNTER — Other Ambulatory Visit (HOSPITAL_COMMUNITY): Payer: Medicare Other

## 2017-05-04 ENCOUNTER — Ambulatory Visit (HOSPITAL_COMMUNITY): Payer: Medicare Other | Admitting: Adult Health

## 2017-05-11 ENCOUNTER — Other Ambulatory Visit: Payer: Self-pay | Admitting: Nurse Practitioner

## 2017-05-21 ENCOUNTER — Other Ambulatory Visit: Payer: Self-pay | Admitting: Nurse Practitioner

## 2017-05-21 DIAGNOSIS — E876 Hypokalemia: Secondary | ICD-10-CM

## 2017-05-22 ENCOUNTER — Ambulatory Visit: Payer: Medicare Other | Admitting: Nurse Practitioner

## 2017-05-29 ENCOUNTER — Encounter: Payer: Self-pay | Admitting: Nurse Practitioner

## 2017-05-29 ENCOUNTER — Ambulatory Visit (INDEPENDENT_AMBULATORY_CARE_PROVIDER_SITE_OTHER): Payer: Medicare Other | Admitting: Nurse Practitioner

## 2017-05-29 VITALS — BP 124/67 | HR 58 | Temp 97.0°F | Ht <= 58 in | Wt 120.0 lb

## 2017-05-29 DIAGNOSIS — F3341 Major depressive disorder, recurrent, in partial remission: Secondary | ICD-10-CM

## 2017-05-29 DIAGNOSIS — I5032 Chronic diastolic (congestive) heart failure: Secondary | ICD-10-CM | POA: Diagnosis not present

## 2017-05-29 DIAGNOSIS — F0390 Unspecified dementia without behavioral disturbance: Secondary | ICD-10-CM

## 2017-05-29 DIAGNOSIS — E119 Type 2 diabetes mellitus without complications: Secondary | ICD-10-CM | POA: Diagnosis not present

## 2017-05-29 DIAGNOSIS — N3000 Acute cystitis without hematuria: Secondary | ICD-10-CM

## 2017-05-29 DIAGNOSIS — R300 Dysuria: Secondary | ICD-10-CM

## 2017-05-29 DIAGNOSIS — E039 Hypothyroidism, unspecified: Secondary | ICD-10-CM | POA: Diagnosis not present

## 2017-05-29 DIAGNOSIS — E876 Hypokalemia: Secondary | ICD-10-CM

## 2017-05-29 DIAGNOSIS — D631 Anemia in chronic kidney disease: Secondary | ICD-10-CM

## 2017-05-29 DIAGNOSIS — I10 Essential (primary) hypertension: Secondary | ICD-10-CM

## 2017-05-29 DIAGNOSIS — N184 Chronic kidney disease, stage 4 (severe): Secondary | ICD-10-CM

## 2017-05-29 DIAGNOSIS — E782 Mixed hyperlipidemia: Secondary | ICD-10-CM | POA: Diagnosis not present

## 2017-05-29 DIAGNOSIS — N393 Stress incontinence (female) (male): Secondary | ICD-10-CM

## 2017-05-29 DIAGNOSIS — D509 Iron deficiency anemia, unspecified: Secondary | ICD-10-CM | POA: Diagnosis not present

## 2017-05-29 DIAGNOSIS — F3342 Major depressive disorder, recurrent, in full remission: Secondary | ICD-10-CM

## 2017-05-29 DIAGNOSIS — G3183 Dementia with Lewy bodies: Secondary | ICD-10-CM

## 2017-05-29 DIAGNOSIS — M542 Cervicalgia: Secondary | ICD-10-CM

## 2017-05-29 DIAGNOSIS — F0281 Dementia in other diseases classified elsewhere with behavioral disturbance: Secondary | ICD-10-CM

## 2017-05-29 DIAGNOSIS — Z794 Long term (current) use of insulin: Secondary | ICD-10-CM

## 2017-05-29 DIAGNOSIS — D696 Thrombocytopenia, unspecified: Secondary | ICD-10-CM

## 2017-05-29 DIAGNOSIS — K219 Gastro-esophageal reflux disease without esophagitis: Secondary | ICD-10-CM

## 2017-05-29 LAB — BAYER DCA HB A1C WAIVED: HB A1C (BAYER DCA - WAIVED): 5.8 % (ref <7.0)

## 2017-05-29 LAB — MICROSCOPIC EXAMINATION

## 2017-05-29 LAB — URINALYSIS, COMPLETE
BILIRUBIN UA: NEGATIVE
Glucose, UA: NEGATIVE
KETONES UA: NEGATIVE
NITRITE UA: POSITIVE — AB
Protein, UA: NEGATIVE
SPEC GRAV UA: 1.02 (ref 1.005–1.030)
Urobilinogen, Ur: 0.2 mg/dL (ref 0.2–1.0)
pH, UA: 6 (ref 5.0–7.5)

## 2017-05-29 MED ORDER — OMEPRAZOLE 40 MG PO CPDR
40.0000 mg | DELAYED_RELEASE_CAPSULE | Freq: Every day | ORAL | 5 refills | Status: AC
Start: 2017-05-29 — End: ?

## 2017-05-29 MED ORDER — POTASSIUM CHLORIDE ER 10 MEQ PO TBCR: 20.0000 meq | EXTENDED_RELEASE_TABLET | Freq: Every day | ORAL | 5 refills | Status: AC

## 2017-05-29 MED ORDER — MELOXICAM 15 MG PO TABS: 15.0000 mg | ORAL_TABLET | Freq: Every day | ORAL | 5 refills | Status: AC

## 2017-05-29 MED ORDER — LEVOTHYROXINE SODIUM 75 MCG PO TABS: 75.0000 ug | ORAL_TABLET | Freq: Every day | ORAL | 5 refills | Status: AC

## 2017-05-29 MED ORDER — INSULIN GLARGINE 100 UNIT/ML SOLOSTAR PEN: 24.0000 [IU] | PEN_INJECTOR | Freq: Every day | SUBCUTANEOUS | 3 refills | Status: AC

## 2017-05-29 MED ORDER — OXYBUTYNIN CHLORIDE ER 15 MG PO TB24: 15.0000 mg | ORAL_TABLET | Freq: Every day | ORAL | 5 refills | Status: AC

## 2017-05-29 MED ORDER — FENOFIBRATE 145 MG PO TABS: 145.0000 mg | ORAL_TABLET | Freq: Every day | ORAL | 5 refills | Status: AC

## 2017-05-29 MED ORDER — MEMANTINE HCL 10 MG PO TABS: 10.0000 mg | ORAL_TABLET | Freq: Two times a day (BID) | ORAL | 5 refills | Status: AC

## 2017-05-29 MED ORDER — CITALOPRAM HYDROBROMIDE 20 MG PO TABS: 20.0000 mg | ORAL_TABLET | Freq: Every day | ORAL | 5 refills | Status: AC

## 2017-05-29 MED ORDER — DONEPEZIL HCL 10 MG PO TABS: 10.0000 mg | ORAL_TABLET | Freq: Every day | ORAL | 5 refills | Status: AC

## 2017-05-29 MED ORDER — DOXYCYCLINE HYCLATE 100 MG PO TABS: 100.0000 mg | ORAL_TABLET | Freq: Two times a day (BID) | ORAL | 0 refills | Status: AC

## 2017-05-29 MED ORDER — HEMOCYTE PLUS 106-1 MG PO CAPS: 1.0000 | ORAL_CAPSULE | Freq: Every day | ORAL | 5 refills | Status: AC

## 2017-05-29 MED ORDER — FUROSEMIDE 40 MG PO TABS: 40.0000 mg | ORAL_TABLET | Freq: Two times a day (BID) | ORAL | 5 refills | Status: AC

## 2017-05-29 MED ORDER — LISINOPRIL 20 MG PO TABS: 20.0000 mg | ORAL_TABLET | Freq: Every day | ORAL | 5 refills | Status: AC

## 2017-05-29 MED ORDER — CARVEDILOL 25 MG PO TABS: 25.0000 mg | ORAL_TABLET | Freq: Two times a day (BID) | ORAL | 5 refills | Status: AC

## 2017-05-29 MED ORDER — AMLODIPINE BESYLATE 5 MG PO TABS: 5.0000 mg | ORAL_TABLET | Freq: Every day | ORAL | 5 refills | Status: AC

## 2017-05-29 NOTE — Progress Notes (Signed)
Subjective:    Patient ID: Jamie Cochran, female    DOB: [birth year], 83 y.o.   MRN: [id number]  HPI Jamie Cochran is here today for follow up of chronic medical problem.  Outpatient Encounter Prescriptions as of 05/29/2017  Medication Sig  . amLODipine (NORVASC) 5 MG tablet TAKE ONE TABLET BY MOUTH ONCE DAILY MUST  BE  SEEN  FOR  ADDITIONAL  REFILLS (Patient taking differently: Take 5 mg by mouth daily. TAKE ONE TABLET BY MOUTH ONCE DAILY MUST  BE  SEEN  FOR  ADDITIONAL  REFILLS)  . aspirin 81 MG tablet Take 81 mg by mouth daily.  . carvedilol (COREG) 25 MG tablet TAKE ONE TABLET BY MOUTH TWICE DAILY WITH MEALS MUST  BE  SEEN  FOR  ADDITIONAL  REFILLS (Patient taking differently: Take 25 mg by mouth 2 (two) times daily with a meal. )  . Cholecalciferol 2000 units TABS Take 1 tablet by mouth daily.  Marland Kitchen CINNAMON PO Take 1 tablet by mouth 2 (two) times daily.  . ciprofloxacin (CIPRO) 500 MG tablet Take 1 tablet (500 mg total) by mouth 2 (two) times daily.  . citalopram (CELEXA) 20 MG tablet TAKE ONE TABLET BY MOUTH ONCE DAILY MUST  BE  SEEN  FOR  ADDITIONAL  REFILLS (Patient taking differently: Take 20 mg by mouth daily. )  . Cyanocobalamin (B-12) 1000 MCG SUBL Place 1,000 mcg under the tongue daily.  Marland Kitchen donepezil (ARICEPT) 10 MG tablet Take 10 mg by mouth at bedtime.  . donepezil (ARICEPT) 10 MG tablet TAKE ONE TABLET BY MOUTH AT BEDTIME  . Fe Fum-FA-B Cmp-C-Zn-Mg-Mn-Cu (HEMOCYTE PLUS) 106-1 MG CAPS Take 1 tablet by mouth daily.  . fenofibrate (TRICOR) 145 MG tablet Take 1 tablet (145 mg total) by mouth daily.  . furosemide (LASIX) 40 MG tablet Take 1 tablet (40 mg total) by mouth daily. (Patient taking differently: Take 40 mg by mouth 2 (two) times daily. )  . Insulin Glargine (LANTUS SOLOSTAR) 100 UNIT/ML Solostar Pen Inject 24 Units into the skin daily at 10 pm.  . levothyroxine (SYNTHROID, LEVOTHROID) 75 MCG tablet Take 1 tablet (75 mcg total) by mouth daily.  Marland Kitchen lisinopril  (PRINIVIL,ZESTRIL) 20 MG tablet Take 20 mg by mouth daily.  . meloxicam (MOBIC) 15 MG tablet Take 15 mg by mouth daily.  . meloxicam (MOBIC) 15 MG tablet TAKE ONE TABLET BY MOUTH ONCE DAILY  . memantine (NAMENDA) 10 MG tablet TAKE ONE TABLET BY MOUTH TWICE DAILY  . omeprazole (PRILOSEC) 40 MG capsule Take 1 capsule (40 mg total) by mouth daily.  . potassium chloride (K-DUR) 10 MEQ tablet TAKE TWO TABLETS BY MOUTH ONCE DAILY  . potassium chloride (K-DUR,KLOR-CON) 10 MEQ tablet Take 20 mEq by mouth daily.   No facility-administered encounter medications on file as of 05/29/2017.     1. Essential hypertension  No c/o chest pain,SOB or headache. Granddaughter does not have way of checking blood pressures at home  2. Chronic diastolic CHF (congestive heart failure) (HCC)  No sob that family has noticed- no edema  3. Hypothyroidism, unspecified type  No problems that aware of  4. Controlled type 2 diabetes mellitus without complication, with long-term current use of insulin ([place]) last HGBA1c was 5.4%. She has not needed any insulin in years. Stopped metformin due  To kidney function. Is still taking lantus daily. Granddaughter does  check blood sugars 3x a day.  5. Dementia without behavioral disturbance, unspecified dementia type  Gradually worsening. Has  really hard time getting her to take a bath. Tlaks to people that aren't there  6. Thrombocytopenia ([place])  Needs labs rechecked  7. Stress incontinence, female  Currently not taking any medication- is incontinent- granddaughter wants to try ditropan  8. Iron deficiency anemia, unspecified iron deficiency anemia type  Needs labs checked  9. Hypokalemia  No c/o lower ext cramps  10. Mixed hyperlipidemia  Granddaughter is trying to not give her a lot of fried foods  11. Recurrent major depressive disorder, in full remission ([place])  celexa daily- because of dementia- can't tell if med is working or not.    New complaints: Urine has a strong  odor  Social history: Lives with granddaughter whom is her caretaker.    Review of Systems  Constitutional: Negative.  Negative for activity change and appetite change.  HENT: Negative.   Eyes: Negative for pain.  Respiratory: Negative.  Negative for shortness of breath.   Cardiovascular: Negative.  Negative for chest pain, palpitations and leg swelling.  Gastrointestinal: Negative for abdominal pain.  Endocrine: Negative for polydipsia.  Genitourinary: Negative.   Skin: Negative for rash.  Neurological: Negative for dizziness, weakness and headaches.  Hematological: Does not bruise/bleed easily.  Psychiatric/Behavioral: Positive for behavioral problems, confusion and hallucinations.  All other systems reviewed and are negative.      Objective:   Physical Exam  Constitutional: She appears well-developed and well-nourished.  HENT:  Nose: Nose normal.  Mouth/Throat: Oropharynx is clear and moist.  Eyes: EOM are normal.  Neck: Trachea normal, normal range of motion and full passive range of motion without pain. Neck supple. No JVD present. Carotid bruit is not present. No thyromegaly present.  Cardiovascular: Normal rate, regular rhythm, normal heart sounds and intact distal pulses.  Exam reveals no gallop and no friction rub.   No murmur heard. Pulmonary/Chest: Effort normal and breath sounds normal.  Abdominal: Soft. Bowel sounds are normal. She exhibits no distension and no mass. There is no tenderness.  Musculoskeletal: Normal range of motion.  Lymphadenopathy:    She has no cervical adenopathy.  Neurological: She is alert. She has normal reflexes.  Skin: Skin is warm and dry.  Psychiatric: She has a normal mood and affect. Her behavior is normal. Judgment and thought content normal.  Appears distant Stares off into space unless being directly spoken to.    BP 124/67   Pulse (!) 58   Temp (!) 97 F (36.1 C) (Oral)   Ht _0  (1.422 m)   Wt 120 lb (54.4 kg)   LMP  02/04/1963   BMI 26.90 kg/m   hgba1c was 5.8%       Assessment & Plan:  1. Essential hypertension Low sodium diet - CMP14+EGFR - carvedilol (COREG) 25 MG tablet; Take 1 tablet (25 mg total) by mouth 2 (two) times daily with a meal.  Dispense: 60 tablet; Refill: 5 - amLODipine (NORVASC) 5 MG tablet; Take 1 tablet (5 mg total) by mouth daily. TAKE ONE TABLET BY MOUTH ONCE DAILY MUST  BE  SEEN  FOR  ADDITIONAL  REFILLS  Dispense: 30 tablet; Refill: 5 - furosemide (LASIX) 40 MG tablet; Take 1 tablet (40 mg total) by mouth 2 (two) times daily.  Dispense: 60 tablet; Refill: 5 - lisinopril (PRINIVIL,ZESTRIL) 20 MG tablet; Take 1 tablet (20 mg total) by mouth daily.  Dispense: 30 tablet; Refill: 5  2. Chronic diastolic CHF (congestive heart failure) (HCC) Limit fluid intake  3. Hypothyroidism, unspecified type - Thyroid Panel  With TSH - levothyroxine (SYNTHROID, LEVOTHROID) 75 MCG tablet; Take 1 tablet (75 mcg total) by mouth daily.  Dispense: 30 tablet; Refill: 5  4. Controlled type 2 diabetes mellitus without complication, with long-term current use of insulin (HCC) Continue to watch carbsin diet Only need to check blood sugar 1x a day and prn - Bayer DCA Hb A1c Waived - Insulin Glargine (LANTUS SOLOSTAR) 100 UNIT/ML Solostar Pen; Inject 24 Units into the skin daily at 10 pm.  Dispense: 15 mL; Refill: 3  5. Dementia without behavioral disturbance, unspecified dementia type Continue to orient - donepezil (ARICEPT) 10 MG tablet; Take 1 tablet (10 mg total) by mouth at bedtime.  Dispense: 30 tablet; Refill: 5  6. Thrombocytopenia ([place]) Labs pending - oxybutynin (DITROPAN XL) 15 MG 24 hr tablet; Take 1 tablet (15 mg total) by mouth at bedtime.  Dispense: 30 tablet; Refill: 5  7. Stress incontinence, female Added ditropan to meds- may take a month to start working  8. Iron deficiency anemia, unspecified iron deficiency anemia type Labs pending Continue iron  9. Hypokalemia -  potassium chloride (K-DUR) 10 MEQ tablet; Take 2 tablets (20 mEq total) by mouth daily.  Dispense: 60 tablet; Refill: 5  10. Mixed hyperlipidemia - Lipid panel - fenofibrate (TRICOR) 145 MG tablet; Take 1 tablet (145 mg total) by mouth daily.  Dispense: 30 tablet; Refill: 5  11. Recurrent major depressive disorder, in full remission ([place])   12. Dysuria - Urinalysis, Complete  13. Anemia in stage 4 chronic kidney disease (HCC) - Fe Fum-FA-B Cmp-C-Zn-Mg-Mn-Cu (HEMOCYTE PLUS) 106-1 MG CAPS; Take 1 tablet by mouth daily.  Dispense: 30 each; Refill: 5  14. Gastroesophageal reflux disease without esophagitis Avoid spicy foods Do not eat 2 hours prior to bedtime - omeprazole (PRILOSEC) 40 MG capsule; Take 1 capsule (40 mg total) by mouth daily.  Dispense: 30 capsule; Refill: 5  15. Lewy body dementia with behavioral disturbance - memantine (NAMENDA) 10 MG tablet; Take 1 tablet (10 mg total) by mouth 2 (two) times daily.  Dispense: 30 tablet; Refill: 5  16. Recurrent major depressive disorder, in partial remission (HCC) - citalopram (CELEXA) 20 MG tablet; Take 1 tablet (20 mg total) by mouth daily.  Dispense: 30 tablet; Refill: 5  17. Neck pain - meloxicam (MOBIC) 15 MG tablet; Take 1 tablet (15 mg total) by mouth daily.  Dispense: 30 tablet; Refill: 5  18. Acute cystitis without hematuria Culture obtained- pending results - doxycycline (VIBRA-TABS) 100 MG tablet; Take 1 tablet (100 mg total) by mouth 2 (two) times daily. 1 po bid  Dispense: 20 tablet; Refill: 0    Labs pending Health maintenance reviewed Diet and exercise encouraged Continue all meds Follow up  In 3 months   [place], FNP

## 2017-05-29 NOTE — Patient Instructions (Signed)
Dementia Dementia is the loss of two or more brain functions, such as:  Memory.  Decision making.  Behavior.  Speaking.  Thinking.  Problem solving.  There are many types of dementia. The most common type is called progressive dementia. Progressive dementia gets worse with time and it is irreversible. An example of this type of dementia is Alzheimer disease. What are the causes? This condition may be caused by:  Nerve cell damage in the brain.  Genetic mutations.  Certain medicines.  Multiple small strokes.  An infection, such as chronic meningitis.  A metabolic problem, such as vitamin B12 deficiency or thyroid disease.  Pressure on the brain, such as from a tumor or blood clot.  What are the signs or symptoms? Symptoms of this condition include:  Sudden changes in mood.  Depression.  Problems with balance.  Changes in personality.  Poor short-term memory.  Agitation.  Delusions.  Hallucinations.  Having a hard time: ? Speaking thoughts. ? Finding words. ? Solving problems. ? Doing familiar tasks. ? Understanding familiar ideas.  How is this diagnosed? This condition is diagnosed with an assessment by your health care provider. During this assessment, your health care provider will talk with you and your family, friends, or caregivers about your symptoms. A thorough medical history will be taken, and you will have a physical exam and tests. Tests may include:  Lab tests, such as blood or urine tests.  Imaging tests, such as a CT scan, PET scan, or MRI.  A lumbar puncture. This test involves removing and testing a small amount of the fluid that surrounds the brain and spinal cord.  An electroencephalogram (EEG). In this test, small metal discs are used to measure electrical activity in the brain.  Memory tests, cognitive tests, and neuropsychological tests. These tests evaluate brain function.  How is this treated? Treatment depends on the  cause of the dementia. It may involve taking medicines that may help:  To control the dementia.  To slow down the disease.  To manage symptoms.  In some cases, treating the cause of the dementia can improve symptoms, reverse symptoms, or slow down how quickly the dementia gets worse. Your health care provider can help direct you to support groups, organizations, and other health care providers who can help with decisions about your care. Follow these instructions at home: Medicine  Take over-the-counter and prescription medicines only as told by your health care provider.  Avoid taking medicines that can affect thinking, such as pain or sleeping medicines. Lifestyle   Make healthy lifestyle choices: ? Be physically active as told by your health care provider. ? Do not use any tobacco products, such as cigarettes, chewing tobacco, and e-cigarettes. If you need help quitting, ask your health care provider. ? Eat a healthy diet. ? Practice stress-management techniques when you get stressed. ? Stay social.  Drink enough fluid to keep your urine clear or pale yellow.  Make sure to get quality sleep. These tips can help you to get a good night's rest: ? Avoid napping during the day. ? Keep your sleeping area dark and cool. ? Avoid exercising during the few hours before you go to bed. ? Avoid caffeine products in the evening. General instructions  Work with your health care provider to determine what you need help with and what your safety needs are.  If you were given a bracelet that tracks your location, make sure to wear it.  Keep all follow-up visits as told by your   health care provider. This is important. Contact a health care provider if:  You have any new symptoms.  You have problems with choking or swallowing.  You have any symptoms of a different illness. Get help right away if:  You develop a fever.  You have new or worsening confusion.  You have new or  worsening sleepiness.  You have a hard time staying awake.  You or your family members become concerned for your safety. This information is not intended to replace advice given to you by your health care provider. Make sure you discuss any questions you have with your health care provider. Document Released: 04/25/2001 Document Revised: 03/09/2016 Document Reviewed: 07/28/2015 Elsevier Interactive Patient Education  2017 Elsevier Inc.  

## 2017-05-30 LAB — LIPID PANEL
Chol/HDL Ratio: 5.8 ratio — ABNORMAL HIGH (ref 0.0–4.4)
Cholesterol, Total: 150 mg/dL (ref 100–199)
HDL: 26 mg/dL — ABNORMAL LOW (ref >39)
LDL CALC: 65 mg/dL (ref 0–99)
Triglycerides: 297 mg/dL — ABNORMAL HIGH (ref 0–149)
VLDL CHOLESTEROL CAL: 59 mg/dL — AB (ref 5–40)

## 2017-05-30 LAB — CMP14+EGFR
ALBUMIN: 3.8 g/dL (ref 3.5–4.7)
ALT: 10 IU/L (ref 0–32)
AST: 18 IU/L (ref 0–40)
Albumin/Globulin Ratio: 1.6 (ref 1.2–2.2)
Alkaline Phosphatase: 41 IU/L (ref 39–117)
BUN / CREAT RATIO: 26 (ref 12–28)
BUN: 41 mg/dL — AB (ref 8–27)
Bilirubin Total: 0.3 mg/dL (ref 0.0–1.2)
CO2: 25 mmol/L (ref 20–29)
CREATININE: 1.58 mg/dL — AB (ref 0.57–1.00)
Calcium: 9.2 mg/dL (ref 8.7–10.3)
Chloride: 103 mmol/L (ref 96–106)
GFR calc Af Amer: 34 mL/min/{1.73_m2} — ABNORMAL LOW (ref >59)
GFR calc non Af Amer: 30 mL/min/{1.73_m2} — ABNORMAL LOW (ref >59)
GLOBULIN, TOTAL: 2.4 g/dL (ref 1.5–4.5)
Glucose: 118 mg/dL — ABNORMAL HIGH (ref 65–99)
Potassium: 3.9 mmol/L (ref 3.5–5.2)
Sodium: 146 mmol/L — ABNORMAL HIGH (ref 134–144)
Total Protein: 6.2 g/dL (ref 6.0–8.5)

## 2017-05-30 LAB — THYROID PANEL WITH TSH
Free Thyroxine Index: 2.5 (ref 1.2–4.9)
T3 UPTAKE RATIO: 29 % (ref 24–39)
T4 TOTAL: 8.6 ug/dL (ref 4.5–12.0)
TSH: 3.99 u[IU]/mL (ref 0.450–4.500)

## 2017-05-31 ENCOUNTER — Encounter (HOSPITAL_COMMUNITY): Payer: Self-pay | Admitting: Adult Health

## 2017-05-31 ENCOUNTER — Encounter (HOSPITAL_COMMUNITY): Payer: Medicare Other | Attending: Oncology

## 2017-05-31 ENCOUNTER — Encounter (HOSPITAL_BASED_OUTPATIENT_CLINIC_OR_DEPARTMENT_OTHER): Payer: Medicare Other | Admitting: Adult Health

## 2017-05-31 VITALS — BP 157/54 | HR 63 | Resp 16 | Ht <= 58 in | Wt 122.0 lb

## 2017-05-31 DIAGNOSIS — D649 Anemia, unspecified: Secondary | ICD-10-CM

## 2017-05-31 DIAGNOSIS — D509 Iron deficiency anemia, unspecified: Secondary | ICD-10-CM | POA: Diagnosis present

## 2017-05-31 DIAGNOSIS — E538 Deficiency of other specified B group vitamins: Secondary | ICD-10-CM

## 2017-05-31 DIAGNOSIS — F0390 Unspecified dementia without behavioral disturbance: Secondary | ICD-10-CM

## 2017-05-31 DIAGNOSIS — D696 Thrombocytopenia, unspecified: Secondary | ICD-10-CM | POA: Diagnosis not present

## 2017-05-31 DIAGNOSIS — Z87440 Personal history of urinary (tract) infections: Secondary | ICD-10-CM | POA: Diagnosis not present

## 2017-05-31 DIAGNOSIS — K5900 Constipation, unspecified: Secondary | ICD-10-CM

## 2017-05-31 LAB — COMPREHENSIVE METABOLIC PANEL
ALK PHOS: 35 U/L — AB (ref 38–126)
ALT: 12 U/L — AB (ref 14–54)
ANION GAP: 8 (ref 5–15)
AST: 22 U/L (ref 15–41)
Albumin: 3.6 g/dL (ref 3.5–5.0)
BILIRUBIN TOTAL: 0.7 mg/dL (ref 0.3–1.2)
BUN: 41 mg/dL — ABNORMAL HIGH (ref 6–20)
CALCIUM: 9.3 mg/dL (ref 8.9–10.3)
CO2: 30 mmol/L (ref 22–32)
CREATININE: 1.83 mg/dL — AB (ref 0.44–1.00)
Chloride: 102 mmol/L (ref 101–111)
GFR, EST AFRICAN AMERICAN: 28 mL/min — AB (ref >60)
GFR, EST NON AFRICAN AMERICAN: 24 mL/min — AB (ref >60)
Glucose, Bld: 95 mg/dL (ref 65–99)
Potassium: 4 mmol/L (ref 3.5–5.1)
SODIUM: 140 mmol/L (ref 135–145)
TOTAL PROTEIN: 6.5 g/dL (ref 6.5–8.1)

## 2017-05-31 LAB — CBC WITH DIFFERENTIAL/PLATELET
Basophils Absolute: 0 10*3/uL (ref 0.0–0.1)
Basophils Relative: 0 %
EOS ABS: 0.1 10*3/uL (ref 0.0–0.7)
Eosinophils Relative: 2 %
HCT: 35.9 % — ABNORMAL LOW (ref 36.0–46.0)
HEMOGLOBIN: 12.2 g/dL (ref 12.0–15.0)
LYMPHS ABS: 3.1 10*3/uL (ref 0.7–4.0)
LYMPHS PCT: 38 %
MCH: 33.2 pg (ref 26.0–34.0)
MCHC: 34 g/dL (ref 30.0–36.0)
MCV: 97.8 fL (ref 78.0–100.0)
MONOS PCT: 9 %
Monocytes Absolute: 0.7 10*3/uL (ref 0.1–1.0)
NEUTROS PCT: 51 %
Neutro Abs: 4.2 10*3/uL (ref 1.7–7.7)
Platelets: 135 10*3/uL — ABNORMAL LOW (ref 150–400)
RBC: 3.67 MIL/uL — AB (ref 3.87–5.11)
RDW: 12.8 % (ref 11.5–15.5)
WBC: 8.2 10*3/uL (ref 4.0–10.5)

## 2017-05-31 LAB — IRON AND TIBC
IRON: 86 ug/dL (ref 28–170)
SATURATION RATIOS: 21 % (ref 10.4–31.8)
TIBC: 405 ug/dL (ref 250–450)
UIBC: 319 ug/dL

## 2017-05-31 LAB — FERRITIN: Ferritin: 159 ng/mL (ref 11–307)

## 2017-05-31 LAB — VITAMIN B12: VITAMIN B 12: 1066 pg/mL — AB (ref 180–914)

## 2017-05-31 NOTE — Patient Instructions (Addendum)
[facility] at [facility] Discharge Instructions  RECOMMENDATIONS MADE BY THE CONSULTANT AND ANY TEST RESULTS WILL BE SENT TO YOUR REFERRING PHYSICIAN.  You were seen today by Lubertha BasqueGretchen Dawson NP. Constipation sheet given.  Return in 3 months for labs and follow up.  Thank you for choosing [facility] at [facility] to provide your oncology and hematology care.  To afford each patient quality time with our provider, please arrive at least 15 minutes before your scheduled appointment time.    If you have a lab appointment with the Cancer Center please come in thru the  Main Entrance and check in at the main information desk  You need to re-schedule your appointment should you arrive 10 or more minutes late.  We strive to give you quality time with our providers, and arriving late affects you and other patients whose appointments are after yours.  Also, if you no show three or more times for appointments you may be dismissed from the clinic at the providers discretion.     Again, thank you for choosing [facility].  Our hope is that these requests will decrease the amount of time that you wait before being seen by our physicians.       _____________________________________________________________  Should you have questions after your visit to [facility], please contact our office at [phone] between the hours of 8:30 a.m. and 4:30 p.m.  Voicemails left after 4:30 p.m. will not be returned until the following business day.  For prescription refill requests, have your pharmacy contact our office.       Resources For Cancer Patients and their Caregivers ? American Cancer Society: Can assist with transportation, wigs, general needs, runs Look Good Feel Better.        717-281-23931-(534)810-4248 ? Cancer Care: Provides financial assistance, online support groups, medication/co-pay assistance.  1-800-813-HOPE 508-212-2229(4673) ? [facility] Assists [place] cancer patients and their families through emotional , educational and financial support.  [phone] ? [place] DSS Where to apply for food stamps, Medicaid and utility assistance. (786)864-9872(819) 202-4487 ? [facility]: Transportation to medical appointments. 410 003 0838(804)735-5290 ? Social Security Administration: May apply for disability if have a Stage IV cancer. [phone] [phone] ? [place] [facility] and [facility]: Assists with nutrition, care and transit needs. 608 [phone]  Cancer Center Support Programs: @10RELATIVEDAYS @ > Cancer Support Group  2nd Tuesday of the month 1pm-2pm, Journey Room  > Creative Journey  3rd Tuesday of the month 1130am-1pm, Journey Room  > Look Good Feel Better  1st Wednesday of the month 10am-12 noon, Journey Room (Call American Cancer Society to register [phone])

## 2017-06-01 ENCOUNTER — Telehealth: Payer: Self-pay | Admitting: Nurse Practitioner

## 2017-06-01 NOTE — Telephone Encounter (Signed)
RX sent into [facility] on 05/29/2017 Confirmed with [facility] Left message on VM for pt

## 2017-06-03 NOTE — Progress Notes (Signed)
Jamie Cochran [address], [place] [address]   CLINIC:  Medical Oncology/Hematology  PCP:  Bennie Pierini, FNP [address] [place] [address] [phone]   REASON FOR VISIT:  Follow-up for Anemia AND B12 deficiency  CURRENT THERAPY: IV iron prn AND sublingual vitamin B12    INTERVAL HISTORY:  Jamie Cochran 83 y.o. female returns for follow-up for anemia.   She is here today with her granddaughter, who is her primary caregiver; patient lives with her granddaughter. Jamie Cochran has severe hearing loss and dementia, making it difficult for her to answer questions; her granddaughter answers most of the questions during today's visit.   Appetite is 100%; Energy levels 50%. Granddaughter states that she has been sleeping most of the day. "We wake her up for breakfast and to take her medication, she eats and goes back to bed. She does the same thing at lunch and at dinner. I can't get her to do anything else; she just wants to sleep all the time"    Denies any bleeding episodes including blood in her stools, dark/tarry stools, hematuria, vaginal bleeding, nosebleeds, or gingival bleeding.    The following information obtained from patient's granddaughter:  -Recurrent UTIs; most recently diagnosed with UTI and plans to start antibiotics today.  Progressive dementia with behavioral changes; granddaughter concerned that she is having more difficulty following commands and performing self-care/hygiene appropriately.  -Patient also has been chewing her medications, even with applesauce (which was previously effective in helping patient swallow her meds per granddaughter).  States that Jamie Cochran also occasionally states that she cannot swallow well; she has required esophageal dilation in the past.  Notes that her swallowing has been "bad off and on since she had all those strokes."  -Constipation; patient not consistently moving bowels well.    *Her granddaughter asks for help in different strategies to help her take her medications and possible ways to reduce patient's risk for recurrent UTIs.     REVIEW OF SYSTEMS: (per HPI: completed by granddaughter/her caregiver, given patient's progressive dementia and hard of hearing).  Review of Systems  Unable to perform ROS: Dementia     PAST MEDICAL/SURGICAL HISTORY:  Past Medical History:  Diagnosis Date  . CHF (congestive heart failure) (HCC)   . Dementia   . Diabetes type 2, uncontrolled (HCC)   . Hearing loss    history of right sided cholesteotoma s/p Right tympanoplasty with ossicular reconstruction. (03/2002)  . Hyperlipidemia   . Hypertension   . Hypothyroidism   . Iron deficiency anemia 02/05/2017  . Non-occlusive coronary artery disease    40% first diagonal stenosis, 50% obtuse marginal stenosis  . Osteoporosis   . Stroke (HCC) in 05/2012, ?   deep right frontal perventricular deep white matter infarct + subacute left thalamic lacunar infarct  . Urinary incontinence    s/p surgery for ureterocele  . UTI (lower urinary tract infection) 09/2015   granddaughter reports "constant UTIs"   Past Surgical History:  Procedure Laterality Date  . ABDOMINAL HYSTERECTOMY    . BLADDER REPAIR    . CESAREAN SECTION    . EYE SURGERY    . INNER EAR SURGERY    . TONSILLECTOMY AND ADENOIDECTOMY       SOCIAL HISTORY:  Social History   Social History  . Marital status: Widowed    Spouse name: N/A  . Number of children: N/A  . Years of education: N/A   Occupational History  . retired  Social History Main Topics  . Smoking status: Never Smoker  . Smokeless tobacco: Never Used  . Alcohol use No  . Drug use: No  . Sexual activity: No   Other Topics Concern  . Not on file   Social History Narrative   [place]   //       [place]:   Bathing: Yes   Dressing: Yes   Toileting: Yes   Maintaining continence: No    Grooming: Yes   Feeding: Yes   Transferring: Yes         // [place] with [place]:    Shopping for groceries: Yes    Driving or using public transportation: N/A   Using the telephone: Yes   Performing housework: Yes   Doing home repair: Yes   Doing laundry:  Yes   Taking medications: Yes   Handling finances: Yes                   FAMILY HISTORY:  Family History  Problem Relation Age of Onset  . Heart disease Father     CURRENT MEDICATIONS:  Outpatient Encounter Prescriptions as of 05/31/2017  Medication Sig  . amLODipine (NORVASC) 5 MG tablet Take 1 tablet (5 mg total) by mouth daily. TAKE ONE TABLET BY MOUTH ONCE DAILY MUST  BE  SEEN  FOR  ADDITIONAL  REFILLS  . aspirin 81 MG tablet Take 81 mg by mouth daily.  . carvedilol (COREG) 25 MG tablet Take 1 tablet (25 mg total) by mouth 2 (two) times daily with a meal.  . Cholecalciferol 2000 units TABS Take 1 tablet by mouth daily.  Marland Kitchen. CINNAMON PO Take 1 tablet by mouth 2 (two) times daily.  . citalopram (CELEXA) 20 MG tablet Take 1 tablet (20 mg total) by mouth daily.  . Cyanocobalamin (B-12) 1000 MCG SUBL Place 1,000 mcg under the tongue daily.  Marland Kitchen. doxycycline (VIBRA-TABS) 100 MG tablet Take 1 tablet (100 mg total) by mouth 2 (two) times daily. 1 po bid  . Fe Fum-FA-B Cmp-C-Zn-Mg-Mn-Cu (HEMOCYTE PLUS) 106-1 MG CAPS Take 1 tablet by mouth daily.  . fenofibrate (TRICOR) 145 MG tablet Take 1 tablet (145 mg total) by mouth daily.  . furosemide (LASIX) 40 MG tablet Take 1 tablet (40 mg total) by mouth 2 (two) times daily.  . Insulin Glargine (LANTUS SOLOSTAR) 100 UNIT/ML Solostar Pen Inject 24 Units into the skin daily at 10 pm.  . levothyroxine (SYNTHROID, LEVOTHROID) 75 MCG tablet Take 1 tablet (75 mcg total) by mouth daily.  Marland Kitchen. lisinopril (PRINIVIL,ZESTRIL) 20 MG tablet Take 1 tablet (20 mg total) by mouth daily.  . meloxicam (MOBIC) 15 MG tablet Take 1 tablet (15 mg total) by mouth daily.  Marland Kitchen. omeprazole (PRILOSEC) 40 MG capsule  Take 1 capsule (40 mg total) by mouth daily.  Marland Kitchen. oxybutynin (DITROPAN XL) 15 MG 24 hr tablet Take 1 tablet (15 mg total) by mouth at bedtime.  . potassium chloride (K-DUR) 10 MEQ tablet Take 2 tablets (20 mEq total) by mouth daily.  Marland Kitchen. donepezil (ARICEPT) 10 MG tablet Take 1 tablet (10 mg total) by mouth at bedtime. (Patient not taking: Reported on 05/31/2017)  . memantine (NAMENDA) 10 MG tablet Take 1 tablet (10 mg total) by mouth 2 (two) times daily. (Patient not taking: Reported on 05/31/2017)   No facility-administered encounter medications on file as of 05/31/2017.     ALLERGIES:  Allergies  Allergen Reactions  . Sulfamethoxazole Swelling  .  Sulfa Antibiotics Rash     PHYSICAL EXAM:  ECOG Performance status: 3 - Symptomatic; requires significant assistance.   Vitals:   05/31/17 1520  BP: (!) 157/54  Pulse: 63  Resp: 16   Filed Weights   05/31/17 1520  Weight: 122 lb (55.3 kg)    Physical Exam  Constitutional:  Chronically ill-appearing female in no acute distress -Assisted patient onto exam table   HENT:  Head: Normocephalic.  Mouth/Throat: Oropharynx is clear and moist. No oropharyngeal exudate.  Hard of hearing   Eyes: Pupils are equal, round, and reactive to light. Conjunctivae are normal. No scleral icterus.  Neck: Normal range of motion. Neck supple.  Cardiovascular: Normal rate and regular rhythm.   Pulmonary/Chest: Effort normal and breath sounds normal. No respiratory distress. She has no wheezes.  Abdominal: Soft. Bowel sounds are normal. There is no tenderness.  Musculoskeletal: Normal range of motion. She exhibits edema (Trace BLE edema).  Lymphadenopathy:    She has no cervical adenopathy.  Neurological: She is alert.  Skin: Skin is warm and dry. No rash noted.  Psychiatric:  Mildly depressed affect Dementia apparent   Nursing note and vitals reviewed.    LABORATORY DATA:  I have reviewed the labs as listed.  CBC    Component Value Date/Time    WBC 8.2 05/31/2017 1451   RBC 3.67 (L) 05/31/2017 1451   HGB 12.2 05/31/2017 1451   HGB 7.7 (<) 01/10/2017 1234   HCT 35.9 (L) 05/31/2017 1451   HCT 32.4 (L) 01/29/2017 0926   PLT 135 (L) 05/31/2017 1451   PLT 209 01/10/2017 1234   MCV 97.8 05/31/2017 1451   MCV 92 01/10/2017 1234   MCH 33.2 05/31/2017 1451   MCHC 34.0 05/31/2017 1451   RDW 12.8 05/31/2017 1451   RDW 15.0 01/10/2017 1234   LYMPHSABS 3.1 05/31/2017 1451   LYMPHSABS 2.1 01/10/2017 1234   MONOABS 0.7 05/31/2017 1451   EOSABS 0.1 05/31/2017 1451   EOSABS 0.1 01/10/2017 1234   BASOSABS 0.0 05/31/2017 1451   BASOSABS 0.0 01/10/2017 1234   CMP Latest Ref Rng & Units 05/31/2017 05/29/2017 01/29/2017  Glucose 65 - 99 mg/dL 95 604(V) 409(W)  BUN 6 - 20 mg/dL 11(B) 14(N) 82(N)  Creatinine 0.44 - 1.00 mg/dL 5.62(Z) 3.08(M) 5.78(I)  Sodium 135 - 145 mmol/L 140 146(H) 139  Potassium 3.5 - 5.1 mmol/L 4.0 3.9 3.6  Chloride 101 - 111 mmol/L 102 103 102  CO2 22 - 32 mmol/L 30 25 31   Calcium 8.9 - 10.3 mg/dL 9.3 9.2 9.3  Total Protein 6.5 - 8.1 g/dL 6.5 6.2 7.0  Total Bilirubin 0.3 - 1.2 mg/dL 0.7 0.3 0.4  Alkaline Phos 38 - 126 U/L 35(L) 41 37(L)  AST 15 - 41 U/L 22 18 18   ALT 14 - 54 U/L 12(L) 10 9(L)    PENDING LABS:    DIAGNOSTIC IMAGING:    PATHOLOGY:     ASSESSMENT & PLAN:   Anemia:  -Likely d/t iron deficiency, anemia of chronic disease & CKD.  -Receives intermittent IV iron; last dose  Oncology Flowsheet 02/15/2017  ferumoxytol [facility]) IV 510 mg  -Iron studies pending for today; will make arrangements for additional IV Iron if needed.  -Hgb normal today at 12.2 g/dL. She has had elevated kappa/lambda light chains and ratio in the past, without M-spike; IFE also normal in 01/2017.  Likely d/t CKD.  -Return to cancer center in 3 months with labs.   Mild thrombocytopenia:  -Platelets mildly low  at 135,000. No active bleeding; she does have easy bruising. Will keep monitoring.   Low vitamin B12:    -Continue sublingual vitamin B12 supplementation.   Progressive dementia:  -Much of today's visit was spent allowing the patient's granddaughter to discuss her concerns regarding Jamie Cochran progressive dementia and strategies to help improve the way she is able to care for her grandmother. She has stopped dementia medications, because reports that "they made her so much worse."  Shared with granddaughter that she may need to consider placement in a facility that specializes in memory care; she is currently not interested in this option.    *Recurrent UTIs: Granddaughter has been encouraging patient to use wet wipes with bowel movements, but she does not do so consistently.  Discussed with her that recurrent UTIs in older women are common. Her hygiene (and behaviors making it difficult for her perineal area to remain clean) may be contributing. Periodic urinary incontinence may be contributing as well.  Patient does drink cranberry juice daily; encouraged her to continue to do so, or have her take cranberry tablets. Reinforced for patient's granddaughter that any infection, like UTIs, can make dementia symptoms worse.  Encouraged her to have patient force fluids, which is also difficult.    *Constipation: Education sheet given to patient's granddaughter on OTC ways to help manage constipation at home.   *Medication management/Possible dysphagia: Recommended that patient's granddaughter crush medications and put them in applesauce. I will have my nurse given them a list of her medications that can be crushed. If her symptoms continue with dysphagia, then recommended she follow-up with GI for possible EGD/esophageal dilation.     Dispo:  -Return to cancer center in 3 months with labs.    All questions were answered to patient's stated satisfaction. Encouraged patient to call with any new concerns or questions before her next visit to the cancer center and we can certain see her sooner, if needed.     Plan of care discussed with Dr. Janyth Contes, who agrees with the above aforementioned.   A total of 30 minutes was spent in face-to-face care of this patient, with greater than 50% of that time spent in counseling and care-coordination.     Orders placed this encounter:  Orders Placed This Encounter  Procedures  . CBC with Differential/Platelet  . Comprehensive metabolic panel  . Vitamin B12  . Folate  . Iron and TIBC  . Ferritin      Lubertha Basque, NP [facility] [phone]

## 2017-06-05 ENCOUNTER — Telehealth: Payer: Self-pay

## 2017-06-05 NOTE — Telephone Encounter (Signed)
Insurance denied Oxybutynin Chloride ER for prior authorization

## 2017-06-05 NOTE — Telephone Encounter (Signed)
What will they pay for? 

## 2017-06-11 ENCOUNTER — Other Ambulatory Visit: Payer: Self-pay | Admitting: Pharmacist

## 2017-06-23 ENCOUNTER — Emergency Department (HOSPITAL_COMMUNITY)
Admission: EM | Admit: 2017-06-23 | Discharge: 2017-06-24 | Disposition: A | Discharge status: Home / Self Care | Payer: Medicare Other | Attending: Emergency Medicine | Admitting: Emergency Medicine

## 2017-06-23 ENCOUNTER — Emergency Department (HOSPITAL_COMMUNITY): Payer: Medicare Other

## 2017-06-23 ENCOUNTER — Encounter (HOSPITAL_COMMUNITY): Payer: Self-pay | Admitting: Emergency Medicine

## 2017-06-23 DIAGNOSIS — Z794 Long term (current) use of insulin: Secondary | ICD-10-CM | POA: Diagnosis not present

## 2017-06-23 DIAGNOSIS — Z79899 Other long term (current) drug therapy: Secondary | ICD-10-CM | POA: Diagnosis not present

## 2017-06-23 DIAGNOSIS — E119 Type 2 diabetes mellitus without complications: Secondary | ICD-10-CM | POA: Insufficient documentation

## 2017-06-23 DIAGNOSIS — I5032 Chronic diastolic (congestive) heart failure: Secondary | ICD-10-CM | POA: Diagnosis not present

## 2017-06-23 DIAGNOSIS — E039 Hypothyroidism, unspecified: Secondary | ICD-10-CM | POA: Diagnosis not present

## 2017-06-23 DIAGNOSIS — Z7982 Long term (current) use of aspirin: Secondary | ICD-10-CM | POA: Insufficient documentation

## 2017-06-23 DIAGNOSIS — I110 Hypertensive heart disease with heart failure: Secondary | ICD-10-CM | POA: Diagnosis not present

## 2017-06-23 DIAGNOSIS — N3000 Acute cystitis without hematuria: Secondary | ICD-10-CM | POA: Insufficient documentation

## 2017-06-23 DIAGNOSIS — R410 Disorientation, unspecified: Secondary | ICD-10-CM | POA: Diagnosis present

## 2017-06-23 LAB — URINALYSIS, ROUTINE W REFLEX MICROSCOPIC
Bilirubin Urine: NEGATIVE
GLUCOSE, UA: NEGATIVE mg/dL
HGB URINE DIPSTICK: NEGATIVE
Ketones, ur: NEGATIVE mg/dL
NITRITE: NEGATIVE
Protein, ur: NEGATIVE mg/dL
SPECIFIC GRAVITY, URINE: 1.01 (ref 1.005–1.030)
pH: 6 (ref 5.0–8.0)

## 2017-06-23 LAB — CBC WITH DIFFERENTIAL/PLATELET
BASOS PCT: 0 %
Basophils Absolute: 0 10*3/uL (ref 0.0–0.1)
EOS ABS: 0.1 10*3/uL (ref 0.0–0.7)
Eosinophils Relative: 2 %
HCT: 33.6 % — ABNORMAL LOW (ref 36.0–46.0)
HEMOGLOBIN: 11.3 g/dL — AB (ref 12.0–15.0)
LYMPHS PCT: 49 %
Lymphs Abs: 2.8 10*3/uL (ref 0.7–4.0)
MCH: 33 pg (ref 26.0–34.0)
MCHC: 33.6 g/dL (ref 30.0–36.0)
MCV: 98.2 fL (ref 78.0–100.0)
MONO ABS: 0.4 10*3/uL (ref 0.1–1.0)
MONOS PCT: 7 %
NEUTROS ABS: 2.4 10*3/uL (ref 1.7–7.7)
Neutrophils Relative %: 42 %
Platelets: 111 10*3/uL — ABNORMAL LOW (ref 150–400)
RBC: 3.42 MIL/uL — ABNORMAL LOW (ref 3.87–5.11)
RDW: 12.9 % (ref 11.5–15.5)
WBC: 5.8 10*3/uL (ref 4.0–10.5)

## 2017-06-23 LAB — COMPREHENSIVE METABOLIC PANEL
ALBUMIN: 3.6 g/dL (ref 3.5–5.0)
ALT: 14 U/L (ref 14–54)
AST: 24 U/L (ref 15–41)
Alkaline Phosphatase: 44 U/L (ref 38–126)
Anion gap: 8 (ref 5–15)
BUN: 37 mg/dL — AB (ref 6–20)
CALCIUM: 9.3 mg/dL (ref 8.9–10.3)
CO2: 31 mmol/L (ref 22–32)
CREATININE: 1.7 mg/dL — AB (ref 0.44–1.00)
Chloride: 103 mmol/L (ref 101–111)
GFR, EST AFRICAN AMERICAN: 31 mL/min — AB (ref >60)
GFR, EST NON AFRICAN AMERICAN: 26 mL/min — AB (ref >60)
Glucose, Bld: 102 mg/dL — ABNORMAL HIGH (ref 65–99)
Potassium: 3.8 mmol/L (ref 3.5–5.1)
SODIUM: 142 mmol/L (ref 135–145)
Total Bilirubin: 0.6 mg/dL (ref 0.3–1.2)
Total Protein: 6.4 g/dL — ABNORMAL LOW (ref 6.5–8.1)

## 2017-06-23 LAB — CBG MONITORING, ED: GLUCOSE-CAPILLARY: 102 mg/dL — AB (ref 65–99)

## 2017-06-23 MED ORDER — CEPHALEXIN 250 MG/5ML PO SUSR
500.0000 mg | Freq: Once | ORAL | Status: AC
  Administered 2017-06-24: 500 mg via ORAL
  Filled 2017-06-23: qty 20

## 2017-06-23 NOTE — ED Triage Notes (Signed)
Increased confusion x 1 week.  Family member states she has chronic uti's.  Finished abx x 1 week ago.  Would like her checked for uti again

## 2017-06-23 NOTE — ED Provider Notes (Signed)
[facility] DEPT Provider Note   CSN: [id number] Arrival date & time: 06/23/17  2047     History   Chief Complaint Chief Complaint  Patient presents with  . Altered Mental Status    HPI Jamie Cochran is a 83 y.o. female with a history of dementia, CHF, DM, HTN, CAD, cva and frequent uti's presenting with altered mental status which her granddaughter caregiver noticed slowly worsening this past week. She completed a 10 day course of doxycycline last week for a uti but the granddaughter suspects it is not resolved.  At baseline, the patient requires assistance with ADL's and is incontinent of urine and stool and has moderate dementia.  Today she started talking about wanting to "eat her feces" which is very bizarre for her so granddaughter decided she needed to get rechecked.  She has had no noticed fevers, no vomiting, no focal weakness, she is ambulatory without assistance at baseline and that is not changed today.  Her appetite as been normal.  Has a dry sounding cough which is chronic. The patient denies any complaint of pain.  The history is provided by the patient.  Altered Mental Status      Past Medical History:  Diagnosis Date  . CHF (congestive heart failure) (HCC)   . Dementia   . Diabetes type 2, uncontrolled (HCC)   . Hearing loss    history of right sided cholesteotoma s/p Right tympanoplasty with ossicular reconstruction. (03/2002)  . Hyperlipidemia   . Hypertension   . Hypothyroidism   . Iron deficiency anemia 02/05/2017  . Non-occlusive coronary artery disease    40% first diagonal stenosis, 50% obtuse marginal stenosis  . Osteoporosis   . Stroke (HCC) in 05/2012, ?   deep right frontal perventricular deep white matter infarct + subacute left thalamic lacunar infarct  . Urinary incontinence    s/p surgery for ureterocele  . UTI (lower urinary tract infection) 09/2015   granddaughter reports "constant UTIs"    Patient Active Problem List   Diagnosis Date Noted  . Iron deficiency anemia 02/05/2017  . Thrombocytopenia (HCC) 01/01/2017  . Cervical neck pain with evidence of disc disease 05/30/2016  . Weakness generalized 05/30/2016  . Dementia 05/30/2016  . Depression 01/03/2016  . Hypokalemia 01/03/2016  . Chronic diastolic CHF (congestive heart failure) (HCC) 09/18/2015  . Dysphagia, pharyngoesophageal phase 01/06/2015  . Diabetes mellitus type 2, controlled (HCC) 01/05/2015  . Right sided weakness 05/28/2012  . Hypertension 02/02/2011  . Osteoporosis 02/02/2011  . Hyperlipidemia 02/02/2011  . Hypothyroidism 02/02/2011  . Stress incontinence, female 02/02/2011  . Osteoarthritis 02/02/2011    Past Surgical History:  Procedure Laterality Date  . ABDOMINAL HYSTERECTOMY    . BLADDER REPAIR    . CESAREAN SECTION    . EYE SURGERY    . INNER EAR SURGERY    . TONSILLECTOMY AND ADENOIDECTOMY      OB History    No data available       Home Medications    Prior to Admission medications   Medication Sig Start Date End Date Taking? Authorizing Provider  carvedilol (COREG) 25 MG tablet Take 1 tablet (25 mg total) by mouth 2 (two) times daily with a meal. 05/29/17  Yes Martin, Mary-Margaret, FNP  Cyanocobalamin (B-12) 1000 MCG SUBL Place 1,000 mcg under the tongue daily. 02/05/17  Yes Kefalas, Maurine Minister, PA-C  amLODipine (NORVASC) 5 MG tablet Take 1 tablet (5 mg total) by mouth daily. TAKE ONE TABLET BY MOUTH ONCE DAILY MUST  BE  SEEN  FOR  ADDITIONAL  REFILLS 05/29/17   Bennie PieriniMartin, Mary-Margaret, FNP  aspirin 81 MG tablet Take 81 mg by mouth daily.    [provider]  cephALEXin (KEFLEX) 250 MG/5ML suspension Take 10 mLs (500 mg total) by mouth 2 (two) times daily. 06/24/17 07/01/17  Burgess AmorIdol, Kaizlee Carlino, PA-C  Cholecalciferol 2000 units TABS Take 1 tablet by mouth daily.    [provider]  CINNAMON PO Take 1 tablet by mouth 2 (two) times daily.    [provider]  citalopram (CELEXA) 20 MG tablet Take 1 tablet  (20 mg total) by mouth daily. 05/29/17   Daphine DeutscherMartin, Mary-Margaret, FNP  donepezil (ARICEPT) 10 MG tablet Take 1 tablet (10 mg total) by mouth at bedtime. Patient not taking: Reported on 05/31/2017 05/29/17   Bennie PieriniMartin, Mary-Margaret, FNP  doxycycline (VIBRA-TABS) 100 MG tablet Take 1 tablet (100 mg total) by mouth 2 (two) times daily. 1 po bid 05/29/17   Daphine DeutscherMartin, Mary-Margaret, FNP  Fe Fum-FA-B Cmp-C-Zn-Mg-Mn-Cu (HEMOCYTE PLUS) 106-1 MG CAPS Take 1 tablet by mouth daily. 05/29/17   Daphine DeutscherMartin, Mary-Margaret, FNP  fenofibrate (TRICOR) 145 MG tablet Take 1 tablet (145 mg total) by mouth daily. 05/29/17   Daphine DeutscherMartin, Mary-Margaret, FNP  furosemide (LASIX) 40 MG tablet Take 1 tablet (40 mg total) by mouth 2 (two) times daily. 05/29/17   Daphine DeutscherMartin, Mary-Margaret, FNP  Insulin Glargine (LANTUS SOLOSTAR) 100 UNIT/ML Solostar Pen Inject 24 Units into the skin daily at 10 pm. 05/29/17   Daphine DeutscherMartin, Mary-Margaret, FNP  levothyroxine (SYNTHROID, LEVOTHROID) 75 MCG tablet Take 1 tablet (75 mcg total) by mouth daily. 05/29/17   Daphine DeutscherMartin, Mary-Margaret, FNP  lisinopril (PRINIVIL,ZESTRIL) 20 MG tablet Take 1 tablet (20 mg total) by mouth daily. 05/29/17   Daphine DeutscherMartin, Mary-Margaret, FNP  meloxicam (MOBIC) 15 MG tablet Take 1 tablet (15 mg total) by mouth daily. 05/29/17   Daphine DeutscherMartin, Mary-Margaret, FNP  memantine (NAMENDA) 10 MG tablet Take 1 tablet (10 mg total) by mouth 2 (two) times daily. Patient not taking: Reported on 05/31/2017 05/29/17   Bennie PieriniMartin, Mary-Margaret, FNP  NOVOLIN N RELION 100 UNIT/ML injection INJECT 24 UNITS INTO THE SKIN ONCE DAILY BEFORE BREAKFAST 06/12/17   Daphine DeutscherMartin, Mary-Margaret, FNP  omeprazole (PRILOSEC) 40 MG capsule Take 1 capsule (40 mg total) by mouth daily. 05/29/17   Daphine DeutscherMartin, Mary-Margaret, FNP  oxybutynin (DITROPAN XL) 15 MG 24 hr tablet Take 1 tablet (15 mg total) by mouth at bedtime. 05/29/17   Daphine DeutscherMartin, Mary-Margaret, FNP  potassium chloride (K-DUR) 10 MEQ tablet Take 2 tablets (20 mEq total) by mouth daily. 05/29/17   Bennie PieriniMartin,  Mary-Margaret, FNP    Family History Family History  Problem Relation Age of Onset  . Heart disease Father     Social History Social History  Substance Use Topics  . Smoking status: Never Smoker  . Smokeless tobacco: Never Used  . Alcohol use No     Allergies   Sulfamethoxazole and Sulfa antibiotics   Review of Systems Review of Systems  Unable to perform ROS: Dementia     Physical Exam Updated Vital Signs BP (!) 169/64 (BP Location: Right Arm)   Pulse 60   Temp 98.4 F (36.9 C) (Oral)   Resp 19   Ht 4\' 8"  (1.422 m)   Wt 55.3 kg (122 lb)   LMP 02/04/1963   SpO2 97%   BMI 27.35 kg/m   Physical Exam  Constitutional: She appears well-developed and well-nourished.  Pleasant patient with dementia, very HOH, no complaints of pain.  HENT:  Head: Normocephalic and atraumatic.  Eyes: Conjunctivae are normal.  Neck: Normal range of motion.  Cardiovascular: Normal rate, regular rhythm, normal heart sounds and intact distal pulses.   Pulmonary/Chest: Effort normal and breath sounds normal. No respiratory distress. She has no wheezes.  Abdominal: Soft. Bowel sounds are normal. She exhibits distension. She exhibits no mass. There is no tenderness. There is no rebound and no guarding.  No increased tympany  Musculoskeletal: Normal range of motion.  Neurological: She is alert.  Skin: Skin is warm and dry. No rash noted.  Psychiatric: She has a normal mood and affect. Cognition and memory are impaired. She is inattentive.  Nursing note and vitals reviewed.    ED Treatments / Results  Labs (all labs ordered are listed, but only abnormal results are displayed)  Results for orders placed or performed during the hospital encounter of 06/23/17  Urinalysis, Routine w reflex microscopic  Result Value Ref Range   Color, Urine YELLOW YELLOW   APPearance HAZY (A) CLEAR   Specific Gravity, Urine 1.010 1.005 - 1.030   pH 6.0 5.0 - 8.0   Glucose, UA NEGATIVE NEGATIVE mg/dL     Hgb urine dipstick NEGATIVE NEGATIVE   Bilirubin Urine NEGATIVE NEGATIVE   Ketones, ur NEGATIVE NEGATIVE mg/dL   Protein, ur NEGATIVE NEGATIVE mg/dL   Nitrite NEGATIVE NEGATIVE   Leukocytes, UA LARGE (A) NEGATIVE   RBC / HPF 0-5 0 - 5 RBC/hpf   WBC, UA TOO NUMEROUS TO COUNT 0 - 5 WBC/hpf   Bacteria, UA RARE (A) NONE SEEN   Squamous Epithelial / LPF 0-5 (A) NONE SEEN   WBC Clumps PRESENT   CBC with Differential  Result Value Ref Range   WBC 5.8 4.0 - 10.5 K/uL   RBC 3.42 (L) 3.87 - 5.11 MIL/uL   Hemoglobin 11.3 (L) 12.0 - 15.0 g/dL   HCT 08.6 (L) 57.8 - 46.9 %   MCV 98.2 78.0 - 100.0 fL   MCH 33.0 26.0 - 34.0 pg   MCHC 33.6 30.0 - 36.0 g/dL   RDW 62.9 52.8 - 41.3 %   Platelets 111 (L) 150 - 400 K/uL   Neutrophils Relative % 42 %   Neutro Abs 2.4 1.7 - 7.7 K/uL   Lymphocytes Relative 49 %   Lymphs Abs 2.8 0.7 - 4.0 K/uL   Monocytes Relative 7 %   Monocytes Absolute 0.4 0.1 - 1.0 K/uL   Eosinophils Relative 2 %   Eosinophils Absolute 0.1 0.0 - 0.7 K/uL   Basophils Relative 0 %   Basophils Absolute 0.0 0.0 - 0.1 K/uL  Comprehensive metabolic panel  Result Value Ref Range   Sodium 142 135 - 145 mmol/L   Potassium 3.8 3.5 - 5.1 mmol/L   Chloride 103 101 - 111 mmol/L   CO2 31 22 - 32 mmol/L   Glucose, Bld 102 (H) 65 - 99 mg/dL   BUN 37 (H) 6 - 20 mg/dL   Creatinine, Ser 2.44 (H) 0.44 - 1.00 mg/dL   Calcium 9.3 8.9 - 01.0 mg/dL   Total Protein 6.4 (L) 6.5 - 8.1 g/dL   Albumin 3.6 3.5 - 5.0 g/dL   AST 24 15 - 41 U/L   ALT 14 14 - 54 U/L   Alkaline Phosphatase 44 38 - 126 U/L   Total Bilirubin 0.6 0.3 - 1.2 mg/dL   GFR calc non Af Amer 26 (L) >60 mL/min   GFR calc Af Amer 31 (L) >60 mL/min   Anion  gap 8 5 - 15  POC CBG, ED  Result Value Ref Range   Glucose-Capillary 102 (H) 65 - 99 mg/dL   Dg Chest 2 View  Result Date: 06/23/2017 CLINICAL DATA:  Altered mental status EXAM: CHEST  2 VIEW COMPARISON:  01/03/2017 FINDINGS: Stable cardiomegaly with mild aortic  atherosclerosis. Mild central vascular congestion is again seen though slightly decreased in appearance and with improved aeration of the lungs since prior. No pneumonic consolidation is identified. Stable trace pleural effusions left greater than right. Degenerative changes are seen along the included thoracic spine and both shoulders. IMPRESSION: Stable cardiomegaly with aortic atherosclerosis and mild central vascular congestion. Tiny bilateral pleural effusions. Electronically Signed   By: Tollie Eth M.D.   On: 06/23/2017 23:22     EKG  EKG Interpretation None       Radiology Dg Chest 2 View  Result Date: 06/23/2017 CLINICAL DATA:  Altered mental status EXAM: CHEST  2 VIEW COMPARISON:  01/03/2017 FINDINGS: Stable cardiomegaly with mild aortic atherosclerosis. Mild central vascular congestion is again seen though slightly decreased in appearance and with improved aeration of the lungs since prior. No pneumonic consolidation is identified. Stable trace pleural effusions left greater than right. Degenerative changes are seen along the included thoracic spine and both shoulders. IMPRESSION: Stable cardiomegaly with aortic atherosclerosis and mild central vascular congestion. Tiny bilateral pleural effusions. Electronically Signed   By: Tollie Eth M.D.   On: 06/23/2017 23:22    Procedures Procedures (including critical care time)  Medications Ordered in ED Medications  cephALEXin (KEFLEX) 250 MG/5ML suspension 500 mg (500 mg Oral Given 06/24/17 0016)     Initial Impression / Assessment and Plan / ED Course  I have reviewed the triage vital signs and the nursing notes.  Pertinent labs & imaging results that were available during my care of the patient were reviewed by me and considered in my medical decision making (see chart for details).     Labs reviewed and stable, urine was a cath specimen with TNTC wbc's and wbc clumps, cloudy appearance, suspect this is ongoing uti.  Keflex  started, culture sent.  Advised recheck by pcp or return here for any worsened or new sx.    Final Clinical Impressions(s) / ED Diagnoses   Final diagnoses:  Acute cystitis without hematuria    New Prescriptions New Prescriptions   CEPHALEXIN (KEFLEX) 250 MG/5ML SUSPENSION    Take 10 mLs (500 mg total) by mouth 2 (two) times daily.     Burgess Amor, PA-C 06/24/17 Lorenda Cahill    Bethann Berkshire, MD 06/25/17 1336

## 2017-06-23 NOTE — ED Notes (Signed)
Pt attempted to provide urine sample with female urinal. Pt unable to. Pt and pt's family member express desire to have in and out catheter done on pt

## 2017-06-24 MED ORDER — CEPHALEXIN 250 MG/5ML PO SUSR: 500.0000 mg | Freq: Two times a day (BID) | ORAL | 0 refills | Status: AC

## 2017-06-26 LAB — URINE CULTURE

## 2017-06-27 ENCOUNTER — Telehealth: Payer: Self-pay | Admitting: *Deleted

## 2017-06-27 NOTE — Telephone Encounter (Signed)
Post ED Visit - Positive Culture Follow-up: Unsuccessful Patient Follow-up  Culture assessed and recommendations reviewed by:  []  Enzo BiNathan Batchelder, Pharm.D. []  Celedonio MiyamotoJeremy Frens, Pharm.D., BCPS AQ-ID []  Garvin FilaMike Maccia, Pharm.D., BCPS []  Georgina PillionElizabeth Martin, [address]Pharm.D., BCPS []  RosenbergMinh Pham, Vermont[place]arm.D., BCPS, AAHIVP []  Estella HuskMichelle Turner, Pharm.D., BCPS, AAHIVP []  Lysle Pearlachel Rumbarger, PharmD, BCPS []  Casilda Carlsaylor Stone, PharmD, BCPS []  Pollyann SamplesAndy Johnston, PharmD, BCPS  Positive urine culture, reviewed by Dietrich PatesHina Khatri PA-C recommends stop Cephalexin, asymptomatic bacturia, no further treatment necessary  []  Patient discharged without antimicrobial prescription and treatment is now indicated []  Organism is resistant to prescribed ED discharge antimicrobial []  Patient with positive blood cultures   Unable to contact patient after 3 attempts, letter will be sent to address on file  Lysle PearlRobertson, Nickolai Rinks Talley 06/27/2017, 9:54 AM

## 2017-06-27 NOTE — Progress Notes (Signed)
ED Antimicrobial Stewardship Positive Culture Follow Up   Jamie Cochran is an 83 y.o. female [facility] with a chief complaint of  Chief Complaint  Patient presents with  . Altered Mental Status    Recent Results (from the past 720 hour(s))  Microscopic Examination     Status: Abnormal   Collection Time: 05/29/17  2:56 PM  Result Value Ref Range Status   WBC, UA >30 (A) 0 - 5 /hpf Final   RBC, UA >30 (A) 0 - 2 /hpf Final   Epithelial Cells (non renal) 0-10 0 - 10 /hpf Final   Renal Epithel, UA 0-10 (A) None seen /hpf Final   Bacteria, UA Many (A) None seen/Few Final  Urine Culture     Status: Abnormal   Collection Time: 06/23/17 10:27 PM  Result Value Ref Range Status   Specimen Description URINE, CATHETERIZED  Final   Special Requests NONE  Final   Culture >=100,000 COLONIES/mL ENTEROCOCCUS FAECALIS (A)  Final   Report Status 06/26/2017 FINAL  Final   Organism ID, Bacteria ENTEROCOCCUS FAECALIS (A)  Final      Susceptibility   Enterococcus faecalis - MIC*    AMPICILLIN <=2 SENSITIVE Sensitive     LEVOFLOXACIN 1 SENSITIVE Sensitive     NITROFURANTOIN <=16 SENSITIVE Sensitive     VANCOMYCIN 2 SENSITIVE Sensitive     * >=100,000 COLONIES/mL ENTEROCOCCUS FAECALIS    Plan: Stop cephalexin. As there is no documented evidence of any urinary symptoms for this visit and AMS is likely 2/2 dementia, will treat this as asymptomatic bacteruria. No further antibiotics are necessary.  ED Provider: Dietrich PatesHina Khatri, PA-C   Roderic ScarceErin N. Zigmund Danieleja, PharmD PGY1 Pharmacy Resident Pager: [phone]

## 2017-07-06 ENCOUNTER — Other Ambulatory Visit: Payer: Self-pay | Admitting: Nurse Practitioner

## 2017-07-14 DEATH — deceased

## 2017-08-24 ENCOUNTER — Other Ambulatory Visit: Payer: Self-pay | Admitting: Nurse Practitioner

## 2017-08-28 ENCOUNTER — Ambulatory Visit: Payer: Medicare Other | Admitting: Nurse Practitioner

## 2017-09-03 ENCOUNTER — Other Ambulatory Visit (HOSPITAL_COMMUNITY): Payer: Medicare Other

## 2017-09-03 ENCOUNTER — Ambulatory Visit (HOSPITAL_COMMUNITY): Payer: Medicare Other

## 2017-10-08 ENCOUNTER — Telehealth: Payer: Self-pay | Admitting: Emergency Medicine

## 2017-10-08 NOTE — Telephone Encounter (Signed)
Lost to followup, patient is deceased
